# Patient Record
Sex: Male | Born: 1960
Health system: Southern US, Community
[De-identification: ages and names within clinical notes are randomized; demographics above are authoritative.]

## PROBLEM LIST (undated history)

## (undated) DIAGNOSIS — I4892 Unspecified atrial flutter: Secondary | ICD-10-CM

## (undated) DIAGNOSIS — I2510 Atherosclerotic heart disease of native coronary artery without angina pectoris: Secondary | ICD-10-CM

## (undated) DIAGNOSIS — I10 Essential (primary) hypertension: Secondary | ICD-10-CM

## (undated) DIAGNOSIS — I471 Supraventricular tachycardia, unspecified: Secondary | ICD-10-CM

## (undated) DIAGNOSIS — M549 Dorsalgia, unspecified: Secondary | ICD-10-CM

## (undated) DIAGNOSIS — K649 Unspecified hemorrhoids: Secondary | ICD-10-CM

## (undated) DIAGNOSIS — D369 Benign neoplasm, unspecified site: Secondary | ICD-10-CM

## (undated) DIAGNOSIS — M1990 Unspecified osteoarthritis, unspecified site: Secondary | ICD-10-CM

## (undated) DIAGNOSIS — K219 Gastro-esophageal reflux disease without esophagitis: Secondary | ICD-10-CM

## (undated) DIAGNOSIS — G8929 Other chronic pain: Secondary | ICD-10-CM

## (undated) DIAGNOSIS — I4891 Unspecified atrial fibrillation: Secondary | ICD-10-CM

## (undated) DIAGNOSIS — N183 Chronic kidney disease, stage 3 unspecified: Secondary | ICD-10-CM

## (undated) DIAGNOSIS — I425 Other restrictive cardiomyopathy: Secondary | ICD-10-CM

## (undated) DIAGNOSIS — A048 Other specified bacterial intestinal infections: Secondary | ICD-10-CM

## (undated) HISTORY — DX: Chronic kidney disease, stage 3 unspecified: N18.30

## (undated) HISTORY — DX: Supraventricular tachycardia: I47.1

## (undated) HISTORY — DX: Supraventricular tachycardia, unspecified: I47.10

## (undated) HISTORY — DX: Essential (primary) hypertension: I10

## (undated) HISTORY — PX: HERNIA REPAIR: SHX51

## (undated) HISTORY — DX: Atherosclerotic heart disease of native coronary artery without angina pectoris: I25.10

## (undated) HISTORY — DX: Other restrictive cardiomyopathy: I42.5

## (undated) HISTORY — DX: Unspecified atrial fibrillation: I48.91

## (undated) HISTORY — DX: Other specified bacterial intestinal infections: A04.8

## (undated) HISTORY — DX: Unspecified atrial flutter: I48.92

## (undated) HISTORY — DX: Benign neoplasm, unspecified site: D36.9

## (undated) HISTORY — PX: HEMORRHOID BANDING: SHX5850

## (undated) HISTORY — DX: Unspecified hemorrhoids: K64.9

---

## 2001-10-25 ENCOUNTER — Ambulatory Visit: Admission: RE | Admit: 2001-10-25 | Discharge: 2001-10-25 | Payer: Self-pay | Admitting: Internal Medicine

## 2007-10-25 ENCOUNTER — Ambulatory Visit: Payer: Self-pay | Admitting: Cardiology

## 2007-11-10 ENCOUNTER — Ambulatory Visit: Payer: Self-pay | Admitting: Cardiology

## 2008-01-23 ENCOUNTER — Inpatient Hospital Stay (HOSPITAL_COMMUNITY): Admission: AD | Admit: 2008-01-23 | Discharge: 2008-01-24 | Payer: Self-pay | Admitting: *Deleted

## 2008-01-23 ENCOUNTER — Ambulatory Visit: Payer: Self-pay | Admitting: *Deleted

## 2010-03-02 ENCOUNTER — Emergency Department (HOSPITAL_COMMUNITY): Admission: EM | Admit: 2010-03-02 | Discharge: 2010-03-02 | Payer: Self-pay | Admitting: Emergency Medicine

## 2010-06-09 ENCOUNTER — Encounter (HOSPITAL_COMMUNITY): Payer: Self-pay | Admitting: Radiology

## 2010-06-09 ENCOUNTER — Emergency Department (HOSPITAL_COMMUNITY): Payer: 59

## 2010-06-09 ENCOUNTER — Emergency Department (HOSPITAL_COMMUNITY)
Admission: EM | Admit: 2010-06-09 | Discharge: 2010-06-09 | Disposition: A | Discharge status: Home / Self Care | Payer: 59 | Attending: Emergency Medicine | Admitting: Emergency Medicine

## 2010-06-09 DIAGNOSIS — R0989 Other specified symptoms and signs involving the circulatory and respiratory systems: Secondary | ICD-10-CM | POA: Insufficient documentation

## 2010-06-09 DIAGNOSIS — R0789 Other chest pain: Secondary | ICD-10-CM | POA: Insufficient documentation

## 2010-06-09 DIAGNOSIS — R0609 Other forms of dyspnea: Secondary | ICD-10-CM | POA: Insufficient documentation

## 2010-06-09 DIAGNOSIS — J40 Bronchitis, not specified as acute or chronic: Secondary | ICD-10-CM | POA: Insufficient documentation

## 2010-06-09 DIAGNOSIS — R05 Cough: Secondary | ICD-10-CM | POA: Insufficient documentation

## 2010-06-09 DIAGNOSIS — R059 Cough, unspecified: Secondary | ICD-10-CM | POA: Insufficient documentation

## 2010-06-09 LAB — CK TOTAL AND CKMB (NOT AT ARMC)
CK, MB: 1 ng/mL (ref 0.3–4.0)
Relative Index: 0.5 (ref 0.0–2.5)
Total CK: 186 U/L (ref 7–232)

## 2010-06-09 LAB — BASIC METABOLIC PANEL
BUN: 16 mg/dL (ref 6–23)
CO2: 27 mEq/L (ref 19–32)
Calcium: 9.7 mg/dL (ref 8.4–10.5)
Chloride: 100 mEq/L (ref 96–112)
Creatinine, Ser: 1.29 mg/dL (ref 0.4–1.5)
GFR calc Af Amer: >60 mL/min (ref >60)
GFR calc non Af Amer: 59 mL/min — ABNORMAL LOW (ref >60)
Glucose, Bld: 98 mg/dL (ref 70–99)
Potassium: 4.2 mEq/L (ref 3.5–5.1)
Sodium: 137 mEq/L (ref 135–145)

## 2010-06-09 LAB — DIFFERENTIAL
Basophils Absolute: 0 10*3/uL (ref 0.0–0.1)
Basophils Relative: 0 % (ref 0–1)
Eosinophils Absolute: 0.3 10*3/uL (ref 0.0–0.7)
Eosinophils Relative: 4 % (ref 0–5)
Lymphocytes Relative: 31 % (ref 12–46)
Lymphs Abs: 1.9 10*3/uL (ref 0.7–4.0)
Monocytes Absolute: 0.5 10*3/uL (ref 0.1–1.0)
Monocytes Relative: 8 % (ref 3–12)
Neutro Abs: 3.5 10*3/uL (ref 1.7–7.7)
Neutrophils Relative %: 57 % (ref 43–77)

## 2010-06-09 LAB — CBC
HCT: 44.7 % (ref 39.0–52.0)
Hemoglobin: 15 g/dL (ref 13.0–17.0)
MCH: 29.9 pg (ref 26.0–34.0)
MCHC: 33.6 g/dL (ref 30.0–36.0)
MCV: 89.2 fL (ref 78.0–100.0)
Platelets: 298 10*3/uL (ref 150–400)
RBC: 5.01 MIL/uL (ref 4.22–5.81)
RDW: 15.3 % (ref 11.5–15.5)
WBC: 6.2 10*3/uL (ref 4.0–10.5)

## 2010-06-09 LAB — TROPONIN I: Troponin I: 0.01 ng/mL (ref 0.00–0.06)

## 2010-06-23 ENCOUNTER — Emergency Department (HOSPITAL_COMMUNITY)
Admission: EM | Admit: 2010-06-23 | Discharge: 2010-06-23 | Disposition: A | Discharge status: Home / Self Care | Payer: 59 | Attending: Emergency Medicine | Admitting: Emergency Medicine

## 2010-06-23 DIAGNOSIS — E785 Hyperlipidemia, unspecified: Secondary | ICD-10-CM | POA: Insufficient documentation

## 2010-06-23 DIAGNOSIS — L909 Atrophic disorder of skin, unspecified: Secondary | ICD-10-CM | POA: Insufficient documentation

## 2010-06-23 DIAGNOSIS — L919 Hypertrophic disorder of the skin, unspecified: Secondary | ICD-10-CM | POA: Insufficient documentation

## 2010-06-23 DIAGNOSIS — K625 Hemorrhage of anus and rectum: Secondary | ICD-10-CM | POA: Insufficient documentation

## 2010-06-23 DIAGNOSIS — M549 Dorsalgia, unspecified: Secondary | ICD-10-CM | POA: Insufficient documentation

## 2010-06-23 DIAGNOSIS — G8929 Other chronic pain: Secondary | ICD-10-CM | POA: Insufficient documentation

## 2010-06-23 LAB — POCT I-STAT, CHEM 8
BUN: 13 mg/dL (ref 6–23)
Calcium, Ion: 1.12 mmol/L (ref 1.12–1.32)
Chloride: 108 mEq/L (ref 96–112)
Creatinine, Ser: 1.5 mg/dL (ref 0.4–1.5)
Glucose, Bld: 82 mg/dL (ref 70–99)
HCT: 44 % (ref 39.0–52.0)
Hemoglobin: 15 g/dL (ref 13.0–17.0)
Potassium: 4.1 mEq/L (ref 3.5–5.1)
Sodium: 141 mEq/L (ref 135–145)
TCO2: 23 mmol/L (ref 0–100)

## 2010-06-24 LAB — COMPREHENSIVE METABOLIC PANEL
ALT: 20 U/L (ref 0–53)
AST: 32 U/L (ref 0–37)
Albumin: 3.7 g/dL (ref 3.5–5.2)
Alkaline Phosphatase: 50 U/L (ref 39–117)
BUN: 10 mg/dL (ref 6–23)
CO2: 25 mEq/L (ref 19–32)
Calcium: 9.1 mg/dL (ref 8.4–10.5)
Chloride: 105 mEq/L (ref 96–112)
Creatinine, Ser: 1.26 mg/dL (ref 0.4–1.5)
GFR calc Af Amer: >60 mL/min (ref >60)
GFR calc non Af Amer: >60 mL/min (ref >60)
Glucose, Bld: 111 mg/dL — ABNORMAL HIGH (ref 70–99)
Potassium: 3.7 mEq/L (ref 3.5–5.1)
Sodium: 139 mEq/L (ref 135–145)
Total Bilirubin: 0.5 mg/dL (ref 0.3–1.2)
Total Protein: 7.3 g/dL (ref 6.0–8.3)

## 2010-06-24 LAB — CBC
HCT: 41.5 % (ref 39.0–52.0)
Hemoglobin: 13.9 g/dL (ref 13.0–17.0)
MCH: 30.6 pg (ref 26.0–34.0)
MCHC: 33.5 g/dL (ref 30.0–36.0)
MCV: 91.2 fL (ref 78.0–100.0)
Platelets: 244 10*3/uL (ref 150–400)
RBC: 4.55 MIL/uL (ref 4.22–5.81)
RDW: 15 % (ref 11.5–15.5)
WBC: 6 10*3/uL (ref 4.0–10.5)

## 2010-06-24 LAB — DIFFERENTIAL
Basophils Absolute: 0.1 10*3/uL (ref 0.0–0.1)
Basophils Relative: 1 % (ref 0–1)
Eosinophils Absolute: 0.3 10*3/uL (ref 0.0–0.7)
Eosinophils Relative: 5 % (ref 0–5)
Lymphocytes Relative: 30 % (ref 12–46)
Lymphs Abs: 1.8 10*3/uL (ref 0.7–4.0)
Monocytes Absolute: 0.5 10*3/uL (ref 0.1–1.0)
Monocytes Relative: 8 % (ref 3–12)
Neutro Abs: 3.4 10*3/uL (ref 1.7–7.7)
Neutrophils Relative %: 56 % (ref 43–77)

## 2010-06-24 LAB — URINALYSIS, ROUTINE W REFLEX MICROSCOPIC
Bilirubin Urine: NEGATIVE
Glucose, UA: NEGATIVE mg/dL
Hgb urine dipstick: NEGATIVE
Ketones, ur: NEGATIVE mg/dL
Nitrite: NEGATIVE
Protein, ur: NEGATIVE mg/dL
Specific Gravity, Urine: 1.01 (ref 1.005–1.030)
Urobilinogen, UA: 0.2 mg/dL (ref 0.0–1.0)
pH: 6.5 (ref 5.0–8.0)

## 2010-06-27 LAB — POCT I-STAT, CHEM 8
BUN: 13 mg/dL (ref 6–23)
Calcium, Ion: 1.15 mmol/L (ref 1.12–1.32)
Chloride: 108 mEq/L (ref 96–112)
Creatinine, Ser: 1.5 mg/dL (ref 0.4–1.5)
Glucose, Bld: 89 mg/dL (ref 70–99)
HCT: 44 % (ref 39.0–52.0)
Hemoglobin: 15 g/dL (ref 13.0–17.0)
Potassium: 4.1 mEq/L (ref 3.5–5.1)
Sodium: 140 mEq/L (ref 135–145)
TCO2: 24 mmol/L (ref 0–100)

## 2010-06-30 ENCOUNTER — Ambulatory Visit (INDEPENDENT_AMBULATORY_CARE_PROVIDER_SITE_OTHER): Payer: Commercial Indemnity | Admitting: Urgent Care

## 2010-06-30 ENCOUNTER — Encounter: Payer: Self-pay | Admitting: Urgent Care

## 2010-06-30 DIAGNOSIS — K625 Hemorrhage of anus and rectum: Secondary | ICD-10-CM | POA: Insufficient documentation

## 2010-06-30 DIAGNOSIS — K219 Gastro-esophageal reflux disease without esophagitis: Secondary | ICD-10-CM

## 2010-06-30 DIAGNOSIS — R1319 Other dysphagia: Secondary | ICD-10-CM | POA: Insufficient documentation

## 2010-07-10 NOTE — Assessment & Plan Note (Signed)
Summary: rectal bleed,consult for tcs   Vital Signs:  Patient profile:   50 year old male Height:      66 inches Weight:      252.50 pounds BMI:     40.90 Temp:     98.6 degrees F oral Pulse rate:   84 / minute BP sitting:   112 / 58  (left arm)  Vitals Entered By: Carolan Clines LPN (June 30, 2010 11:17 AM)  Visit Type:  Initial Consult Referring Provider:  Tempie Donning, [facility] Primary Care Provider:  [place]  Chief Complaint:  rectal bleeding.  History of Present Illness: 50 y/o black male w/ light red blood in small amt of stool & on toilet paper x 2days last week.  Also had similar  episode 2 mo ago.  Occ incomplete evacuation.  Denies constipation or diarrhea.  Aleve once daily as needed.  Takes baby ASA daily.  c/o hiccoughs, heartburn, indigestion w/ nocturnal symptoms x 1 yr.  Started prilosec last week, some difference.  Denies nausea, vomiting.  Wt gain a few #'s.  c/o pill dysphagia x 3-4 mo.  Feels like stuck in upper esophagus.  Denies any problems w/ liqs.    Current Medications (verified): 1)  Aspir-Low 81 Mg Tbec (Aspirin) .... Take One Once Daily 2)  Exforge Hct 10-160-12.5 Mg Tabs (Amlodipine-Valsartan-Hctz) .... Take One Once Daily 3)  Tricor 48 Mg Tabs (Fenofibrate) .... Take One Once Daily 4)  Prilosec 20 Mg Cpdr (Omeprazole) .... Take One Once Daily  Allergies (verified): No Known Drug Allergies  Past History:  Past Medical History: htn  Past Surgical History: Unremarkable  Family History: No known family history of colorectal carcinoma, IBD, liver or chronic GI problems. Father: (deceased 19)  ALS Mother: (49's)?  Siblings: 7 -healthy  Social History: engaged to be married 07/2010 divorced, married x 1 no children [employer] Patient has never smoked.  Alcohol Use - yes, 1-2 beers/day, occ liquor 2x/week Illicit Drug Use - past marijuana, none in 1 mo Smoking Status:  never Drug Use:  yes  Review of Systems General:  Denies fever, chills,  sweats, anorexia, fatigue, weakness, malaise, weight loss, and sleep disorder. CV:  Denies chest pains, angina, palpitations, syncope, dyspnea on exertion, orthopnea, PND, peripheral edema, and claudication. Resp:  Complains of dyspnea with exercise; denies dyspnea at rest, cough, sputum, wheezing, coughing up blood, and pleurisy. GI:  Denies jaundice and fecal incontinence. GU:  Complains of urinary frequency; denies urinary burning, blood in urine, urinary hesitancy, nocturnal urination, and urinary incontinence. MS:  Complains of low back pain. Derm:  Denies rash, itching, dry skin, hives, moles, warts, and unhealing ulcers. Psych:  Denies depression, anxiety, memory loss, suicidal ideation, hallucinations, paranoia, phobia, and confusion. Heme:  Denies bruising and enlarged lymph nodes.  Physical Exam  General:  Obese, Well developed, no acute distress. Head:  Normocephalic and atraumatic. Eyes:  Sclera clear,  no icterus. Ears:  Normal auditory acuity. Nose:  No deformity, discharge,  or lesions. Mouth:  No deformity or lesions, dentition normal. Neck:  Supple; no masses or thyromegaly. Lungs:  Clear throughout to auscultation. Heart:  Regular rate and rhythm; no murmurs, rubs,  or bruits. Abdomen:  Soft, nontender and nondistended. No masses, hepatosplenomegaly or hernias noted. Normal bowel sounds. no rebound tenderness or guarding. Rectal:  deferred until time of colonoscopy.   Msk:  Symmetrical with no gross deformities. Normal posture. Pulses:  Normal pulses noted. Extremities:  No clubbing, cyanosis, edema or deformities noted.  Neurologic:  Alert and  oriented x4;  grossly normal neurologically. Skin:  Intact without significant lesions or rashes. Cervical Nodes:  No significant cervical adenopathy. Psych:  Alert and cooperative. Normal mood and affect.   Impression & Recommendations:  Problem # 1:  RECTAL BLEEDING (ICD-41.79)  50 year old black male with intermittent  small volume hematochezia.    He will need colonoscopy to further sort things out.  Differentials include colorectal carcinoma, diverticular bleeding, benign anorectal source, such as hemorrhoid or fissure.   He is going to be scheduled for colonoscopy and EGD with possible esophageal dilatation with Dr. Jena Gauss in the OR given his daily alcohol use.I have discussed this procedure including risk and benefits, which include but are not limited to, bleeding, infection, perforation, and drug reaction.   He agrees with this plan and consent will be obtained. Orders: Consultation Level IV (16109)  Problem # 2:  GERD (ICD-530.81) New onset acid reflux in a 50 year old black male.   This is accompanied by pill-dysphasia. He is going to need further evaluation with EGD to rule out esophageal web, ring, or stricture or complicated gastroesophageal reflux disease.  Problem # 3:  OTHER DYSPHAGIA (ICD-787.29) See #2  Patient Instructions: 1)   continue Prilosec 20 mg daily  2)   To ER if you have severe abdominal pain, bleeding, or any worsening  symptoms.   Orders Added: 1)  Consultation Level IV [60454]

## 2010-07-16 ENCOUNTER — Encounter (HOSPITAL_COMMUNITY): Payer: 59 | Attending: Internal Medicine

## 2010-07-23 ENCOUNTER — Encounter: Payer: 59 | Admitting: Internal Medicine

## 2010-07-29 ENCOUNTER — Emergency Department (HOSPITAL_COMMUNITY): Payer: 59

## 2010-07-29 ENCOUNTER — Emergency Department (HOSPITAL_COMMUNITY)
Admission: EM | Admit: 2010-07-29 | Discharge: 2010-07-29 | Disposition: A | Discharge status: Home / Self Care | Payer: 59 | Attending: Emergency Medicine | Admitting: Emergency Medicine

## 2010-07-29 DIAGNOSIS — R0989 Other specified symptoms and signs involving the circulatory and respiratory systems: Secondary | ICD-10-CM | POA: Insufficient documentation

## 2010-07-29 DIAGNOSIS — R059 Cough, unspecified: Secondary | ICD-10-CM | POA: Insufficient documentation

## 2010-07-29 DIAGNOSIS — R05 Cough: Secondary | ICD-10-CM | POA: Insufficient documentation

## 2010-07-29 DIAGNOSIS — H6500 Acute serous otitis media, unspecified ear: Secondary | ICD-10-CM | POA: Insufficient documentation

## 2010-07-29 DIAGNOSIS — H9209 Otalgia, unspecified ear: Secondary | ICD-10-CM | POA: Insufficient documentation

## 2010-08-06 ENCOUNTER — Telehealth: Payer: Self-pay | Admitting: Internal Medicine

## 2010-08-06 ENCOUNTER — Encounter (HOSPITAL_COMMUNITY): Payer: 59 | Attending: Internal Medicine

## 2010-08-06 NOTE — Telephone Encounter (Addendum)
Gastroenterology Pre-Procedure Form  Request Date: 08/06/2010,  Requesting Physician:      PATIENT INFORMATION:  Calvin Henry is a 50 y.o., male (DOB=1960/11/13).  PROCEDURE: Procedure(s) requested: colonoscopy,EGD Procedure Reason: rectal bleeding,DYSPHAGIA,GERD  PATIENT REVIEW QUESTIONS: The patient reports the following:   1. Diabetes Melitis: no 2. Joint replacements in the past 12 months: no 3. Major health problems in the past 3 months: no 4. Has an artificial valve or MVP:no 5. Has been advised in past to take antibiotics in advance of a procedure like teeth cleaning: no}    MEDICATIONS & ALLERGIES:    Patient reports the following regarding taking any blood thinners:   Plavix? no Aspirin?yes 81 MG Coumadin?  no  Patient confirms/reports the following medications:  Current Outpatient Prescriptions  Medication Sig Dispense Refill  . Amlodipine-Valsartan-HCTZ (EXFORGE HCT) 10-160-12.5 MG TABS Take by mouth daily.        Marland Kitchen aspirin 81 MG tablet Take 81 mg by mouth daily.        . fenofibrate (TRICOR) 48 MG tablet Take 48 mg by mouth daily.        Marland Kitchen omeprazole (PRILOSEC) 20 MG capsule Take 20 mg by mouth daily.          Patient confirms/reports the following allergies:  No Known Allergies  Patient is appropriate to schedule for requested procedure(s): yes     SCHEDULE INFORMATION: Procedure has been scheduled as follows:  Date: 08/07/2010  Location: [facility]  This Gastroenterology Pre-Precedure Form is being routed to the following provider(s) for review: Tana Coast, PA

## 2010-08-06 NOTE — Telephone Encounter (Signed)
Okay to proceed with EGD and colonoscopy. No change in medications.  Please note procedures are to be done with deep sedation in the OR due to history of daily alcohol abuse.

## 2010-08-07 ENCOUNTER — Other Ambulatory Visit: Payer: Self-pay | Admitting: Internal Medicine

## 2010-08-07 ENCOUNTER — Ambulatory Visit (HOSPITAL_COMMUNITY)
Admission: RE | Admit: 2010-08-07 | Discharge: 2010-08-07 | Disposition: A | Discharge status: Home / Self Care | Payer: 59 | Source: Ambulatory Visit | Attending: Internal Medicine | Admitting: Internal Medicine

## 2010-08-07 ENCOUNTER — Encounter: Payer: Self-pay | Admitting: Internal Medicine

## 2010-08-07 DIAGNOSIS — Z79899 Other long term (current) drug therapy: Secondary | ICD-10-CM | POA: Insufficient documentation

## 2010-08-07 DIAGNOSIS — K222 Esophageal obstruction: Secondary | ICD-10-CM

## 2010-08-07 DIAGNOSIS — K921 Melena: Secondary | ICD-10-CM | POA: Insufficient documentation

## 2010-08-07 DIAGNOSIS — I10 Essential (primary) hypertension: Secondary | ICD-10-CM | POA: Insufficient documentation

## 2010-08-07 DIAGNOSIS — K2940 Chronic atrophic gastritis without bleeding: Secondary | ICD-10-CM | POA: Insufficient documentation

## 2010-08-07 DIAGNOSIS — Z7982 Long term (current) use of aspirin: Secondary | ICD-10-CM | POA: Insufficient documentation

## 2010-08-07 DIAGNOSIS — R1310 Dysphagia, unspecified: Secondary | ICD-10-CM | POA: Insufficient documentation

## 2010-08-07 HISTORY — PX: ESOPHAGOGASTRODUODENOSCOPY: SHX1529

## 2010-08-07 HISTORY — PX: COLONOSCOPY: SHX174

## 2010-08-14 NOTE — Op Note (Signed)
Calvin Henry, FURCHES            ACCOUNT NO.:  [id number]  MEDICAL RECORD NO.:  [id number]           PATIENT TYPE:  O  LOCATION:  DAYP                          FACILITY:  [facility]  PHYSICIAN:  R. Roetta Sessions, M.D. DATE OF BIRTH:  1960-08-02  DATE OF PROCEDURE:  08/07/2010 DATE OF DISCHARGE:  08/07/2010                              OPERATIVE REPORT   INDICATIONS FOR PROCEDURE:  A 50 year old African American male with intermittent hematochezia, longstanding GERD, and intermittent esophageal dysphagia to solids.  EGD and colonoscopy now being done. Risks, benefits, limitations, alternatives, imponderables have been discussed, questions answered.  He will require deep sedation with propofol.  Please see his history and physical exam for more information.  PROCEDURE NOTE:  O2 saturation, blood pressure, pulse, respirations monitored throughout the entire procedure.  Deep sedation per Dr. Jayme Cloud and associates.  Cetacaine spray for topical pharyngeal anesthesia.  INSTRUMENT:  Pentax video chip system.  EGD FINDINGS:  Examination of the tubular esophagus revealed a Schatzki ring, otherwise esophageal mucosa appeared normal.  EG junction easily traversed.  Examination of the stomach:  Gas cavity was emptied, insufflated well with air.  Thorough examination of the gastric mucosa including retroflexion in the proximal stomach and esophagogastric junction demonstrated small hiatal hernia and submucosal petechiae. There was no ulcer infiltrating process or other abnormality.  Pylorus is patent, easily traversed.  Examination of the bulb and second portion revealed no abnormalities.  THERAPEUTIC/DIAGNOSTIC MANEUVERS PERFORMED:  Scope was withdrawn and a 56-French Maloney dilator was passed in full insertion with ease look back revealed no apparent complication with no passage of the dilator. Subsequent biopsies of the stomach were taken for histologic study to rule out H. Pylori.  The  patient tolerated the procedure and was prepared for colonoscopy.  Digital rectal exam revealed no abnormalities.  ENDOSCOPIC FINDINGS:  Prep was adequate.  Colon:  Colonic mucosa was surveyed from the rectosigmoid junction through the left transverse right colon to the appendiceal orifice, ileocecal valve/cecum.  These structures were well seen and photographed for the record.  From this level, scope was slowly and cautiously withdrawn.  All previously mentioned mucosal surfaces were again seen.  The colonic mucosa appeared normal.  Scope was pulled down to the rectum where a thorough examination of rectal mucosa including retroflexed view of the anal verge demonstrated no abnormalities.  An en face view of the anal canal did demonstrate friable anal canal only.  The patient tolerated this procedure well also with a cecal withdrawal time 13 minutes.  IMPRESSION: 1. EGD and Schatzki ring, otherwise normal esophagus status post     dilation as described above. 2. Small hiatal hernia. 3. Diffuse submucosal gastric petechiae of uncertain significance     status post biopsy, patent pylorus, normal D1-D2.  COLONOSCOPY FINDINGS:  Friable anal canal, otherwise normal rectum and colon.  RECOMMENDATIONS: 1. Acid suppression therapy. 2. Follow up on path. 3. Further recommendations to follow.     [name], M.D.     RMR/MEDQ  D:  08/13/2010  T:  08/13/2010  Job:  [id number]  Electronically Signed by Lorrin Goodell M.D. on 08/14/2010 03:48:34 PM

## 2010-08-18 ENCOUNTER — Emergency Department (HOSPITAL_COMMUNITY): Payer: 59

## 2010-08-18 ENCOUNTER — Emergency Department (HOSPITAL_COMMUNITY)
Admission: EM | Admit: 2010-08-18 | Discharge: 2010-08-18 | Disposition: A | Discharge status: Home / Self Care | Payer: 59 | Attending: Emergency Medicine | Admitting: Emergency Medicine

## 2010-08-18 DIAGNOSIS — R002 Palpitations: Secondary | ICD-10-CM | POA: Insufficient documentation

## 2010-08-18 DIAGNOSIS — R079 Chest pain, unspecified: Secondary | ICD-10-CM | POA: Insufficient documentation

## 2010-08-18 DIAGNOSIS — E785 Hyperlipidemia, unspecified: Secondary | ICD-10-CM | POA: Insufficient documentation

## 2010-08-18 DIAGNOSIS — K219 Gastro-esophageal reflux disease without esophagitis: Secondary | ICD-10-CM | POA: Insufficient documentation

## 2010-08-18 DIAGNOSIS — M549 Dorsalgia, unspecified: Secondary | ICD-10-CM | POA: Insufficient documentation

## 2010-08-18 LAB — POCT I-STAT, CHEM 8
BUN: 15 mg/dL (ref 6–23)
Calcium, Ion: 1 mmol/L — ABNORMAL LOW (ref 1.12–1.32)
Chloride: 109 mEq/L (ref 96–112)
Creatinine, Ser: 1.3 mg/dL (ref 0.4–1.5)
Glucose, Bld: 91 mg/dL (ref 70–99)
HCT: 44 % (ref 39.0–52.0)
Hemoglobin: 15 g/dL (ref 13.0–17.0)
Potassium: 3.9 mEq/L (ref 3.5–5.1)
Sodium: 139 mEq/L (ref 135–145)
TCO2: 25 mmol/L (ref 0–100)

## 2010-08-18 LAB — POCT CARDIAC MARKERS
CKMB, poc: <1 ng/mL — ABNORMAL LOW (ref 1.0–8.0)
Myoglobin, poc: 76.2 ng/mL (ref 12–200)
Troponin i, poc: <0.05 ng/mL (ref 0.00–0.09)

## 2010-08-26 NOTE — Assessment & Plan Note (Signed)
[facility]                          [facility] OFFICE NOTE   Calvin Henry, Calvin Henry                     MRN:          [id number]  DATE:11/10/2007                            DOB:          06/24/1960    PRIMARY CARE PHYSICIAN:  Dr. Lia Hopping.   REASON FOR VISIT:  Followup cardiac testing.   HISTORY OF PRESENT ILLNESS:  Calvin Henry is a 50 year old gentleman  with a history of hypertension who was admitted to [facility] back in mid July with chest discomfort.  He was ruled out from  myocardial infarction and we saw him in consultation with  recommendations for a followup exercise echocardiogram.  This procedure  was done on October 27, 2007 and read by Dr. Myrtis Ser revealing no  electrocardiographic changes to indicate ischemia and no inducible wall  motion abnormalities to indicate ischemia.  I have reviewed this with  him today.  He states that overall he has done fairly well.  His  electrocardiogram shows sinus rhythm with left ventricle hypertrophy and  decreased R-wave progression anteriorly.  His blood pressure looks very  good today.  I spoke with him about general risk factor modifications  and he will continue to follow up with Dr. Olena Leatherwood.   ALLERGIES:  No known drug allergies.   PRESENT MEDICATIONS:  1. Exforge 10/160 mg p.o. daily.  2. Aspirin 325 mg p.o. daily.   REVIEW OF SYSTEMS:  As described in the history of present illness,  otherwise negative.   PHYSICAL EXAMINATION:  VITAL SIGNS:  Blood pressure 120/74, heart rate  is 70, and weight is 242 pounds.  NECK:  Supple.  No elevated jugular venous pressure.  LUNGS:  Clear without labored breathing.  CARDIAC:  Regular rate and rhythm.  No loud murmur or gallop.   IMPRESSION AND RECOMMENDATIONS:  Normal exercise echocardiogram  suggesting very low likelihood of underlying obstructive cardiovascular  disease.  We would recommend general risk factor modification  including  control of blood pressure and follow up of lipid status.  He will  continue to follow with Dr. Olena Leatherwood and we can see him back as needed.     Jonelle Sidle, MD  Electronically Signed   SGM/MedQ  DD: 11/10/2007  DT: 11/11/2007  Job #: [id number]   cc:   Lia Hopping

## 2010-08-26 NOTE — Discharge Summary (Signed)
NAMEJENS, SIEMS NO.:  [id number]   MEDICAL RECORD NO.:  [id number]          PATIENT TYPE:  IPS   LOCATION:  0304                          FACILITY:  [facility]   PHYSICIAN:  Jasmine Pang, M.D. DATE OF BIRTH:  1960-11-12   DATE OF ADMISSION:  01/23/2008  DATE OF DISCHARGE:  01/24/2008                               DISCHARGE SUMMARY   IDENTIFYING INFORMATION:  This is a 50 year old single African American  male who was admitted on a voluntary basis on January 23, 2008.   HISTORY OF PRESENT ILLNESS:  The patient states he took an overdose of 4  to 5 pills of his MOBIC.  He then told the emergency department that he  had taken 15 to 20.  He stated this was real crazy.  He had a number  of stressors and had been trying to borrow money from his sister.  He  took the pills out of anger towards her when she was unable to lend him  some money.  Other stressors include losing a job though he just got a  job back, which does not pay as well.  He is having financial issues.  He states that he had one beer the day he took the pills, but no more.  Sleep is decreased.  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   PAST PSYCHIATRIC HISTORY:  This is the first [facility] inpatient admission for  the patient.  He has no current psychiatric problems.   ALCOHOL AND DRUG HISTORY:  Occasional beer.  He denies any other  substance abuse.   MEDICAL PROBLEMS:  Hypertension.   MEDICATIONS:  Exforge.     The patient's physical exam from [facility] was reviewed.  There were no acute physical or medical problems noted except for some  abdominal tenderness and distention.   ADMISSION LABORATORY:  CBC was within normal limits.  Urinalysis was  negative.  Alcohol level was 140.  Salicylate level less than 4.  Acetaminophen level less than 10.   HOSPITAL COURSE:  Upon admission, the patient was restarted on Exforge  10/320 mg 1 tablet daily.  He states he was very embarrassed about  having taken the overdose.  He denies he is suicidal and states he never  was.  He wanted to draw attention to the fact that he needed financial  help.  He took only 4 to 5 MOBIC pills, but told the emergency  department that he had taken 15 to 20.  He stated he was doing this to  get attention.  He discussed his multiple stressors including financial  problems.  He also recently gotten laid off, but was just called back to  work, which he feels better about.  He did not want to stay in the  hospital.  His sister was contacted and she did not feel he needed to be  in the hospital either.  She did not feel he was a danger to himself.  Mood was less depressed, less anxious.  Affect consistent with mood.  There was  no suicidal or homicidal ideation.  No thoughts of self-  injurious behavior.  No auditory or visual hallucinations.  No paranoia  or delusions.  Thoughts were logical and goal-directed, thought content.  No predominant theme.  Cognitive was grossly intact.  Insight good,  judgment good, impulse control good.  It was felt the patient was safe  for discharge today and his family agreed.   DISCHARGE DIAGNOSES:  Axis I:  Depressive disorder not otherwise  specified.  Axis II:  None.  Axis III:  Hypertension.  Axis IV:  Severe (problems related to social environment, occupational  problem, economic problem, and burden of psychiatric illness).  Axis V:  Global assessment of functioning was 50 upon discharge.  GAF  was 45 upon admission.  GAF highest past year was 70.   DISCHARGE PLAN:  There was no specific activity level or dietary  restrictions.   POSTHOSPITAL CARE PLANS:  The patient did not want any psychiatric  followup.   DISCHARGE MEDICATIONS:  Exforge 10/320 mg 1 tablets daily.  He is also  to follow up with his primary care physician, Dr. Bradly Bienenstock about resuming  his Ultram.      Jasmine Pang,  M.D.  Electronically Signed     BHS/MEDQ  D:  01/24/2008  T:  01/25/2008  Job:  [id number]

## 2010-09-11 ENCOUNTER — Other Ambulatory Visit (HOSPITAL_COMMUNITY): Payer: Self-pay | Admitting: Family Medicine

## 2010-09-11 DIAGNOSIS — M25569 Pain in unspecified knee: Secondary | ICD-10-CM

## 2010-09-15 ENCOUNTER — Ambulatory Visit (HOSPITAL_COMMUNITY)
Admission: RE | Admit: 2010-09-15 | Discharge: 2010-09-15 | Disposition: A | Discharge status: Home / Self Care | Payer: 59 | Source: Ambulatory Visit | Attending: Family Medicine | Admitting: Family Medicine

## 2010-09-15 DIAGNOSIS — M25569 Pain in unspecified knee: Secondary | ICD-10-CM

## 2010-09-15 DIAGNOSIS — M79609 Pain in unspecified limb: Secondary | ICD-10-CM | POA: Insufficient documentation

## 2011-04-21 ENCOUNTER — Emergency Department (HOSPITAL_COMMUNITY)
Admission: EM | Admit: 2011-04-21 | Discharge: 2011-04-21 | Disposition: A | Discharge status: Home / Self Care | Payer: 59

## 2011-04-21 ENCOUNTER — Encounter (HOSPITAL_COMMUNITY): Payer: Self-pay | Admitting: *Deleted

## 2011-04-21 NOTE — ED Notes (Signed)
Rt flank pain.  Seen by MD for same .

## 2011-04-21 NOTE — ED Notes (Signed)
Pt left w/o being seen

## 2011-04-22 ENCOUNTER — Encounter (HOSPITAL_COMMUNITY): Payer: Self-pay | Admitting: Emergency Medicine

## 2011-04-22 ENCOUNTER — Emergency Department (HOSPITAL_COMMUNITY): Payer: 59

## 2011-04-22 ENCOUNTER — Emergency Department (HOSPITAL_COMMUNITY)
Admission: EM | Admit: 2011-04-22 | Discharge: 2011-04-22 | Disposition: A | Discharge status: Home / Self Care | Payer: 59 | Attending: Emergency Medicine | Admitting: Emergency Medicine

## 2011-04-22 DIAGNOSIS — X58XXXA Exposure to other specified factors, initial encounter: Secondary | ICD-10-CM | POA: Insufficient documentation

## 2011-04-22 DIAGNOSIS — R319 Hematuria, unspecified: Secondary | ICD-10-CM | POA: Insufficient documentation

## 2011-04-22 DIAGNOSIS — I10 Essential (primary) hypertension: Secondary | ICD-10-CM | POA: Insufficient documentation

## 2011-04-22 DIAGNOSIS — S335XXA Sprain of ligaments of lumbar spine, initial encounter: Secondary | ICD-10-CM | POA: Insufficient documentation

## 2011-04-22 DIAGNOSIS — M549 Dorsalgia, unspecified: Secondary | ICD-10-CM | POA: Insufficient documentation

## 2011-04-22 DIAGNOSIS — R109 Unspecified abdominal pain: Secondary | ICD-10-CM | POA: Insufficient documentation

## 2011-04-22 DIAGNOSIS — Z87891 Personal history of nicotine dependence: Secondary | ICD-10-CM | POA: Insufficient documentation

## 2011-04-22 DIAGNOSIS — K219 Gastro-esophageal reflux disease without esophagitis: Secondary | ICD-10-CM | POA: Insufficient documentation

## 2011-04-22 DIAGNOSIS — S39012A Strain of muscle, fascia and tendon of lower back, initial encounter: Secondary | ICD-10-CM

## 2011-04-22 DIAGNOSIS — G8929 Other chronic pain: Secondary | ICD-10-CM | POA: Insufficient documentation

## 2011-04-22 HISTORY — DX: Dorsalgia, unspecified: M54.9

## 2011-04-22 HISTORY — DX: Gastro-esophageal reflux disease without esophagitis: K21.9

## 2011-04-22 HISTORY — DX: Other chronic pain: G89.29

## 2011-04-22 LAB — URINALYSIS, ROUTINE W REFLEX MICROSCOPIC
Bilirubin Urine: NEGATIVE
Glucose, UA: NEGATIVE mg/dL
Hgb urine dipstick: NEGATIVE
Ketones, ur: NEGATIVE mg/dL
Leukocytes, UA: NEGATIVE
Nitrite: NEGATIVE
Protein, ur: NEGATIVE mg/dL
Specific Gravity, Urine: 1.015 (ref 1.005–1.030)
Urobilinogen, UA: 0.2 mg/dL (ref 0.0–1.0)
pH: 6 (ref 5.0–8.0)

## 2011-04-22 LAB — BASIC METABOLIC PANEL
BUN: 17 mg/dL (ref 6–23)
CO2: 28 mEq/L (ref 19–32)
Calcium: 9.9 mg/dL (ref 8.4–10.5)
Chloride: 105 mEq/L (ref 96–112)
Creatinine, Ser: 1.17 mg/dL (ref 0.50–1.35)
GFR calc Af Amer: 82 mL/min — ABNORMAL LOW (ref >90)
GFR calc non Af Amer: 71 mL/min — ABNORMAL LOW (ref >90)
Glucose, Bld: 101 mg/dL — ABNORMAL HIGH (ref 70–99)
Potassium: 4.5 mEq/L (ref 3.5–5.1)
Sodium: 139 mEq/L (ref 135–145)

## 2011-04-22 MED ORDER — KETOROLAC TROMETHAMINE 30 MG/ML IJ SOLN
30.0000 mg | Freq: Once | INTRAMUSCULAR | Status: AC
  Administered 2011-04-22: 30 mg via INTRAVENOUS
  Filled 2011-04-22: qty 1

## 2011-04-22 MED ORDER — NAPROXEN 500 MG PO TABS: 500.0000 mg | ORAL_TABLET | Freq: Two times a day (BID) | ORAL | Status: DC

## 2011-04-22 MED ORDER — SODIUM CHLORIDE 0.9 % IV SOLN
INTRAVENOUS | Status: DC
  Administered 2011-04-22: 1000 mL via INTRAVENOUS

## 2011-04-22 MED ORDER — CYCLOBENZAPRINE HCL 10 MG PO TABS: 10.0000 mg | ORAL_TABLET | Freq: Two times a day (BID) | ORAL | Status: AC | PRN

## 2011-04-22 NOTE — ED Notes (Addendum)
Patient c/o right flank pain x1 month. Denies any nausea or vomiting. Per patient seen PCP and had urine tested that show small trace of blood. Patient states "He didn't do anything about it though." Patient denies any pain with urination. Patient reports taking medication for constipation with results but denies any relief from pain. Last normal BM this morning per patient.

## 2011-04-22 NOTE — Discharge Instructions (Signed)

## 2011-04-22 NOTE — ED Provider Notes (Signed)
History     CSN: [id number]  Arrival date & time 04/22/11  0844   First MD Initiated Contact with Patient 04/22/11 [phone]      Chief Complaint  Patient presents with  . Flank Pain    (Consider location/radiation/quality/duration/timing/severity/associated sxs/prior treatment) Patient is a 51 y.o. male presenting with flank pain. The history is provided by the patient and the spouse.  Flank Pain This is a recurrent problem. The current episode started 1 to 4 weeks ago. The problem occurs intermittently. The problem has been gradually worsening. Pertinent negatives include no abdominal pain, arthralgias, chest pain, chills, congestion, fever, headaches, joint swelling, nausea, neck pain, numbness, rash, sore throat, vomiting or weakness. Associated symptoms comments: He reports recent history of having blood in his urine when tested by his physician.  He denies dysuria.. The symptoms are aggravated by twisting and bending (worse with certain positions). He has tried nothing for the symptoms.    Past Medical History  Diagnosis Date  . Hypertension   . Acid reflux   . Chronic back pain     History reviewed. No pertinent past surgical history.  Family History  Problem Relation Age of Onset  . Diabetes Mother   . Hypertension Mother     History  Substance Use Topics  . Smoking status: Former Smoker -- 0.5 packs/day for 10 years    Types: Cigarettes    Quit date: 04/22/1991  . Smokeless tobacco: Never Used  . Alcohol Use: 7.2 oz/week    12 Cans of beer per week      Review of Systems  Constitutional: Negative for fever and chills.  HENT: Negative for congestion, sore throat and neck pain.   Eyes: Negative.   Respiratory: Negative for chest tightness and shortness of breath.   Cardiovascular: Negative for chest pain.  Gastrointestinal: Negative for nausea, vomiting and abdominal pain.  Genitourinary: Positive for flank pain. Negative for dysuria, urgency and penile pain.    Musculoskeletal: Negative for joint swelling and arthralgias.  Skin: Negative.  Negative for rash and wound.  Neurological: Negative for dizziness, weakness, light-headedness, numbness and headaches.  Hematological: Negative.   Psychiatric/Behavioral: Negative.     Allergies  Review of patient's allergies indicates no known allergies.  Home Medications   Current Outpatient Rx  Name Route Sig Dispense Refill  . ACETAMINOPHEN 500 MG PO TABS Oral Take 1,000 mg by mouth every 6 (six) hours as needed. Pain    . AMLODIPINE BESYLATE-VALSARTAN 10-160 MG PO TABS Oral Take 1 tablet by mouth daily.      . ASPIRIN EC 81 MG PO TBEC Oral Take 81 mg by mouth daily.      Marland Kitchen FISH OIL PO Oral Take 1 capsule by mouth daily.      . OXYCODONE-ACETAMINOPHEN 5-325 MG PO TABS Oral Take 1 tablet by mouth every 4 (four) hours as needed. For pain     . CYCLOBENZAPRINE HCL 10 MG PO TABS Oral Take 1 tablet (10 mg total) by mouth 2 (two) times daily as needed for muscle spasms. 20 tablet 0  . NAPROXEN 500 MG PO TABS Oral Take 1 tablet (500 mg total) by mouth 2 (two) times daily. 20 tablet 0    BP 126/76  Pulse 65  Temp(Src) 98.1 F (36.7 C) (Oral)  Resp 15  Ht 5\' 6"  (1.676 m)  Wt 262 lb (118.842 kg)  BMI 42.29 kg/m2  SpO2 100%  Physical Exam  Nursing note and vitals reviewed. Constitutional: He is oriented  to person, place, and time. He appears well-developed and well-nourished.  HENT:  Head: Normocephalic and atraumatic.  Eyes: Conjunctivae are normal.  Neck: Normal range of motion.  Cardiovascular: Normal rate, regular rhythm, normal heart sounds and intact distal pulses.   Pulmonary/Chest: Effort normal and breath sounds normal. He has no wheezes. He has no rales.  Abdominal: Soft. Bowel sounds are normal. He exhibits no distension. There is no tenderness. There is no rebound.  Musculoskeletal: Normal range of motion. He exhibits no edema and no tenderness.       Arms:      Site of pain not  worsened with palpation.  Neurological: He is alert and oriented to person, place, and time.  Skin: Skin is warm and dry.  Psychiatric: He has a normal mood and affect.    ED Course  Procedures (including critical care time)  Labs Reviewed  BASIC METABOLIC PANEL - Abnormal; Notable for the following:    Glucose, Bld 101 (*)    GFR calc non Af Amer 71 (*)    GFR calc Af Amer 82 (*)    All other components within normal limits  URINALYSIS, ROUTINE W REFLEX MICROSCOPIC   Ct Abdomen Pelvis Wo Contrast  04/22/2011  *RADIOLOGY REPORT*  Clinical Data: Left flank pain, hematuria  CT ABDOMEN AND PELVIS WITHOUT CONTRAST  Technique:  Multidetector CT imaging of the abdomen and pelvis was performed following the standard protocol without intravenous contrast. Sagittal and coronal MPR images reconstructed from axial data set.  Comparison: None  Findings: Minimal atelectasis right lung base. No hydronephrosis, ureteral dilatation, or urinary tract calcification. Within limits of a exam lacking IV contrast, no focal abnormalities of the liver, spleen, pancreas, kidneys, or adrenal glands identified. Tiny umbilical hernia containing fat. Stomach and bowel loops grossly unremarkable for technique. No definite mass, adenopathy, free fluid or inflammatory process. No acute osseous findings. Degenerative disc disease changes with endplate spur formation and vacuum phenomenon at L4-L5 and L5-S1.  IMPRESSION: No acute intra abdominal or intrapelvic abnormalities identified by noncontrast CT. Tiny umbilical hernia containing fat.  Original Report Authenticated By: Lollie Marrow, M.D.     1. Lumbar strain       MDM  Patient states was supposed to be referred to urologist for further eval of his hematuria, but has not heard from pcp.    Advised to call pcp to get this referral started.  Will tx back pain with flexeril and naprosyn   Patient also referred to Dr Romeo Apple for further evaluation of his back pain.   Given name and #.     Candis Musa, [place] 04/23/11 2219

## 2011-04-24 NOTE — ED Provider Notes (Signed)
Medical screening examination/treatment/procedure(s) were performed by non-physician practitioner and as supervising physician I was immediately available for consultation/collaboration.  Nicoletta Dress. Colon Branch, MD 04/24/11 [phone]

## 2011-05-16 ENCOUNTER — Encounter (HOSPITAL_COMMUNITY): Payer: Self-pay | Admitting: *Deleted

## 2011-05-16 ENCOUNTER — Emergency Department (HOSPITAL_COMMUNITY)
Admission: EM | Admit: 2011-05-16 | Discharge: 2011-05-16 | Disposition: A | Discharge status: Home / Self Care | Payer: 59 | Attending: Emergency Medicine | Admitting: Emergency Medicine

## 2011-05-16 DIAGNOSIS — R109 Unspecified abdominal pain: Secondary | ICD-10-CM

## 2011-05-16 LAB — URINALYSIS, ROUTINE W REFLEX MICROSCOPIC
Bilirubin Urine: NEGATIVE
Glucose, UA: NEGATIVE mg/dL
Hgb urine dipstick: NEGATIVE
Ketones, ur: NEGATIVE mg/dL
Leukocytes, UA: NEGATIVE
Nitrite: NEGATIVE
Protein, ur: NEGATIVE mg/dL
Specific Gravity, Urine: 1.015 (ref 1.005–1.030)
Urobilinogen, UA: 0.2 mg/dL (ref 0.0–1.0)
pH: 6 (ref 5.0–8.0)

## 2011-05-16 MED ORDER — CYCLOBENZAPRINE HCL 10 MG PO TABS: 10.0000 mg | ORAL_TABLET | Freq: Three times a day (TID) | ORAL | Status: AC | PRN

## 2011-05-16 MED ORDER — TRAMADOL HCL 50 MG PO TABS: ORAL_TABLET | ORAL | Status: DC

## 2011-05-16 NOTE — ED Provider Notes (Signed)
Patient relates for the past 2 weeks she's had left lateral abdominal discomfort that hurts when he moves. He denies any injury such as falling or change in activity that could have hurt himself. He denies any cough or fever. Patient was seen on January 19 with right-sided pain and had a CT of his abdomen and pelvis showing is no renal stones.  A she is tender between his left lateral rib cage in his superior iliac wing that is tender to palpation and hurts when he moves.  Impression muscular strain.  Medical screening examination/treatment/procedure(s) were conducted as a shared visit with non-physician practitioner(s) and myself.  I personally evaluated the patient during the encounter Calvin Albe, MD, Franz Dell, MD 05/16/11 1016

## 2011-05-16 NOTE — ED Notes (Signed)
States pain to left flank. Worse with movement. Denies any other symptoms at this time.

## 2011-05-16 NOTE — Discharge Instructions (Signed)
Flank Pain Flank pain is pain in your side.  HOME CARE  Home care and treatment will depend on the cause of your pain.   Some medicines can help relieve the pain. Take all medicine as told by your doctor.   Tell your doctor about any changes in your pain.   Follow up with your doctor.  GET HELP RIGHT AWAY IF:   Your pain does not get better with medicine.   The pain gets worse.   You have belly (abdominal) pain.   You are short of breath.   You always feel sick to your stomach (nauseous).   You keep throwing up (vomiting).   You have puffiness (swelling) in your belly.   You pass out (faint).   You have a temperature by mouth above 102 F (38.9 C), not controlled by medicine.  MAKE SURE YOU:   Understand these instructions.   Will watch your condition.   Will get help right away if you are not doing well or get worse.  Document Released: 01/07/2008 Document Revised: 12/10/2010 Document Reviewed: 09/14/2009 [employer] Patient Information 2012 [employer]. 

## 2011-06-03 NOTE — ED Provider Notes (Signed)
History     CSN: [id number]  Arrival date & time 05/16/11  9629   First MD Initiated Contact with Patient 05/16/11 [phone]      Chief Complaint  Patient presents with  . Flank Pain    (Consider location/radiation/quality/duration/timing/severity/associated sxs/prior treatment) HPI Comments: Patient c/o persistent pain to his left flank area for 2 weeks.  Pain is worse with movement and certain positions.  He denies hematuria, dysuria or abdominal pain or vomiting.  He also denies any known injury   Patient is a 51 y.o. male presenting with flank pain. The history is provided by the patient. No language interpreter was used.  Flank Pain This is a new problem. The current episode started 1 to 4 weeks ago. The problem occurs constantly. The problem has been unchanged. Associated symptoms include myalgias. Pertinent negatives include no abdominal pain, change in bowel habit, fever, headaches, nausea, neck pain, sore throat, swollen glands, urinary symptoms, vomiting or weakness. The symptoms are aggravated by twisting (movement). He has tried acetaminophen for the symptoms. The treatment provided no relief.    Past Medical History  Diagnosis Date  . Hypertension   . Acid reflux   . Chronic back pain     History reviewed. No pertinent past surgical history.  Family History  Problem Relation Age of Onset  . Diabetes Mother   . Hypertension Mother     History  Substance Use Topics  . Smoking status: Former Smoker -- 0.5 packs/day for 10 years    Types: Cigarettes    Quit date: 04/22/1991  . Smokeless tobacco: Never Used  . Alcohol Use: 0.0 oz/week     Occ      Review of Systems  Constitutional: Negative for fever and activity change.  HENT: Negative for sore throat and neck pain.   Gastrointestinal: Negative for nausea, vomiting, abdominal pain and change in bowel habit.  Genitourinary: Positive for flank pain. Negative for dysuria, hematuria, decreased urine volume,  difficulty urinating and testicular pain.  Musculoskeletal: Positive for myalgias and back pain.  Skin: Negative.   Neurological: Negative for weakness and headaches.    Allergies  Review of patient's allergies indicates no known allergies.  Home Medications   Current Outpatient Rx  Name Route Sig Dispense Refill  . ACETAMINOPHEN 500 MG PO TABS Oral Take 1,000 mg by mouth every 6 (six) hours as needed. Pain    . AMLODIPINE BESYLATE-VALSARTAN 10-160 MG PO TABS Oral Take 1 tablet by mouth daily.      . ASPIRIN EC 81 MG PO TBEC Oral Take 81 mg by mouth daily.     Marland Kitchen NAPROXEN 500 MG PO TABS Oral Take 1 tablet (500 mg total) by mouth 2 (two) times daily. 20 tablet 0  . FISH OIL PO Oral Take 1 capsule by mouth daily.      Marland Kitchen OMEPRAZOLE MAGNESIUM 20 MG PO TBEC Oral Take 20 mg by mouth daily.    . OXYCODONE-ACETAMINOPHEN 5-325 MG PO TABS Oral Take 1 tablet by mouth every 4 (four) hours as needed. For pain     . TRAMADOL HCL 50 MG PO TABS  Take one-two tabs po q 4-6 hrs prn pain 20 tablet 0    BP 138/93  Pulse 79  Temp 97.8 F (36.6 C)  Resp 16  Ht 5\' 6"  (1.676 m)  Wt 260 lb (117.935 kg)  BMI 41.97 kg/m2  SpO2 100%  Physical Exam  Nursing note and vitals reviewed. Constitutional: He is oriented to person,  place, and time. He appears well-developed and well-nourished. No distress.  HENT:  Head: Normocephalic and atraumatic.  Mouth/Throat: Oropharynx is clear and moist.  Neck: Normal range of motion. Neck supple.  Cardiovascular: Normal rate, regular rhythm and normal heart sounds.   Pulmonary/Chest: Effort normal and breath sounds normal. No respiratory distress. He exhibits no tenderness.  Abdominal: Soft. He exhibits no distension and no mass. There is no tenderness. There is no rebound and no guarding.  Musculoskeletal: Normal range of motion. He exhibits tenderness. He exhibits no edema.       Thoracic back: He exhibits tenderness. He exhibits normal range of motion, no bony  tenderness, no swelling, no edema and normal pulse.       Back:  Lymphadenopathy:    He has no cervical adenopathy.  Neurological: He is alert and oriented to person, place, and time. He has normal reflexes. He exhibits normal muscle tone. Coordination normal.  Skin: Skin is warm and dry.    ED Course  Procedures (including critical care time)  Results for orders placed during the hospital encounter of 05/16/11  URINALYSIS, ROUTINE W REFLEX MICROSCOPIC      Component Value Range   Color, Urine YELLOW  YELLOW    APPearance CLEAR  CLEAR    Specific Gravity, Urine 1.015  1.005 - 1.030    pH 6.0  5.0 - 8.0    Glucose, UA NEGATIVE  NEGATIVE (mg/dL)   Hgb urine dipstick NEGATIVE  NEGATIVE    Bilirubin Urine NEGATIVE  NEGATIVE    Ketones, ur NEGATIVE  NEGATIVE (mg/dL)   Protein, ur NEGATIVE  NEGATIVE (mg/dL)   Urobilinogen, UA 0.2  0.0 - 1.0 (mg/dL)   Nitrite NEGATIVE  NEGATIVE    Leukocytes, UA NEGATIVE  NEGATIVE       1. Left flank pain       MDM    Patient previous ED charts and imaging were reviewed.  Pt seen here in January for similar pain to the right flank.  No evidence of kidney stone.    Patient / Family / Caregiver understand and agree with initial ED impression and plan with expectations set for ED visit. Pt stable in ED with no significant deterioration in condition.     Lenox Ladouceur L. Giorgio Chabot, [place] 06/03/11 1454

## 2011-06-03 NOTE — ED Provider Notes (Signed)
Medical screening examination/treatment/procedure(s) were performed by non-physician practitioner and as supervising physician I was immediately available for consultation/collaboration. Devoria Albe, MD, Armando Gang   Ward Givens, MD 06/03/11 [phone]

## 2011-06-08 ENCOUNTER — Encounter: Payer: Self-pay | Admitting: Internal Medicine

## 2011-06-09 ENCOUNTER — Ambulatory Visit: Payer: 59 | Admitting: Gastroenterology

## 2011-06-16 ENCOUNTER — Encounter: Payer: Self-pay | Admitting: Gastroenterology

## 2011-06-16 ENCOUNTER — Ambulatory Visit (INDEPENDENT_AMBULATORY_CARE_PROVIDER_SITE_OTHER): Payer: 59 | Admitting: Gastroenterology

## 2011-06-16 VITALS — BP 128/77 | HR 85 | Temp 98.1°F | Ht 66.0 in | Wt 259.8 lb

## 2011-06-16 DIAGNOSIS — K625 Hemorrhage of anus and rectum: Secondary | ICD-10-CM

## 2011-06-16 DIAGNOSIS — R109 Unspecified abdominal pain: Secondary | ICD-10-CM

## 2011-06-16 DIAGNOSIS — K219 Gastro-esophageal reflux disease without esophagitis: Secondary | ICD-10-CM

## 2011-06-16 MED ORDER — HYDROCORTISONE ACETATE 25 MG RE SUPP: 25.0000 mg | Freq: Two times a day (BID) | RECTAL | Status: DC

## 2011-06-16 MED ORDER — OMEPRAZOLE 20 MG PO CPDR: 20.0000 mg | DELAYED_RELEASE_CAPSULE | Freq: Every day | ORAL | Status: DC

## 2011-06-16 NOTE — Progress Notes (Signed)
Referring Provider: Kirk Ruths, MD Primary Care Physician:  Kirk Ruths, MD, MD Primary Gastroenterologist: Dr. Jena Gauss   Chief Complaint  Patient presents with  . Rectal Bleeding    HPI:   Pt returns today at the request of Justin Mend, [place] with Dr. Regino Schultze due to intermittent rectal bleeding. TCS/EGD done April 2012, noted friable anal canal. Intermittent paper and stool hematochezia, usually once per month. Denies rectal pain/discomfort. No pruritis. Rare constipation. Taking Zantac once per day for about a week, states Prilosec expensive through insurance. Notes several month hx of left-sided rib pain, worse with movement, moving wrong way. When lays side, worsens. No N/V. Eating doesn't affect pain.   Has been seen at [facility]. Sees Urology for hematuria.  Job: works in Air traffic controller, Industrial/product designer.   Past Medical History  Diagnosis Date  . Hypertension   . Acid reflux   . Chronic back pain     Past Surgical History  Procedure Date  . Esophagogastroduodenoscopy 08/07/10    schatzkis ring otherwise normal, s/p 56-F dilation, small hiatal hernia  . Colonoscopy 08/07/10    friable anal canal otherwise normal    Current Outpatient Prescriptions  Medication Sig Dispense Refill  . amLODipine-valsartan (EXFORGE) 10-160 MG per tablet Take 1 tablet by mouth daily.        Marland Kitchen aspirin EC 81 MG tablet Take 81 mg by mouth daily.       . naproxen (NAPROSYN) 500 MG tablet Take 1 tablet (500 mg total) by mouth 2 (two) times daily.  20 tablet  0  . Omega-3 Fatty Acids (FISH OIL PO) Take 1 capsule by mouth daily.        Marland Kitchen oxyCODONE-acetaminophen (PERCOCET) 5-325 MG per tablet Take 1 tablet by mouth every 4 (four) hours as needed. For pain       . ranitidine (ZANTAC) 150 MG tablet Take 150 mg by mouth 2 (two) times daily.      . hydrocortisone (ANUSOL-HC) 25 MG suppository Place 1 suppository (25 mg total) rectally every 12 (twelve) hours. For 7 days  14 suppository   1  . omeprazole (PRILOSEC) 20 MG capsule Take 1 capsule (20 mg total) by mouth daily.  30 capsule  3    Allergies as of 06/16/2011  . (No Known Allergies)    Family History  Problem Relation Age of Onset  . Diabetes Mother   . Hypertension Mother     History   Social History  . Marital Status: Widowed    Spouse Name: [name]    Number of Children: [name]  . Years of Education: [name]   Social History Main Topics  . Smoking status: Former Smoker -- 0.5 packs/day for 10 years    Types: Cigarettes    Quit date: 04/22/1991  . Smokeless tobacco: Never Used  . Alcohol Use: 0.0 oz/week     Occ  . Drug Use: No  . Sexually Active: Yes   Other Topics Concern  . None   Social History Narrative  . None    Review of Systems: Gen: Denies fever, chills, anorexia. Denies fatigue, weakness, weight loss.  CV: Denies chest pain, palpitations, syncope, peripheral edema, and claudication. Resp: Denies dyspnea at rest, cough, wheezing, coughing up blood, and pleurisy. GI: Denies vomiting blood, jaundice, and fecal incontinence.   Denies dysphagia or odynophagia. Derm: Denies rash, itching, dry skin Psych: Denies depression, anxiety, memory loss, confusion. No homicidal or suicidal ideation.  Heme: Denies bruising, bleeding, and enlarged lymph nodes.  Physical Exam: BP 128/77  Pulse 85  Temp(Src) 98.1 F (36.7 C) (Temporal)  Ht 5\' 6"  (1.676 m)  Wt 259 lb 12.8 oz (117.845 kg)  BMI 41.93 kg/m2 General:   Alert and oriented. No distress noted. Pleasant and cooperative.  Head:  Normocephalic and atraumatic. Eyes:  Conjuctiva clear without scleral icterus. Mouth:  Oral mucosa pink and moist. Good dentition. No lesions. Neck:  Supple, without mass or thyromegaly. Heart:  S1, S2 present without murmurs, rubs, or gallops. Regular rate and rhythm. Abdomen:  +BS, soft, non-tender and non-distended. TTP left ribs, intracostal. No rebound or guarding. No HSM or masses noted. Msk:  Symmetrical  without gross deformities. Normal posture. Extremities:  Without edema. Neurologic:  Alert and  oriented x4;  grossly normal neurologically. Skin:  Intact without significant lesions or rashes. Psych:  Alert and cooperative. Normal mood and affect.

## 2011-06-16 NOTE — Patient Instructions (Signed)
Please have blood work completed. We will call you with the results.  It is best to continue on Prilosec daily, especially while you are taking an anti-inflammatory. This will help protect your stomach and also help with your history of reflux. Stop Zantac while you are taking Prilosec.   Please follow the high fiber diet provided. I have sent a prescription for suppositories to your pharmacy for 7 days total. This will help decrease any inflammation going on that could be causing the bleeding.     Rectal Bleeding Rectal bleeding is when blood passes out of the anus. It is usually a sign that something is wrong. It may not be serious, but it should always be evaluated. Rectal bleeding may present as bright red blood or extremely dark stools. The color may range from dark red or maroon to black (like tar). It is important that the cause of rectal bleeding be identified so treatment can be started and the problem corrected. CAUSES    Hemorrhoids. These are enlarged (dilated) blood vessels or veins in the anal or rectal area.   Fistulas. These are abnormal, burrowing channels that usually run from inside the rectum to the skin around the anus. They can bleed.   Anal fissures. This is a tear in the tissue of the anus. Bleeding occurs with bowel movements.   Diverticulosis. This is a condition in which pockets or sacs project from the bowel wall. Occasionally, the sacs can bleed.   Diverticulitis. This is an infection involving diverticulosis of the colon.   Proctitis and colitis. These are conditions in which the rectum, colon, or both, can become inflamed and pitted (ulcerated).   Polyps and cancer. Polyps are non-cancerous (benign) growths in the colon that may bleed. Certain types of polyps turn into cancer.   Protrusion of the rectum. Part of the rectum can project from the anus and bleed.   Certain medicines.   Intestinal infections.   Blood vessel abnormalities.  HOME CARE  INSTRUCTIONS  Eat a high-fiber diet to keep your stool soft.   Limit activity.   Drink enough fluids to keep your urine clear or pale yellow.   Warm baths may be useful to soothe rectal pain.   Follow up with your caregiver as directed.  SEEK IMMEDIATE MEDICAL CARE IF:  You develop increased bleeding.   You have black or dark red stools.   You vomit blood or material that looks like coffee grounds.   You have abdominal pain or tenderness.   You have a fever.   You feel weak, nauseous, or you faint.   You have severe rectal pain or you are unable to have a bowel movement.  MAKE SURE YOU:  Understand these instructions.   Will watch your condition.   Will get help right away if you are not doing well or get worse.  Document Released: 09/19/2001 Document Revised: 03/19/2011 Document Reviewed: 09/14/2010 [facility] Patient Information 2012 [place], [place].

## 2011-06-16 NOTE — Assessment & Plan Note (Signed)
EGD April 2012, see above. On Zantac currently with rare breakthrough symptoms. As he is taking Naprosyn currently, would like him to continue Prilosec for now. Samples provided, stop Zantac. Continue Prilosec. Discussed rationale with pt.

## 2011-06-16 NOTE — Assessment & Plan Note (Signed)
Actually located left-sided ribs, seeing PCP and orthopedics. Doubt GI etiology at this time. Obtain CBC and HFP for our files. Return in 2 mos.

## 2011-06-16 NOTE — Assessment & Plan Note (Signed)
51 year old male with intermittent low-volume hematochezia, usually once per month. No rectal discomfort. Refused rectal exam today. Known friable anal canal with last TCS less than 1 year ago. Likely this is cause of low-volume hematochezia. Needs high fiber diet, avoidance of constipation, and anusol suppositories X 7 days. Rx sent to pharmacy. Pt to return in 2 mos for evaluation. Informed to call office if any worsening of symptoms.   Obtain CBC Return in 2 mos Anusol suppositories High fiber diet

## 2011-06-17 NOTE — Progress Notes (Signed)
Faxed to PCP

## 2011-06-19 LAB — CBC WITH DIFFERENTIAL/PLATELET
Basophils Absolute: 0 10*3/uL (ref 0.0–0.1)
Basophils Relative: 0 % (ref 0–1)
Eosinophils Absolute: 0.3 10*3/uL (ref 0.0–0.7)
Eosinophils Relative: 5 % (ref 0–5)
HCT: 45.6 % (ref 39.0–52.0)
Hemoglobin: 14.6 g/dL (ref 13.0–17.0)
Lymphocytes Relative: 35 % (ref 12–46)
Lymphs Abs: 2.5 10*3/uL (ref 0.7–4.0)
MCH: 30 pg (ref 26.0–34.0)
MCHC: 32 g/dL (ref 30.0–36.0)
MCV: 93.6 fL (ref 78.0–100.0)
Monocytes Absolute: 0.6 10*3/uL (ref 0.1–1.0)
Monocytes Relative: 9 % (ref 3–12)
Neutro Abs: 3.6 10*3/uL (ref 1.7–7.7)
Neutrophils Relative %: 51 % (ref 43–77)
Platelets: 244 10*3/uL (ref 150–400)
RBC: 4.87 MIL/uL (ref 4.22–5.81)
RDW: 17.5 % — ABNORMAL HIGH (ref 11.5–15.5)
WBC: 7 10*3/uL (ref 4.0–10.5)

## 2011-06-19 LAB — HEPATIC FUNCTION PANEL
ALT: 23 U/L (ref 0–53)
AST: 26 U/L (ref 0–37)
Albumin: 4.3 g/dL (ref 3.5–5.2)
Alkaline Phosphatase: 56 U/L (ref 39–117)
Bilirubin, Direct: 0.1 mg/dL (ref 0.0–0.3)
Indirect Bilirubin: 0.6 mg/dL (ref 0.0–0.9)
Total Bilirubin: 0.7 mg/dL (ref 0.3–1.2)
Total Protein: 7 g/dL (ref 6.0–8.3)

## 2011-06-22 NOTE — Progress Notes (Signed)
Quick Note:  No anemia, LFTs look great. Noted left-sided rib pain at visit. No relation to eating/drinking. Worse with movement. Doubt GI etiology. Please make sure pt follows up with PCP regarding rib pain. Will have f/u with me in May, looks like already scheduled. ______

## 2011-06-23 NOTE — Progress Notes (Signed)
Quick Note:  Pt aware ______ 

## 2011-07-17 ENCOUNTER — Other Ambulatory Visit (HOSPITAL_COMMUNITY): Payer: Self-pay | Admitting: Physician Assistant

## 2011-07-17 DIAGNOSIS — K429 Umbilical hernia without obstruction or gangrene: Secondary | ICD-10-CM

## 2011-07-21 IMAGING — US US EXTREM LOW VENOUS*R*
1 series · 14 of 24 positions shown · non-contrast
Comparison: None

CLINICAL DATA: Right lower extremity pain

RIGHT LOWER EXTREMITY VENOUS DUPLEX ULTRASOUND
TECHNIQUE: Gray-scale sonography with graded compression, as well
as color Doppler and duplex ultrasound, were performed to evaluate
the deep venous system of the lower extremity from the level of the
common femoral vein through the popliteal and proximal calf veins.
Spectral Doppler was utilized to evaluate flow at rest and with
distal augmentation maneuvers.

[Series 1: us extrem low venous*right* · 14 of 58 slices shown]
[im 1/58]
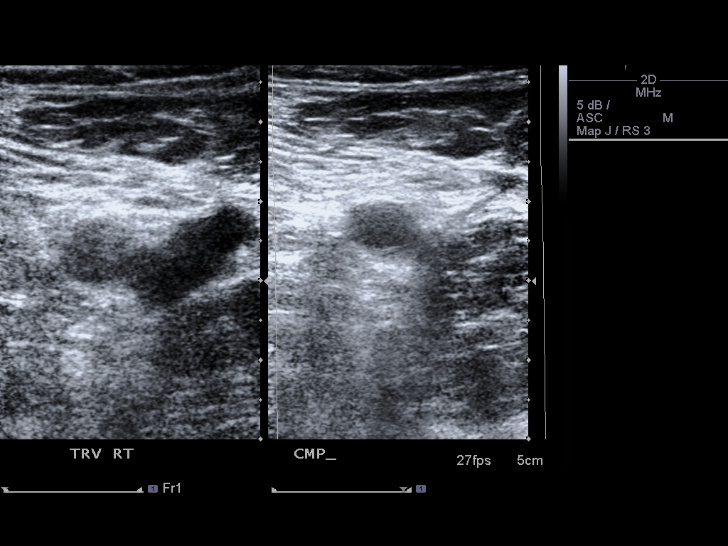
[im 5/58]
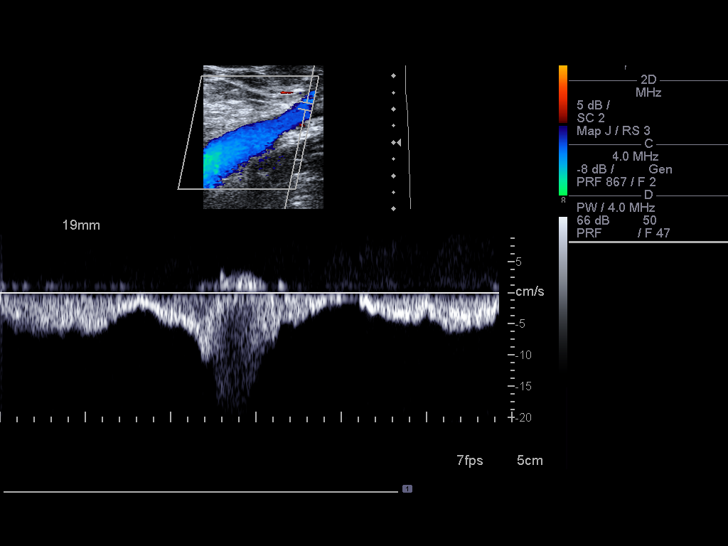
[im 10/58]
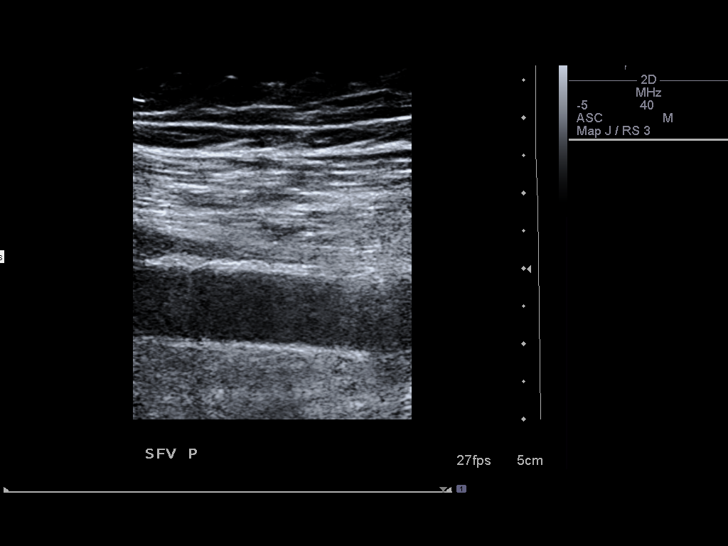
[im 15/58]
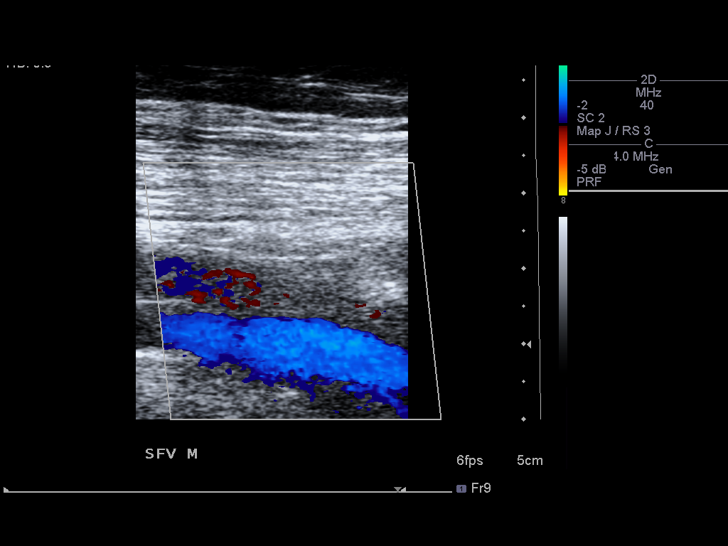
[im 18/58]
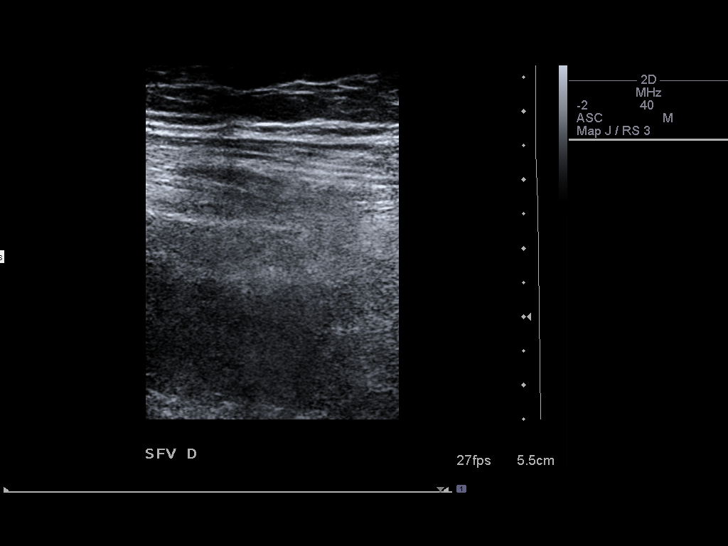
[im 23/58]
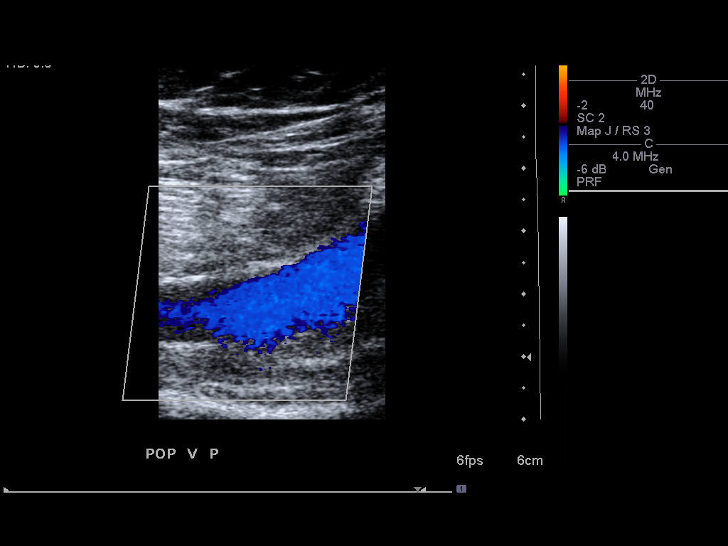
[im 28/58]
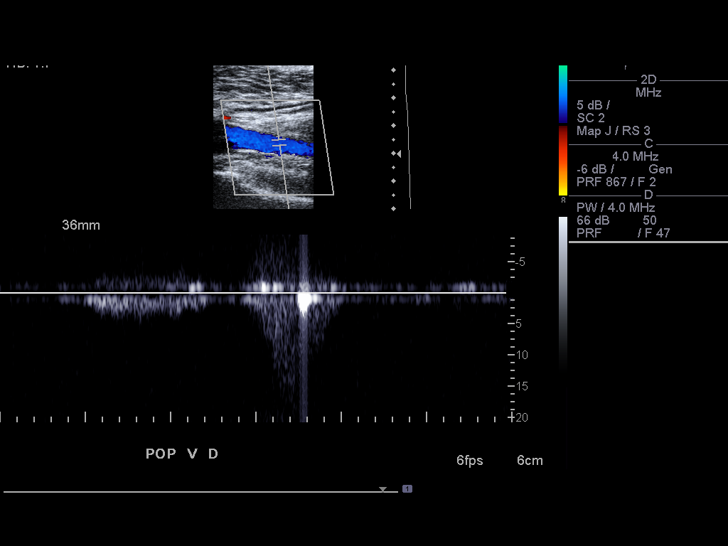
[im 30/58]
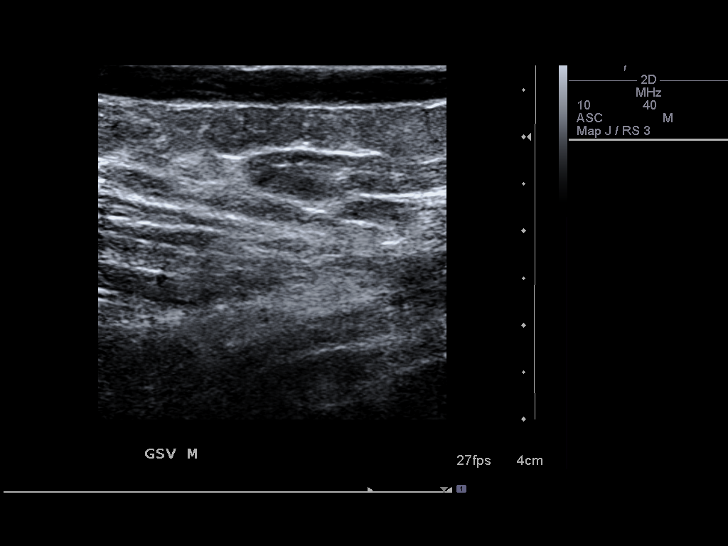
[im 35/58]
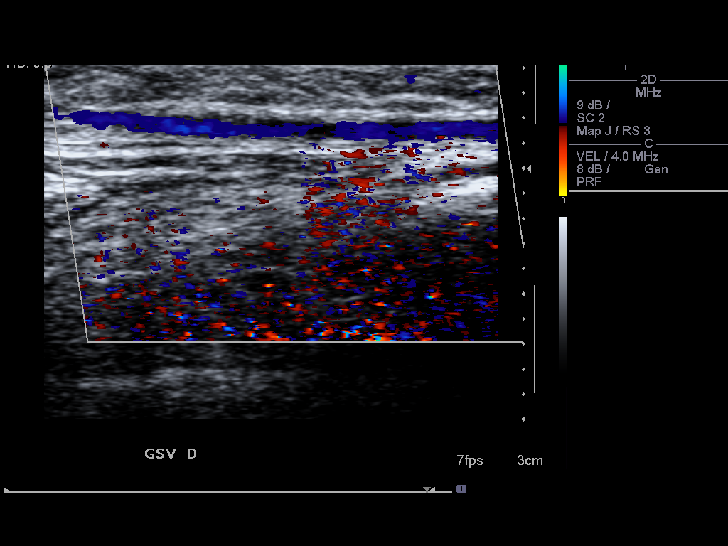
[im 40/58]
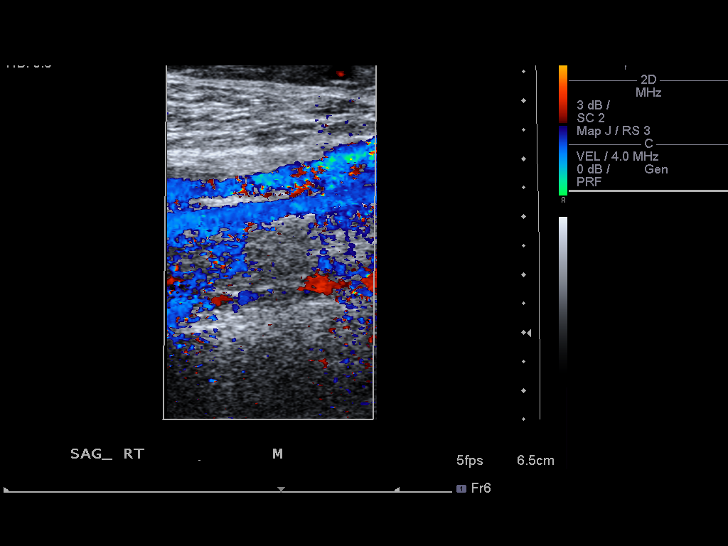
[im 45/58]
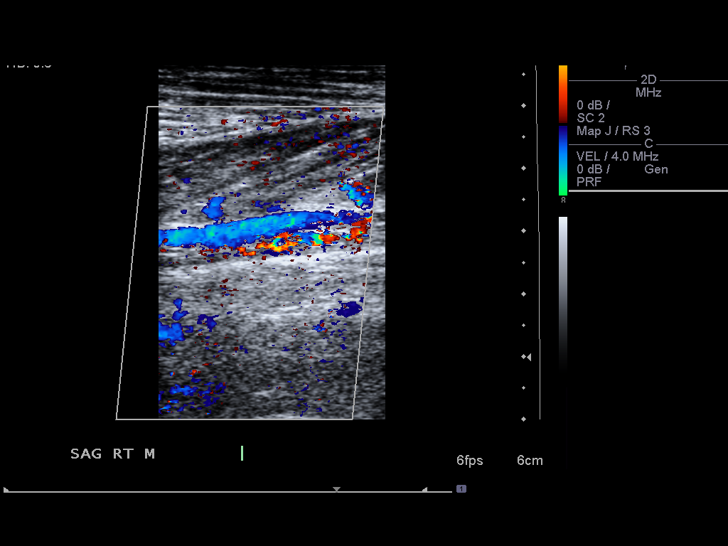
[im 48/58]
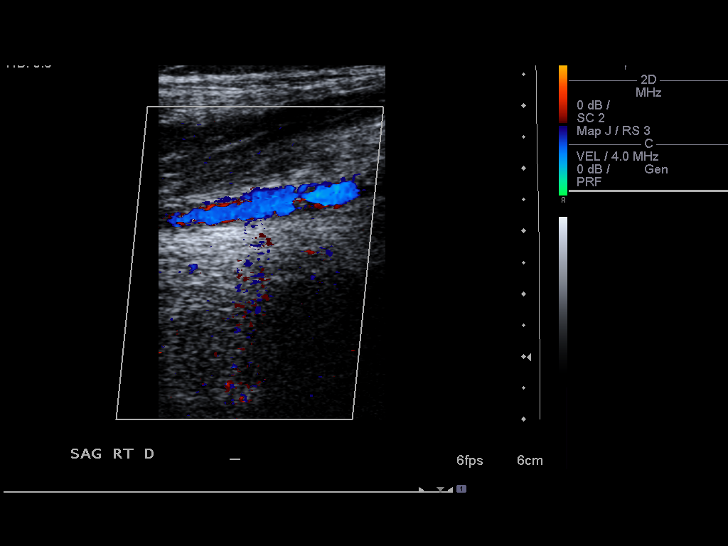
[im 53/58]
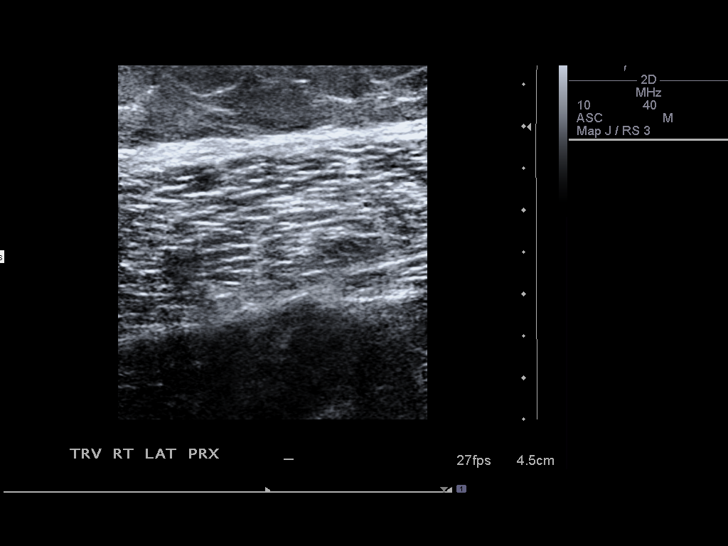
[im 58/58]
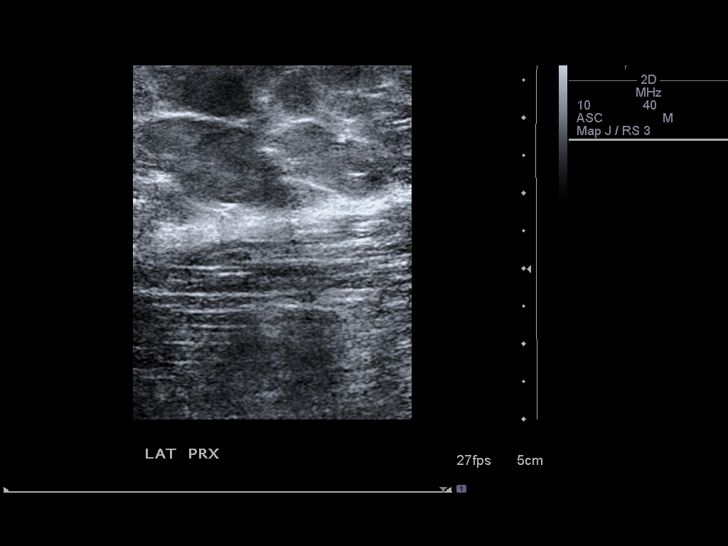

[14 of 24 positions shown; findings below may reference images not displayed]

FINDINGS: Deep venous system patent and compressible from right
groin through popliteal fossa.
Spontaneous venous flow present with intact augmentation and
evidence of respiratory phasicity.
No intraluminal thrombus identified.
Visualized portion of the greater saphenous system unremarkable.
IMPRESSION: No evidence of deep venous thrombosis in the right lower extremity.

## 2011-07-23 ENCOUNTER — Emergency Department (HOSPITAL_COMMUNITY): Payer: 59

## 2011-07-23 ENCOUNTER — Encounter (HOSPITAL_COMMUNITY): Payer: Self-pay | Admitting: Emergency Medicine

## 2011-07-23 ENCOUNTER — Emergency Department (HOSPITAL_COMMUNITY)
Admission: EM | Admit: 2011-07-23 | Discharge: 2011-07-23 | Disposition: A | Discharge status: Home / Self Care | Payer: 59 | Attending: Emergency Medicine | Admitting: Emergency Medicine

## 2011-07-23 DIAGNOSIS — I10 Essential (primary) hypertension: Secondary | ICD-10-CM | POA: Insufficient documentation

## 2011-07-23 DIAGNOSIS — S29011A Strain of muscle and tendon of front wall of thorax, initial encounter: Secondary | ICD-10-CM

## 2011-07-23 DIAGNOSIS — K219 Gastro-esophageal reflux disease without esophagitis: Secondary | ICD-10-CM | POA: Insufficient documentation

## 2011-07-23 DIAGNOSIS — R109 Unspecified abdominal pain: Secondary | ICD-10-CM | POA: Insufficient documentation

## 2011-07-23 DIAGNOSIS — IMO0002 Reserved for concepts with insufficient information to code with codable children: Secondary | ICD-10-CM

## 2011-07-23 DIAGNOSIS — X58XXXA Exposure to other specified factors, initial encounter: Secondary | ICD-10-CM | POA: Insufficient documentation

## 2011-07-23 MED ORDER — METHOCARBAMOL 500 MG PO TABS: ORAL_TABLET | ORAL | Status: DC

## 2011-07-23 MED ORDER — HYDROCODONE-ACETAMINOPHEN 7.5-325 MG PO TABS: 1.0000 | ORAL_TABLET | ORAL | Status: DC | PRN

## 2011-07-23 MED ORDER — DEXAMETHASONE 4 MG PO TABS: 4.0000 mg | ORAL_TABLET | Freq: Two times a day (BID) | ORAL | Status: DC

## 2011-07-23 NOTE — ED Provider Notes (Signed)
History     CSN: [id number]  Arrival date & time 07/23/11  4098   None     Chief Complaint  Patient presents with  . Flank Pain    side pain    (Consider location/radiation/quality/duration/timing/severity/associated sxs/prior treatment) Patient is a 51 y.o. male presenting with flank pain. The history is provided by the patient.  Flank Pain This is a recurrent problem. The current episode started in the past 7 days. The problem occurs daily. The problem has been gradually worsening. Pertinent negatives include no abdominal pain, anorexia, arthralgias, change in bowel habit, chest pain, chills, coughing, fever, myalgias, nausea, neck pain or urinary symptoms. The symptoms are aggravated by twisting and standing. He has tried NSAIDs for the symptoms. The treatment provided mild relief.    Past Medical History  Diagnosis Date  . Hypertension   . Acid reflux   . Chronic back pain     Past Surgical History  Procedure Date  . Esophagogastroduodenoscopy 08/07/10    schatzkis ring otherwise normal, s/p 56-F dilation, small hiatal hernia  . Colonoscopy 08/07/10    friable anal canal otherwise normal    Family History  Problem Relation Age of Onset  . Diabetes Mother   . Hypertension Mother     History  Substance Use Topics  . Smoking status: Former Smoker -- 0.5 packs/day for 10 years    Types: Cigarettes    Quit date: 04/22/1991  . Smokeless tobacco: Never Used  . Alcohol Use: 0.0 oz/week     Occ      Review of Systems  Constitutional: Negative for fever, chills and activity change.       All ROS Neg except as noted in HPI  HENT: Negative for nosebleeds and neck pain.   Eyes: Negative for photophobia and discharge.  Respiratory: Negative for cough, shortness of breath and wheezing.   Cardiovascular: Negative for chest pain and palpitations.  Gastrointestinal: Negative for nausea, abdominal pain, blood in stool, anorexia and change in bowel habit.  Genitourinary:  Positive for flank pain. Negative for dysuria, frequency and hematuria.  Musculoskeletal: Positive for back pain. Negative for myalgias and arthralgias.  Skin: Negative.   Neurological: Negative for dizziness, seizures and speech difficulty.  Psychiatric/Behavioral: Negative for hallucinations and confusion.    Allergies  Review of patient's allergies indicates no known allergies.  Home Medications   Current Outpatient Rx  Name Route Sig Dispense Refill  . AMLODIPINE BESYLATE-VALSARTAN 10-160 MG PO TABS Oral Take 1 tablet by mouth daily.      . ASPIRIN EC 81 MG PO TBEC Oral Take 81 mg by mouth daily.     Marland Kitchen HYDROCORTISONE ACETATE 25 MG RE SUPP Rectal Place 1 suppository (25 mg total) rectally every 12 (twelve) hours. For 7 days 14 suppository 1  . NAPROXEN 500 MG PO TABS Oral Take 1 tablet (500 mg total) by mouth 2 (two) times daily. 20 tablet 0  . FISH OIL PO Oral Take 1 capsule by mouth daily.      Marland Kitchen OMEPRAZOLE 20 MG PO CPDR Oral Take 1 capsule (20 mg total) by mouth daily. 30 capsule 3  . OXYCODONE-ACETAMINOPHEN 5-325 MG PO TABS Oral Take 1 tablet by mouth every 4 (four) hours as needed. For pain     . RANITIDINE HCL 150 MG PO TABS Oral Take 150 mg by mouth 2 (two) times daily.      BP 149/91  Pulse 94  Temp(Src) 98.7 F (37.1 C) (Oral)  Resp 18  Ht 5\' 6"  (1.676 m)  Wt 256 lb (116.121 kg)  BMI 41.32 kg/m2  SpO2 96%  Physical Exam  Nursing note and vitals reviewed. Constitutional: He is oriented to person, place, and time. He appears well-developed and well-nourished.  Non-toxic appearance.  HENT:  Head: Normocephalic.  Right Ear: Tympanic membrane and external ear normal.  Left Ear: Tympanic membrane and external ear normal.  Eyes: EOM and lids are normal. Pupils are equal, round, and reactive to light.  Neck: Normal range of motion. Neck supple. Carotid bruit is not present.  Cardiovascular: Normal rate, regular rhythm, normal heart sounds, intact distal pulses and  normal pulses.   Pulmonary/Chest: Breath sounds normal. No respiratory distress.  Abdominal: Soft. Bowel sounds are normal. There is no tenderness. There is no guarding.       There is mild to moderate soreness in the right flank area to palpation. Also with attempted range of motion of the same area. There is no palpable deformity.  Musculoskeletal: Normal range of motion.       Mild to moderate tenderness to palpation of the lumbar spine. There is no palpable step-off. Pain with attempted range of motion of the lumbar spine area.  Lymphadenopathy:       Head (right side): No submandibular adenopathy present.       Head (left side): No submandibular adenopathy present.    He has no cervical adenopathy.  Neurological: He is alert and oriented to person, place, and time. He has normal strength. No cranial nerve deficit or sensory deficit. He exhibits normal muscle tone. Coordination normal.  Skin: Skin is warm and dry.  Psychiatric: He has a normal mood and affect. His speech is normal.    ED Course  Procedures (including critical care time)  Labs Reviewed - No data to display No results found.   No diagnosis found.    MDM  I have reviewed nursing notes, vital signs, and all appropriate lab and imaging results for this patient. I have reviewed the CT scan from January of this year. Review of the patient's history reveals that he has a deformity in which his left leg is slightly shorter than the right. He has been told that he has some degenerative disc disease of the lower back in the past.       Kathie Dike, [place] 08/01/11 1645

## 2011-07-23 NOTE — Discharge Instructions (Signed)
Your x-rays revealed degenerative disc changes of the lumbar spine there are no other deformities appreciated at this time. Your examination is consistent with muscle strain in the flank area. Please rest your back and flank area as much as possible. Please use Decadron 2 times daily with a meal until all taken. Please use Robaxin at bedtime for spasm. Use Tylenol or ibuprofen for mild pain, use Norco every 4 hours as needed for more severe pain. Please see the orthopedist of your choice if not improving.

## 2011-07-23 NOTE — ED Notes (Signed)
Pt states he does a lot of twisting and moving for work. Pt states saw PCP for same a few weeks ago. Was given pain medication. Got better and now pain has returned. Was told it was a strained muscle.

## 2011-07-30 ENCOUNTER — Encounter (HOSPITAL_COMMUNITY): Payer: Self-pay | Admitting: Pharmacy Technician

## 2011-07-30 NOTE — H&P (Signed)
  NTS SOAP Note  Vital Signs:  Vitals as of: 07/30/2011: Systolic 160: Diastolic 95: Heart Rate 77: Temp 96.49F: Height 62ft 6in: Weight 259Lbs 0 Ounces: OFC 0in: Respiratory Rate 0: O2 Saturation 0: Pain Level 0: BMI 42  BMI : 41.8 kg/m2  Subjective: This 51 Years 36 Months old Male presents for of  HERNIA: ,Has had an umbilical hernia which has been present for some, but is increasingly causing him discomfort.  Made worse with straining.  Review of Symptoms:  Constitutional:fatigue, tremors Head:unremarkable Eyes:unremarkable Nose/Mouth/Throat:unremarkable Cardiovascular:unremarkable Respiratory:dyspnea Gastrointestinabdominal pain,nausea,vomiting,heartburn Genitourinary:frequency joint and back pain Skin:unremarkable Hematolgic/Lymphatic:unremarkable Allergic/Immunologic:unremarkable   Past Medical History:Reviewed   Past Medical History  Surgical History: none Medical Problems: unremarkable Allergies: nkda Medications: exforge, asa, fish oil, oxycodone, prilosec   Social History:Reviewed  Social History  Preferred Language: English ([place]) Race:  Black or African American Ethnicity: Not Hispanic / Latino Age: 51 Years 7 Months Marital Status:  M Alcohol: 2 daily Recreational drug(s):  No   Smoking Status: Never smoker reviewed on 07/30/2011  Family History:Reviewed   Family History  Is there a family history [id number]    Objective Information: General:Well appearing, well nourished in no distress. Neck:Supple without lymphadenopathy.  Heart:RRR, no murmur or gallop.  Normal S1, S2.  No S3, S4.  Lungs:CTA bilaterally, no wheezes, rhonchi, rales.  Breathing unlabored. Abdomen:Soft, NT/ND, normal bowel sounds, no HSM, no masses.  No peritoneal signs.Reducible umbilical hernia present.  Assessment:Umbilical hernia  Diagnosis &amp; Procedure: DiagnosisCode: 553.1,  ProcedureCode: 84132,    Plan:Scheduled for umbilical herniorrhaphy with mesh on 08/05/11.   Patient Education:Alternative treatments to surgery were discussed with patient (and family).Risks and benefits  of procedure were fully explained to the patient (and family) who gave informed consent. Patient/family questions were addressed.  Follow-up:Pending Surgery

## 2011-07-30 NOTE — Patient Instructions (Addendum)
Your procedure is scheduled on:  08-05-2011  Report to [facility] at  9:15    AM.  Call this number if you have problems the morning of surgery: [phone]   Remember:   Do not drink or eat food:After Midnight.    Clear liquids include soda, tea, black coffee, apple or grape juice, broth.  Take these medicines the morning of surgery with A SIP OF WATER: amlodipine, prilosec   Do not wear jewelry, make-up or nail polish.  Do not wear lotions, powders, or perfumes. You may wear deodorant.  Do not shave 48 hours prior to surgery.  Do not bring valuables to the hospital.  Contacts, dentures or bridgework may not be worn into surgery.  Leave suitcase in the car. After surgery it may be brought to your room.  For patients admitted to the hospital, checkout time is 11:00 AM the day of discharge.   Patients discharged the day of surgery will not be allowed to drive home.  Name and phone number of your driver:   Special Instructions: CHG Shower Shower 2 days before surgery and 1 day before surgery with Hibiclens.   Please read over the following fact sheets that you were given: Pain Booklet, Surgical Site Infection Prevention, Anesthesia Post-op Instructions and Care and Recovery After Surgery

## 2011-07-31 ENCOUNTER — Encounter (HOSPITAL_COMMUNITY)
Admission: RE | Admit: 2011-07-31 | Discharge: 2011-07-31 | Disposition: A | Discharge status: Home / Self Care | Payer: 59 | Source: Ambulatory Visit | Attending: General Surgery | Admitting: General Surgery

## 2011-07-31 ENCOUNTER — Encounter (HOSPITAL_COMMUNITY): Payer: Self-pay

## 2011-07-31 LAB — CBC
HCT: 40.7 % (ref 39.0–52.0)
Hemoglobin: 13.5 g/dL (ref 13.0–17.0)
MCH: 31 pg (ref 26.0–34.0)
MCHC: 33.2 g/dL (ref 30.0–36.0)
MCV: 93.6 fL (ref 78.0–100.0)
Platelets: 265 10*3/uL (ref 150–400)
RBC: 4.35 MIL/uL (ref 4.22–5.81)
RDW: 15.2 % (ref 11.5–15.5)
WBC: 7.5 10*3/uL (ref 4.0–10.5)

## 2011-07-31 LAB — BASIC METABOLIC PANEL
BUN: 15 mg/dL (ref 6–23)
CO2: 27 mEq/L (ref 19–32)
Calcium: 10 mg/dL (ref 8.4–10.5)
Chloride: 103 mEq/L (ref 96–112)
Creatinine, Ser: 1.23 mg/dL (ref 0.50–1.35)
GFR calc Af Amer: 78 mL/min — ABNORMAL LOW (ref >90)
GFR calc non Af Amer: 67 mL/min — ABNORMAL LOW (ref >90)
Glucose, Bld: 103 mg/dL — ABNORMAL HIGH (ref 70–99)
Potassium: 4.3 mEq/L (ref 3.5–5.1)
Sodium: 140 mEq/L (ref 135–145)

## 2011-07-31 LAB — DIFFERENTIAL
Basophils Absolute: 0 10*3/uL (ref 0.0–0.1)
Basophils Relative: 0 % (ref 0–1)
Eosinophils Absolute: 0.2 10*3/uL (ref 0.0–0.7)
Eosinophils Relative: 3 % (ref 0–5)
Lymphocytes Relative: 32 % (ref 12–46)
Lymphs Abs: 2.4 10*3/uL (ref 0.7–4.0)
Monocytes Absolute: 0.7 10*3/uL (ref 0.1–1.0)
Monocytes Relative: 9 % (ref 3–12)
Neutro Abs: 4.1 10*3/uL (ref 1.7–7.7)
Neutrophils Relative %: 55 % (ref 43–77)

## 2011-07-31 LAB — SURGICAL PCR SCREEN
MRSA, PCR: NEGATIVE
Staphylococcus aureus: NEGATIVE

## 2011-07-31 NOTE — Progress Notes (Signed)
07/31/11 1429  OBSTRUCTIVE SLEEP APNEA  Score 4 or greater  Updated health history

## 2011-08-05 ENCOUNTER — Encounter (HOSPITAL_COMMUNITY): Payer: Self-pay | Admitting: Anesthesiology

## 2011-08-05 ENCOUNTER — Encounter (HOSPITAL_COMMUNITY): Payer: Self-pay | Admitting: *Deleted

## 2011-08-05 ENCOUNTER — Ambulatory Visit (HOSPITAL_COMMUNITY)
Admission: RE | Admit: 2011-08-05 | Discharge: 2011-08-05 | Disposition: A | Discharge status: Home / Self Care | Payer: 59 | Source: Ambulatory Visit | Attending: General Surgery | Admitting: General Surgery

## 2011-08-05 ENCOUNTER — Encounter (HOSPITAL_COMMUNITY)
Admission: RE | Disposition: A | Discharge status: Home / Self Care | Payer: Self-pay | Source: Ambulatory Visit | Attending: General Surgery

## 2011-08-05 ENCOUNTER — Ambulatory Visit (HOSPITAL_COMMUNITY): Payer: 59 | Admitting: Anesthesiology

## 2011-08-05 DIAGNOSIS — I10 Essential (primary) hypertension: Secondary | ICD-10-CM | POA: Insufficient documentation

## 2011-08-05 DIAGNOSIS — K429 Umbilical hernia without obstruction or gangrene: Secondary | ICD-10-CM | POA: Insufficient documentation

## 2011-08-05 DIAGNOSIS — Z01812 Encounter for preprocedural laboratory examination: Secondary | ICD-10-CM | POA: Insufficient documentation

## 2011-08-05 DIAGNOSIS — Z79899 Other long term (current) drug therapy: Secondary | ICD-10-CM | POA: Insufficient documentation

## 2011-08-05 DIAGNOSIS — G4733 Obstructive sleep apnea (adult) (pediatric): Secondary | ICD-10-CM | POA: Insufficient documentation

## 2011-08-05 HISTORY — PX: UMBILICAL HERNIA REPAIR: SHX196

## 2011-08-05 SURGERY — REPAIR, HERNIA, UMBILICAL, ADULT
Anesthesia: General | Site: Abdomen | Wound class: Clean

## 2011-08-05 MED ORDER — LACTATED RINGERS IV SOLN
INTRAVENOUS | Status: DC
  Administered 2011-08-05: 11:00:00 via INTRAVENOUS
  Administered 2011-08-05: 1000 mL via INTRAVENOUS

## 2011-08-05 MED ORDER — ONDANSETRON HCL 4 MG/2ML IJ SOLN
INTRAMUSCULAR | Status: AC
  Administered 2011-08-05: 4 mg via INTRAVENOUS
  Filled 2011-08-05: qty 2

## 2011-08-05 MED ORDER — BUPIVACAINE HCL (PF) 0.5 % IJ SOLN
INTRAMUSCULAR | Status: DC | PRN
  Administered 2011-08-05: 6 mL

## 2011-08-05 MED ORDER — MIDAZOLAM HCL 2 MG/2ML IJ SOLN
1.0000 mg | INTRAMUSCULAR | Status: DC | PRN
  Administered 2011-08-05: 2 mg via INTRAVENOUS

## 2011-08-05 MED ORDER — FENTANYL CITRATE 0.05 MG/ML IJ SOLN
INTRAMUSCULAR | Status: DC | PRN
  Administered 2011-08-05 (×6): 50 ug via INTRAVENOUS

## 2011-08-05 MED ORDER — KETOROLAC TROMETHAMINE 30 MG/ML IJ SOLN
30.0000 mg | Freq: Once | INTRAMUSCULAR | Status: AC
  Administered 2011-08-05: 30 mg via INTRAVENOUS

## 2011-08-05 MED ORDER — FENTANYL CITRATE 0.05 MG/ML IJ SOLN
INTRAMUSCULAR | Status: AC
  Administered 2011-08-05: 50 ug via INTRAVENOUS
  Filled 2011-08-05: qty 2

## 2011-08-05 MED ORDER — GLYCOPYRROLATE 0.2 MG/ML IJ SOLN
INTRAMUSCULAR | Status: DC | PRN
  Administered 2011-08-05: .3 mg via INTRAVENOUS
  Administered 2011-08-05: 0.2 mg via INTRAVENOUS

## 2011-08-05 MED ORDER — FENTANYL CITRATE 0.05 MG/ML IJ SOLN
INTRAMUSCULAR | Status: AC
  Administered 2011-08-05: 50 ug via INTRAVENOUS
  Filled 2011-08-05: qty 5

## 2011-08-05 MED ORDER — BUPIVACAINE HCL (PF) 0.5 % IJ SOLN
INTRAMUSCULAR | Status: AC
  Filled 2011-08-05: qty 30

## 2011-08-05 MED ORDER — LIDOCAINE HCL (PF) 1 % IJ SOLN
INTRAMUSCULAR | Status: AC
  Filled 2011-08-05: qty 5

## 2011-08-05 MED ORDER — PROPOFOL 10 MG/ML IV EMUL
INTRAVENOUS | Status: AC
  Filled 2011-08-05: qty 20

## 2011-08-05 MED ORDER — PROPOFOL 10 MG/ML IV BOLUS
INTRAVENOUS | Status: DC | PRN
  Administered 2011-08-05: 100 mg via INTRAVENOUS
  Administered 2011-08-05: 200 mg via INTRAVENOUS

## 2011-08-05 MED ORDER — POVIDONE-IODINE 10 % EX OINT
TOPICAL_OINTMENT | CUTANEOUS | Status: DC | PRN
  Administered 2011-08-05: 1 via TOPICAL

## 2011-08-05 MED ORDER — CEFAZOLIN SODIUM-DEXTROSE 2-3 GM-% IV SOLR: 2.0000 g | INTRAVENOUS | Status: DC

## 2011-08-05 MED ORDER — ENOXAPARIN SODIUM 40 MG/0.4ML ~~LOC~~ SOLN
40.0000 mg | Freq: Once | SUBCUTANEOUS | Status: AC
  Administered 2011-08-05: 40 mg via SUBCUTANEOUS

## 2011-08-05 MED ORDER — ONDANSETRON HCL 4 MG/2ML IJ SOLN: 4.0000 mg | Freq: Once | INTRAMUSCULAR | Status: DC | PRN

## 2011-08-05 MED ORDER — NEOSTIGMINE METHYLSULFATE 1 MG/ML IJ SOLN
INTRAMUSCULAR | Status: DC | PRN
  Administered 2011-08-05: 2 mg via INTRAVENOUS

## 2011-08-05 MED ORDER — 0.9 % SODIUM CHLORIDE (POUR BTL) OPTIME
TOPICAL | Status: DC | PRN
  Administered 2011-08-05: 1000 mL

## 2011-08-05 MED ORDER — OXYCODONE-ACETAMINOPHEN 7.5-325 MG PO TABS: 1.0000 | ORAL_TABLET | ORAL | Status: DC | PRN

## 2011-08-05 MED ORDER — DEXAMETHASONE SODIUM PHOSPHATE 4 MG/ML IJ SOLN
INTRAMUSCULAR | Status: AC
  Filled 2011-08-05: qty 1

## 2011-08-05 MED ORDER — ROCURONIUM BROMIDE 50 MG/5ML IV SOLN
INTRAVENOUS | Status: DC
Start: 2011-08-05 — End: 2011-08-05
  Filled 2011-08-05: qty 1

## 2011-08-05 MED ORDER — MIDAZOLAM HCL 2 MG/2ML IJ SOLN
INTRAMUSCULAR | Status: AC
  Administered 2011-08-05: 2 mg via INTRAVENOUS
  Filled 2011-08-05: qty 2

## 2011-08-05 MED ORDER — DEXAMETHASONE SODIUM PHOSPHATE 4 MG/ML IJ SOLN
4.0000 mg | Freq: Once | INTRAMUSCULAR | Status: AC
  Administered 2011-08-05: 4 mg via INTRAVENOUS

## 2011-08-05 MED ORDER — POVIDONE-IODINE 10 % EX OINT
TOPICAL_OINTMENT | CUTANEOUS | Status: AC
  Filled 2011-08-05: qty 2

## 2011-08-05 MED ORDER — KETOROLAC TROMETHAMINE 30 MG/ML IJ SOLN
INTRAMUSCULAR | Status: AC
  Filled 2011-08-05: qty 1

## 2011-08-05 MED ORDER — ONDANSETRON HCL 4 MG/2ML IJ SOLN
INTRAMUSCULAR | Status: AC
  Filled 2011-08-05: qty 2

## 2011-08-05 MED ORDER — ONDANSETRON HCL 4 MG/2ML IJ SOLN
4.0000 mg | Freq: Once | INTRAMUSCULAR | Status: AC
  Administered 2011-08-05: 4 mg via INTRAVENOUS

## 2011-08-05 MED ORDER — LIDOCAINE HCL 1 % IJ SOLN
INTRAMUSCULAR | Status: DC | PRN
  Administered 2011-08-05: 50 mg via INTRADERMAL

## 2011-08-05 MED ORDER — ROCURONIUM BROMIDE 100 MG/10ML IV SOLN
INTRAVENOUS | Status: DC | PRN
  Administered 2011-08-05: 35 mg via INTRAVENOUS

## 2011-08-05 MED ORDER — FENTANYL CITRATE 0.05 MG/ML IJ SOLN
25.0000 ug | INTRAMUSCULAR | Status: DC | PRN
  Administered 2011-08-05 (×4): 50 ug via INTRAVENOUS

## 2011-08-05 MED ORDER — CEFAZOLIN SODIUM 1-5 GM-% IV SOLN
INTRAVENOUS | Status: DC | PRN
  Administered 2011-08-05: 2 g via INTRAVENOUS

## 2011-08-05 MED ORDER — CEFAZOLIN SODIUM-DEXTROSE 2-3 GM-% IV SOLR
INTRAVENOUS | Status: AC
  Filled 2011-08-05: qty 50

## 2011-08-05 MED ORDER — ENOXAPARIN SODIUM 40 MG/0.4ML ~~LOC~~ SOLN
SUBCUTANEOUS | Status: AC
  Administered 2011-08-05: 40 mg via SUBCUTANEOUS
  Filled 2011-08-05: qty 0.4

## 2011-08-05 SURGICAL SUPPLY — 39 items
ADH SKN CLS APL DERMABOND .7 (GAUZE/BANDAGES/DRESSINGS)
BAG HAMPER (MISCELLANEOUS) ×2 IMPLANT
BLADE SURG SZ11 CARB STEEL (BLADE) ×2 IMPLANT
CLOTH BEACON ORANGE TIMEOUT ST (SAFETY) ×2 IMPLANT
COVER SURGICAL LIGHT HANDLE (MISCELLANEOUS) ×4 IMPLANT
DECANTER SPIKE VIAL GLASS SM (MISCELLANEOUS) ×2 IMPLANT
DERMABOND ADVANCED (GAUZE/BANDAGES/DRESSINGS)
DERMABOND ADVANCED .7 DNX12 (GAUZE/BANDAGES/DRESSINGS) IMPLANT
DURAPREP 26ML APPLICATOR (WOUND CARE) ×2 IMPLANT
ELECT REM PT RETURN 9FT ADLT (ELECTROSURGICAL) ×2
ELECTRODE REM PT RTRN 9FT ADLT (ELECTROSURGICAL) ×1 IMPLANT
FORMALIN 10 PREFIL 120ML (MISCELLANEOUS) ×2 IMPLANT
GLOVE BIO SURGEON STRL SZ7.5 (GLOVE) ×2 IMPLANT
GLOVE ECLIPSE 6.5 STRL STRAW (GLOVE) ×1 IMPLANT
GLOVE ECLIPSE 7.0 STRL STRAW (GLOVE) ×1 IMPLANT
GLOVE INDICATOR 7.0 STRL GRN (GLOVE) ×2 IMPLANT
GOWN STRL REIN XL XLG (GOWN DISPOSABLE) ×6 IMPLANT
INST SET MINOR GENERAL (KITS) ×2 IMPLANT
KIT ROOM TURNOVER APOR (KITS) ×2 IMPLANT
MANIFOLD NEPTUNE II (INSTRUMENTS) ×2 IMPLANT
NDL HYPO 25X1 1.5 SAFETY (NEEDLE) ×1 IMPLANT
NEEDLE HYPO 25X1 1.5 SAFETY (NEEDLE) ×2 IMPLANT
NS IRRIG 1000ML POUR BTL (IV SOLUTION) ×2 IMPLANT
PACK MINOR (CUSTOM PROCEDURE TRAY) ×2 IMPLANT
PAD ARMBOARD 7.5X6 YLW CONV (MISCELLANEOUS) ×2 IMPLANT
PATCH VENTRAL SMALL 4.3 (Mesh Specialty) ×2 IMPLANT
SET BASIN LINEN APH (SET/KITS/TRAYS/PACK) ×2 IMPLANT
SPONGE GAUZE 2X2 8PLY STRL LF (GAUZE/BANDAGES/DRESSINGS) ×5 IMPLANT
STAPLER VISISTAT (STAPLE) ×1 IMPLANT
SUT ETHIBOND NAB MO 7 #0 18IN (SUTURE) ×2 IMPLANT
SUT VIC AB 2-0 CT2 27 (SUTURE) ×2 IMPLANT
SUT VIC AB 3-0 SH 27 (SUTURE) ×2
SUT VIC AB 3-0 SH 27X BRD (SUTURE) ×1 IMPLANT
SUT VIC AB 4-0 PS2 27 (SUTURE) IMPLANT
SUT VIC AB 5-0 P-3 18X BRD (SUTURE) IMPLANT
SUT VIC AB 5-0 P3 18 (SUTURE)
SUT VICRYL AB 3 0 TIES (SUTURE) IMPLANT
SYR CONTROL 10ML LL (SYRINGE) ×2 IMPLANT
TAPE CLOTH SURG 4X10 WHT LF (GAUZE/BANDAGES/DRESSINGS) ×1 IMPLANT

## 2011-08-05 NOTE — Anesthesia Preprocedure Evaluation (Signed)
Anesthesia Evaluation  Patient identified by MRN, date of birth, ID band Patient awake    Reviewed: Allergy & Precautions, H&P , NPO status , Patient's Chart, lab work & pertinent test results  History of Anesthesia Complications Negative for: history of anesthetic complications  Airway Mallampati: I      Dental  (+) Teeth Intact and Partial Upper   Pulmonary sleep apnea , former smoker breath sounds clear to auscultation        Cardiovascular hypertension, Pt. on medications Rhythm:Regular Rate:Normal     Neuro/Psych    GI/Hepatic GERD-  Medicated and Controlled,  Endo/Other    Renal/GU      Musculoskeletal   Abdominal   Peds  Hematology   Anesthesia Other Findings   Reproductive/Obstetrics                           Anesthesia Physical Anesthesia Plan  ASA: II  Anesthesia Plan: General   Post-op Pain Management:    Induction: Intravenous, Rapid sequence and Cricoid pressure planned  Airway Management Planned: Oral ETT  Additional Equipment:   Intra-op Plan:   Post-operative Plan: Extubation in OR  Informed Consent: I have reviewed the patients History and Physical, chart, labs and discussed the procedure including the risks, benefits and alternatives for the proposed anesthesia with the patient or authorized representative who has indicated his/her understanding and acceptance.     Plan Discussed with:   Anesthesia Plan Comments:         Anesthesia Quick Evaluation

## 2011-08-05 NOTE — Transfer of Care (Signed)
Immediate Anesthesia Transfer of Care Note  Patient: Calvin Henry  Procedure(s) Performed: Procedure(s) (LRB): HERNIA REPAIR UMBILICAL ADULT ([name])  Patient Location: PACU  Anesthesia Type: General  Level of Consciousness: sedated  Airway & Oxygen Therapy: Patient Spontanous Breathing and Patient connected to face mask oxygen  Post-op Assessment: Report given to PACU RN  Post vital signs: Reviewed and stable  Complications: No apparent anesthesia complications

## 2011-08-05 NOTE — Anesthesia Postprocedure Evaluation (Signed)
  Anesthesia Post-op Note  Patient: Calvin Henry  Procedure(s) Performed: Procedure(s) (LRB): HERNIA REPAIR UMBILICAL ADULT ([name])  Patient Location: PACU  Anesthesia Type: General  Level of Consciousness: awake  Airway and Oxygen Therapy: Patient Spontanous Breathing and Patient connected to face mask oxygen  Post-op Pain: none  Post-op Assessment: Post-op Vital signs reviewed, Patient's Cardiovascular Status Stable, Respiratory Function Stable, Patent Airway and No signs of Nausea or vomiting  Post-op Vital Signs: Reviewed and stable  Complications: No apparent anesthesia complications

## 2011-08-05 NOTE — Anesthesia Procedure Notes (Signed)
Procedure Name: Intubation Date/Time: 08/05/2011 10:36 AM Performed by: Glynn Octave E Pre-anesthesia Checklist: Patient identified, Patient being monitored, Timeout performed, Emergency Drugs available and Suction available Patient Re-evaluated:Patient Re-evaluated prior to inductionOxygen Delivery Method: Circle System Utilized Preoxygenation: Pre-oxygenation with 100% oxygen Intubation Type: IV induction, Rapid sequence and Cricoid Pressure applied Ventilation: Mask ventilation without difficulty Laryngoscope Size: Mac and 3 Grade View: Grade II Tube type: Oral Tube size: 7.0 mm Number of attempts: 1 Airway Equipment and Method: stylet Placement Confirmation: ETT inserted through vocal cords under direct vision,  positive ETCO2 and breath sounds checked- equal and bilateral Secured at: 21 cm Tube secured with: Tape Dental Injury: Teeth and Oropharynx as per pre-operative assessment

## 2011-08-05 NOTE — Op Note (Signed)
Patient:  Calvin Henry  DOB:  1961/03/28  MRN:  [id number]   Preop Diagnosis:  Umbilical hernia  Postop Diagnosis:  Same  Procedure:  Umbilical herniorrhaphy with mesh  Surgeon:  Franky Macho, M.D.  Anes:  General endotracheal  Indications:  Patient is a 51 year old black male presents with an umbilical hernia. The risks and benefits of the procedure including bleeding, infection, and recurrence the hernia were fully explained to the patient, gave informed consent.  Procedure note:  Patient is placed the supine position. After induction of general endotracheal anesthesia, the abdomen was prepped and draped using usual sterile technique with DuraPrep. Surgical site confirmation was performed.  An infraumbilical incision was made down to the fascia. The umbilicus was freed away from the underlying fascia. The hernia sac was then excised. Some properitoneal fat was in the hernia sac and this was reduced without difficulty. A medium-sized proceed ventral patch was then inserted and secured to the fascia using 0 Ethibond interrupted sutures. The fascia was then closed over this repair using 0 Ethibond interrupted sutures. The umbilicus was secured back to the fascia using 2-0 Vicryl suture. The subcutaneous layer was reapproximated using 3-0 Vicryl interrupted sutures. The skin was closed using staples. 0.5% Sensorcaine was instilled the surrounding wound. Betadine ointment dorsal dressing were applied.  All tape and needle counts were correct at the end of the procedure. The patient was extubated in the operating room and went back to recovery room awake in stable condition.  Complications:  None  EBL:  Minimal  Specimen:  None

## 2011-08-05 NOTE — Discharge Instructions (Signed)
Hernia Repair Care After These instructions give you information on caring for yourself after your procedure. Your doctor may also give you more specific instructions. Call your doctor if you have any problems or questions after your procedure. HOME CARE   You may have changes in your poops (bowel movements).   You may have loose or watery poop (diarrhea).   You may be not able to poop.   Your bowels will slowly get back to normal.   Do not eat any food that makes you sick to your stomach (nauseous). Eat small meals 4 to 6 times a day instead of 3 large ones.   Do not drink pop. It will give you gas.   Do not drink alcohol.   Do not lift anything heavier than 10 pounds. This is about the weight of a gallon of milk.   Do not do anything that makes you very tired for at least 6 weeks.   Do not get your wound wet for 2 days.   You may take a sponge bath during this time.   After 2 days you may take a shower. Gently pat your surgical cut (incision) dry with a towel. Do not rub it.   For men: You may have been given an athletic supporter (scrotal support) before you left the hospital. It holds your scrotum and testicles closer to your body so there is no strain on your wound. Wear the supporter until your doctor tells you that you do not need it anymore.  GET HELP RIGHT AWAY IF:  You have watery poop, or cannot poop for more than 3 days.   You feel sick to your stomach or throw up (vomit) more than 2 or 3 times.   You have temperature by mouth above 102 F (38.9 C).   You see redness or puffiness (swelling) around your wound.   You see yellowish white fluid (pus) coming from your wound.   You see a bulge or bump in your lower belly (abdomen) or near your groin.   You develop a rash, trouble breathing, or any other symptoms from medicines taken.  MAKE SURE YOU:  Understand these instructions.   Will watch your condition.   Will get help right away if your are not doing  well or get worse.  Document Released: 03/12/2008 Document Revised: 03/19/2011 Document Reviewed: 03/12/2008 [employer] Patient Information 2012 [employer]. 

## 2011-08-05 NOTE — Interval H&P Note (Signed)
History and Physical Interval Note:  08/05/2011 10:04 AM  Calvin Henry  has presented today for surgery, with the diagnosis of Umbilical hernia   The various methods of treatment have been discussed with the patient and family. After consideration of risks, benefits and other options for treatment, the patient has consented to  Procedure(s) (LRB): HERNIA REPAIR UMBILICAL ADULT ([name]) as a surgical intervention .  The patients' history has been reviewed, patient examined, no change in status, stable for surgery.  I have reviewed the patients' chart and labs.  Questions were answered to the patient's satisfaction.     Franky Macho A

## 2011-08-07 ENCOUNTER — Encounter (HOSPITAL_COMMUNITY): Payer: Self-pay | Admitting: General Surgery

## 2011-08-08 ENCOUNTER — Encounter (HOSPITAL_COMMUNITY): Payer: Self-pay | Admitting: *Deleted

## 2011-08-08 ENCOUNTER — Emergency Department (HOSPITAL_COMMUNITY)
Admission: EM | Admit: 2011-08-08 | Discharge: 2011-08-08 | Disposition: A | Discharge status: Home / Self Care | Payer: 59 | Attending: Emergency Medicine | Admitting: Emergency Medicine

## 2011-08-08 DIAGNOSIS — K5900 Constipation, unspecified: Secondary | ICD-10-CM | POA: Insufficient documentation

## 2011-08-08 DIAGNOSIS — Z8249 Family history of ischemic heart disease and other diseases of the circulatory system: Secondary | ICD-10-CM | POA: Insufficient documentation

## 2011-08-08 DIAGNOSIS — G8929 Other chronic pain: Secondary | ICD-10-CM | POA: Insufficient documentation

## 2011-08-08 DIAGNOSIS — Z833 Family history of diabetes mellitus: Secondary | ICD-10-CM | POA: Insufficient documentation

## 2011-08-08 DIAGNOSIS — M549 Dorsalgia, unspecified: Secondary | ICD-10-CM | POA: Insufficient documentation

## 2011-08-08 DIAGNOSIS — I10 Essential (primary) hypertension: Secondary | ICD-10-CM | POA: Insufficient documentation

## 2011-08-08 DIAGNOSIS — Z9889 Other specified postprocedural states: Secondary | ICD-10-CM | POA: Insufficient documentation

## 2011-08-08 DIAGNOSIS — R1013 Epigastric pain: Secondary | ICD-10-CM | POA: Insufficient documentation

## 2011-08-08 DIAGNOSIS — G4730 Sleep apnea, unspecified: Secondary | ICD-10-CM | POA: Insufficient documentation

## 2011-08-08 DIAGNOSIS — K3189 Other diseases of stomach and duodenum: Secondary | ICD-10-CM | POA: Insufficient documentation

## 2011-08-08 DIAGNOSIS — Z87891 Personal history of nicotine dependence: Secondary | ICD-10-CM | POA: Insufficient documentation

## 2011-08-08 NOTE — Discharge Instructions (Signed)
Constipation in Adults Constipation is having fewer than 2 bowel movements per week. Usually, the stools are hard. As we grow older, constipation is more common. If you try to fix constipation with laxatives, the problem may get worse. This is because laxatives taken over a long period of time make the colon muscles weaker. A low-fiber diet, not taking in enough fluids, and taking some medicines may make these problems worse. MEDICATIONS THAT MAY CAUSE CONSTIPATION  Water pills (diuretics).   Calcium channel blockers (used to control blood pressure and for the heart).   Certain pain medicines (narcotics).   Anticholinergics.   Anti-inflammatory agents.   Antacids that contain aluminum.  DISEASES THAT CONTRIBUTE TO CONSTIPATION  Diabetes.   Parkinson's disease.   Dementia.   Stroke.   Depression.   Illnesses that cause problems with salt and water metabolism.  HOME CARE INSTRUCTIONS   Constipation is usually best cared for without medicines. Increasing dietary fiber and eating more fruits and vegetables is the best way to manage constipation.   Slowly increase fiber intake to 25 to 38 grams per day. Whole grains, fruits, vegetables, and legumes are good sources of fiber. A dietitian can further help you incorporate high-fiber foods into your diet.   Drink enough water and fluids to keep your urine clear or pale yellow.   A fiber supplement may be added to your diet if you cannot get enough fiber from foods.   Increasing your activities also helps improve regularity.   Suppositories, as suggested by your caregiver, will also help. If you are using antacids, such as aluminum or calcium containing products, it will be helpful to switch to products containing magnesium if your caregiver says it is okay.   If you have been given a liquid injection (enema) today, this is only a temporary measure. It should not be relied on for treatment of longstanding (chronic) constipation.    Stronger measures, such as magnesium sulfate, should be avoided if possible. This may cause uncontrollable diarrhea. Using magnesium sulfate may not allow you time to make it to the bathroom.  SEEK IMMEDIATE MEDICAL CARE IF:   There is bright red blood in the stool.   The constipation stays for more than 4 days.   There is belly (abdominal) or rectal pain.   You do not seem to be getting better.   You have any questions or concerns.  MAKE SURE YOU:   Understand these instructions.   Will watch your condition.   Will get help right away if you are not doing well or get worse.  Document Released: 12/27/2003 Document Revised: 03/19/2011 Document Reviewed: 03/03/2011 [facility] Patient Information 2012 [place], [place].  Recommend of trial of 2 fleets enemas at home today. Also continue the Dulcolax on a regular basis. Increase fluids. Return here or followup with your surgeon for any nausea or vomiting.

## 2011-08-08 NOTE — ED Provider Notes (Signed)
Evaluation and management procedures were performed by the PA/NP/resident physician under my supervision/collaboration.   Aslin Farinas D Devonta Blanford, MD 08/08/11 2049 

## 2011-08-08 NOTE — ED Notes (Signed)
Hernia repair x 3 days ago - unable to have BM x 4 days.  Last normal BM x 4 days ago.

## 2011-08-08 NOTE — ED Provider Notes (Signed)
History     CSN: [id number]  Arrival date & time 08/08/11  0825   First MD Initiated Contact with Patient 08/08/11 0827      Chief Complaint  Patient presents with  . Constipation    (Consider location/radiation/quality/duration/timing/severity/associated sxs/prior treatment) Patient is a 51 y.o. male presenting with constipation. The history is provided by the patient and a relative.  Constipation  The current episode started 3 to 5 days ago (No bowel movement since surgery 3 days ago.). The problem occurs continuously. The problem has been unchanged. The pain is mild. There was no prior successful therapy. Associated symptoms include abdominal pain. Pertinent negatives include no fever, no diarrhea, no hematemesis, no nausea, no rectal pain, no vomiting, no chest pain, no headaches, no difficulty breathing and no rash.   Status post umbilical hernia repair by Dr. Lovell Sheehan 3 days ago. Patient has not had a bowel movement since then. No nausea no vomiting. Patient was not put on a stool softener regimen post surgery but did try one Dulcolax pill just last evening.  Past Medical History  Diagnosis Date  . Hypertension   . Acid reflux   . Chronic back pain   . Sleep apnea     STOP BANG score: 6    Past Surgical History  Procedure Date  . Esophagogastroduodenoscopy 08/07/10    schatzkis ring otherwise normal, s/p 56-F dilation, small hiatal hernia  . Colonoscopy 08/07/10    friable anal canal otherwise normal  . No past surgeries   . Umbilical hernia repair 08/05/2011    Procedure: HERNIA REPAIR UMBILICAL ADULT;  Surgeon: Dalia Heading, MD;  Location: [facility];  Service: General;  Laterality: [name];  . Hernia repair     Family History  Problem Relation Age of Onset  . Diabetes Mother   . Hypertension Mother     History  Substance Use Topics  . Smoking status: Former Smoker -- 0.5 packs/day for 10 years    Types: Cigarettes    Quit date: 12/01/1986  . Smokeless tobacco:  Never Used  . Alcohol Use: 0.0 oz/week     Occ      Review of Systems  Constitutional: Negative for fever.  HENT: Negative for congestion and neck pain.   Eyes: Negative for redness.  Respiratory: Negative for shortness of breath.   Cardiovascular: Negative for chest pain.  Gastrointestinal: Positive for abdominal pain and constipation. Negative for nausea, vomiting, diarrhea, rectal pain and hematemesis.  Genitourinary: Negative for dysuria.  Skin: Negative for rash.  Neurological: Negative for headaches.  Hematological: Does not bruise/bleed easily.    Allergies  Review of patient's allergies indicates no known allergies.  Home Medications   Current Outpatient Rx  Name Route Sig Dispense Refill  . AMLODIPINE BESYLATE-VALSARTAN 10-160 MG PO TABS Oral Take 1 tablet by mouth daily.      . ASPIRIN EC 81 MG PO TBEC Oral Take 81 mg by mouth daily.     Marland Kitchen FISH OIL PO Oral Take 1 capsule by mouth daily.     Marland Kitchen OMEPRAZOLE 20 MG PO CPDR Oral Take 1 capsule (20 mg total) by mouth daily. 30 capsule 3  . OXYCODONE-ACETAMINOPHEN 7.5-325 MG PO TABS Oral Take 1-2 tablets by mouth every 4 (four) hours as needed for pain. Pain 40 tablet 0    BP 144/91  Pulse 96  Temp(Src) 98.2 F (36.8 C) (Oral)  Resp 18  Ht 5\' 6"  (1.676 m)  Wt 257 lb (116.574 kg)  BMI  41.48 kg/m2  SpO2 96%  Physical Exam  Nursing note and vitals reviewed. Constitutional: He is oriented to person, place, and time. He appears well-developed and well-nourished. No distress.  HENT:  Head: Normocephalic and atraumatic.  Eyes: Conjunctivae and EOM are normal. Pupils are equal, round, and reactive to light.  Neck: Normal range of motion. Neck supple.  Cardiovascular: Normal rate, regular rhythm and normal heart sounds.   No murmur heard. Pulmonary/Chest: Effort normal and breath sounds normal. He has no wheezes.  Abdominal: Soft. Bowel sounds are normal. There is no tenderness.       Normal bowel sounds. Surgical  incision just below the umbilicus is healing well without evidence of infection surgical staples in place.  Musculoskeletal: Normal range of motion.  Neurological: He is alert and oriented to person, place, and time. No cranial nerve deficit. He exhibits normal muscle tone. Coordination normal.  Skin: Skin is warm. No rash noted.    ED Course  Procedures (including critical care time)  Labs Reviewed - No data to display No results found.   1. Constipation       MDM  Patient status post umbilical hernia repair on Wednesday which would be 3 days ago. Patient without a bowel movement since then but not having to strain hard to have one. To try one Dulcolax at home was not put on a stool softener regimen post surgery. Has not tried any other medications to help with the constipation. Patient is on Percocet this could be contributing to the constipation. Patient's umbilical hernia repair wound is healing nicely without signs of infection surgical staples are still in place. Patient is passing gas has strong bowel sounds no nausea or vomiting. Will have patient try 2 fleets enemas at home and continue the Dulcolax. Also recommend patient increase fluids.        Shelda Jakes, MD 08/08/11 [phone]

## 2011-08-17 ENCOUNTER — Encounter: Payer: Self-pay | Admitting: Gastroenterology

## 2011-08-17 ENCOUNTER — Ambulatory Visit (INDEPENDENT_AMBULATORY_CARE_PROVIDER_SITE_OTHER): Payer: 59 | Admitting: Gastroenterology

## 2011-08-17 VITALS — BP 126/77 | HR 96 | Temp 98.3°F | Ht 66.0 in | Wt 259.2 lb

## 2011-08-17 DIAGNOSIS — K219 Gastro-esophageal reflux disease without esophagitis: Secondary | ICD-10-CM

## 2011-08-17 DIAGNOSIS — K625 Hemorrhage of anus and rectum: Secondary | ICD-10-CM

## 2011-08-17 NOTE — Progress Notes (Signed)
Faxed to PCP

## 2011-08-17 NOTE — Assessment & Plan Note (Signed)
Controlled with Prilosec daily. No dysphagia. Continue PPI.  1 year f/u with Dr. Jena Gauss

## 2011-08-17 NOTE — Patient Instructions (Signed)
Please call us if you have any further rectal bleeding.  Great job on sticking with the high fiber diet! If you need to take something for constipation, you may use Miralax on any given day that you need help with a bowel movement. It appears you are doing great with the diet alone, which is wonderful!  Continue taking Prilosec. Your pharmacy will send [name] notification if you need further refills.   We will see you back in a year or sooner if needed!

## 2011-08-17 NOTE — Progress Notes (Signed)
Referring Provider: Kirk Ruths, MD Primary Care Physician:  Kirk Ruths, MD, MD\ Primary Gastroenterologist: Dr. Jena Gauss   Chief Complaint  Patient presents with  . Follow-up    HPI:   [name] returns today in follow-up for intermittent low-volume hematochezia, previously refused rectal exam. Known friable anal canal on TCS April 2012. Recently underwent umbilical hernia repair by Dr. Lovell Sheehan. Most recent CBC normal.  Constipation improved, was seen in ED shortly after umbilical hernia repair, took Dulcolax and fleet enema. No further rectal bleeding. No rectal discomfort. GERD controlled. Prilosec daily. No dysphagia. Left-sided rib discomfort improved.    Past Medical History  Diagnosis Date  . Hypertension   . Acid reflux   . Chronic back pain   . Sleep apnea     STOP BANG score: 6    Past Surgical History  Procedure Date  . Esophagogastroduodenoscopy 08/07/10    schatzkis ring otherwise normal, s/p 56-F dilation, small hiatal hernia  . Colonoscopy 08/07/10    friable anal canal otherwise normal  . No past surgeries   . Umbilical hernia repair 08/05/2011    Procedure: HERNIA REPAIR UMBILICAL ADULT;  Surgeon: Dalia Heading, MD;  Location: [facility];  Service: General;  Laterality: [name];  . Hernia repair     Current Outpatient Prescriptions  Medication Sig Dispense Refill  . amLODipine-valsartan (EXFORGE) 10-160 MG per tablet Take 1 tablet by mouth daily.        Marland Kitchen aspirin EC 81 MG tablet Take 81 mg by mouth daily.       . Omega-3 Fatty Acids (FISH OIL PO) Take 1 capsule by mouth daily.       Marland Kitchen omeprazole (PRILOSEC) 20 MG capsule Take 1 capsule (20 mg total) by mouth daily.  30 capsule  3  . oxyCODONE-acetaminophen (PERCOCET) 7.5-325 MG per tablet Take 1-2 tablets by mouth every 4 (four) hours as needed for pain. Pain  40 tablet  0    Allergies as of 08/17/2011  . (No Known Allergies)    Family History  Problem Relation Age of Onset  . Diabetes Mother   .  Hypertension Mother     History   Social History  . Marital Status: Married    Spouse Name: [name]    Number of Children: [name]  . Years of Education: [name]   Social History Main Topics  . Smoking status: Former Smoker -- 0.5 packs/day for 10 years    Types: Cigarettes    Quit date: 12/01/1986  . Smokeless tobacco: Never Used  . Alcohol Use: 0.0 oz/week     Occ  . Drug Use: No     remote marijuana use   . Sexually Active: Yes   Other Topics Concern  . None   Social History Narrative  . None    Review of Systems: Gen: Denies fever, chills, anorexia. Denies fatigue, weakness, weight loss.  CV: Denies chest pain, palpitations, syncope, peripheral edema, and claudication. Resp: Denies dyspnea at rest, cough, wheezing, coughing up blood, and pleurisy. GI: Denies vomiting blood, jaundice, and fecal incontinence.   Denies dysphagia or odynophagia. Derm: Denies rash, itching, dry skin Psych: Denies depression, anxiety, memory loss, confusion. No homicidal or suicidal ideation.  Heme: Denies bruising, bleeding, and enlarged lymph nodes.  Physical Exam: BP 126/77  Pulse 96  Temp(Src) 98.3 F (36.8 C) (Temporal)  Ht 5\' 6"  (1.676 m)  Wt 259 lb 3.2 oz (117.572 kg)  BMI 41.84 kg/m2 General:   Alert and oriented. No  distress noted. Pleasant and cooperative.  Head:  Normocephalic and atraumatic. Eyes:  Conjuctiva clear without scleral icterus. Neck:  Supple, without mass or thyromegaly. Heart:  S1, S2 present without murmurs, rubs, or gallops. Regular rate and rhythm. Abdomen:  Limited exam, pt sitting up on exam table, s/p umb hernia repair recently, difficult to lay down/sit up with ease. Incision site C/D/I, no erythema, edema, exudate. +BS, soft, TTP. Non-distended.  Msk:  Symmetrical without gross deformities. Normal posture. Extremities:  Without edema. Neurologic:  Alert and  oriented x4;  grossly normal neurologically. Skin:  Intact without significant lesions or  rashes. Cervical Nodes:  No significant cervical adenopathy. Psych:  Alert and cooperative. Normal mood and affect.

## 2011-08-17 NOTE — Assessment & Plan Note (Signed)
Hx of friable anal canal on TCS one year ago, no further rectal bleeding since Anusol suppositories provided in March. Consuming high fiber diet, no difficulty with constipation other than shortly post-op from hernia repair, likely secondary to narcotics.   Return in 1 year HF diet Contact us if any further rectal bleeding Miralax prn constipation. Wife present at time of visit.

## 2011-08-17 NOTE — Assessment & Plan Note (Signed)
Resolved

## 2011-11-23 ENCOUNTER — Other Ambulatory Visit: Payer: Self-pay | Admitting: Gastroenterology

## 2011-12-08 ENCOUNTER — Emergency Department (HOSPITAL_COMMUNITY)
Admission: EM | Admit: 2011-12-08 | Discharge: 2011-12-08 | Disposition: A | Discharge status: Home / Self Care | Payer: 59 | Attending: Emergency Medicine | Admitting: Emergency Medicine

## 2011-12-08 ENCOUNTER — Encounter (HOSPITAL_COMMUNITY): Payer: Self-pay | Admitting: Emergency Medicine

## 2011-12-08 DIAGNOSIS — I10 Essential (primary) hypertension: Secondary | ICD-10-CM | POA: Insufficient documentation

## 2011-12-08 DIAGNOSIS — B354 Tinea corporis: Secondary | ICD-10-CM | POA: Insufficient documentation

## 2011-12-08 DIAGNOSIS — Z8249 Family history of ischemic heart disease and other diseases of the circulatory system: Secondary | ICD-10-CM | POA: Insufficient documentation

## 2011-12-08 DIAGNOSIS — K219 Gastro-esophageal reflux disease without esophagitis: Secondary | ICD-10-CM | POA: Insufficient documentation

## 2011-12-08 DIAGNOSIS — Z87891 Personal history of nicotine dependence: Secondary | ICD-10-CM | POA: Insufficient documentation

## 2011-12-08 DIAGNOSIS — Z833 Family history of diabetes mellitus: Secondary | ICD-10-CM | POA: Insufficient documentation

## 2011-12-08 MED ORDER — CLOTRIMAZOLE-BETAMETHASONE 1-0.05 % EX CREA: TOPICAL_CREAM | CUTANEOUS | Status: AC

## 2011-12-08 NOTE — Discharge Instructions (Signed)
Fungus Infection of the Skin An infection of your skin caused by a fungus is a very common problem. Treatment depends on which part of the body is affected. Types of fungal skin infection include:  Athlete's Foot(Tinea pedis). This infection starts between the toes and may involve the entire sole and sides of foot. It is the most common fungal disease. It is made worse by heat, moisture, and friction. To treat, wash your feet 2 to 3 times daily. Dry thoroughly between the toes. Use medicated foot powder or cream as directed on the package. Plain talc, cornstarch, or rice powder may be dusted into socks and shoes to keep the feet dry. Wearing footwear that allows ventilation is also helpful.   Ringworm (Tinea corporis and tinea capitis). This infection causes scaly red rings to form on the skin or scalp. For skin sores, apply medicated lotion or cream as directed on the package. For the scalp, medicated shampoo may be used with with other therapies. Ringworm of the scalp or fingernails usually requires using oral medicine for 2 to 4 months.   Tinea versicolor. This infection appears as painless, scaly, patchy areas of discolored skin (whitish to light brown). It is more common in the summer and favors oily areas of the skin such as those found at the chest, abdomen, back, pubis, neck, and body folds. It can be treated with medicated shampoo or with medicated topical cream. Oral antifungals may be needed for more active infections. The light and/or dark spots may take time to get better and is not a sign of treatment failure.  Fungal infections may need to be treated for several weeks to be cured. It is important not to treat fungal infections with steroids or combination medicine that contains an antifungal and steroid as these will make the fungal infection worse. SEEK MEDICAL CARE IF:   You have persistent itching or rawness.   You have an oral temperature above 102 F (38.9 C).  Document Released:  05/07/2004 Document Revised: 03/19/2011 Document Reviewed: 07/23/2009 [facility] Patient Information 2012 [place], [place].   You may also use benadryl if needed for itching,  Although the prescription should also help with itching.

## 2011-12-08 NOTE — ED Notes (Signed)
Pt presents with several lesions on left upper leg. Pt states they have been present for 2 months and "keep spreading". Pt states the areas are itchy and tender to touch.

## 2011-12-08 NOTE — ED Notes (Signed)
Patient with several areas on legs, appear to be lesions for several months. C/o itching to are.

## 2011-12-09 NOTE — ED Provider Notes (Signed)
History     CSN: [id number]  Arrival date & time 12/08/11  1653   First MD Initiated Contact with Patient 12/08/11 1733      Chief Complaint  Patient presents with  . Pruritis    (Consider location/radiation/quality/duration/timing/severity/associated sxs/prior treatment) HPI Comments: Calvin Henry presents with an itchy rash which has been present on his right upper anterior thigh and slowly spreading for the past several months.  He has tried a prescription cream given by his pcp which has not helped (and he does not know the name or type of medication this was).  He denies fevers,  Chills,  Nausea, vomiting.  No other family members have similar rash and he does not recognize any new chemical or environmental exposures. Itching is nearly constant, he denies pain.  The history is provided by the patient.    Past Medical History  Diagnosis Date  . Hypertension   . Acid reflux   . Chronic back pain   . Sleep apnea     STOP BANG score: 6    Past Surgical History  Procedure Date  . Esophagogastroduodenoscopy 08/07/10    schatzkis ring otherwise normal, s/p 56-F dilation, small hiatal hernia  . Colonoscopy 08/07/10    friable anal canal otherwise normal  . No past surgeries   . Umbilical hernia repair 08/05/2011    Procedure: HERNIA REPAIR UMBILICAL ADULT;  Surgeon: Dalia Heading, MD;  Location: [facility];  Service: General;  Laterality: [name];  . Hernia repair     Family History  Problem Relation Age of Onset  . Diabetes Mother   . Hypertension Mother     History  Substance Use Topics  . Smoking status: Former Smoker -- 0.5 packs/day for 10 years    Types: Cigarettes    Quit date: 12/01/1986  . Smokeless tobacco: Never Used  . Alcohol Use: 0.0 oz/week     Occ      Review of Systems  Constitutional: Negative for fever and chills.  HENT: Negative for facial swelling.   Respiratory: Negative for shortness of breath and wheezing.   Skin: Positive for rash.    Neurological: Negative for numbness.    Allergies  Review of patient's allergies indicates no known allergies.  Home Medications   Current Outpatient Rx  Name Route Sig Dispense Refill  . AMLODIPINE BESYLATE-VALSARTAN 10-160 MG PO TABS Oral Take 1 tablet by mouth daily.      . ASPIRIN EC 81 MG PO TBEC Oral Take 81 mg by mouth daily.     Marland Kitchen CLOTRIMAZOLE-BETAMETHASONE 1-0.05 % EX CREA  Apply to affected area 2 times daily for 2 weeks 30 g 0  . FISH OIL PO Oral Take 1 capsule by mouth daily.     Marland Kitchen OMEPRAZOLE 20 MG PO CPDR  TAKE 1 CAPSULE BY MOUTH DAILY. 30 capsule 5    Generic [id number]:[id number]    20MG   . OXYCODONE-ACETAMINOPHEN 7.5-325 MG PO TABS Oral Take 1-2 tablets by mouth every 4 (four) hours as needed for pain. Pain 40 tablet 0    BP 124/82  Pulse 85  Temp 98.3 F (36.8 C) (Oral)  Resp 16  Ht 5\' 6"  (1.676 m)  Wt 256 lb (116.121 kg)  BMI 41.32 kg/m2  SpO2 97%  Physical Exam  Constitutional: He appears well-developed and well-nourished. No distress.  HENT:  Head: Normocephalic.  Neck: Neck supple.  Cardiovascular: Normal rate.   Pulmonary/Chest: Effort normal. He has no wheezes.  Musculoskeletal: Normal range of  motion. He exhibits no edema.  Skin: Rash noted. Rash is macular.       5 coin shaped slightly scaly raised lesions right upper anterior thigh. Largest is 3 cm diameter, the satellite lesions are about 1 cm average.  There is no drainage, erythema , fluctuance or induration,  No excoriations.  There is no central clearing.    ED Course  Procedures (including critical care time)  Labs Reviewed - No data to display No results found.   1. Tinea corporis       MDM  Suspect tinea.  Lotrisone prescribed.  Recheck by pcp if not improving over 2 weeks.  Also given dermatology referral prn.        Burgess Amor, [place] 12/09/11 1411

## 2011-12-11 NOTE — ED Provider Notes (Signed)
Medical screening examination/treatment/procedure(s) were performed by non-physician practitioner and as supervising physician I was immediately available for consultation/collaboration.   Kuulei Kleier L Ancelmo Hunt, MD 12/11/11 1814 

## 2012-02-25 IMAGING — CT CT ABD-PELV W/O CM
2 of 4 series · 17 of 46 positions shown, 19 images · non-contrast
Comparison: None

CLINICAL DATA: Left flank pain, hematuria

CT ABDOMEN AND PELVIS WITHOUT CONTRAST
TECHNIQUE: Multidetector CT imaging of the abdomen and pelvis was
performed following the standard protocol without intravenous
contrast. Sagittal and coronal MPR images reconstructed from axial
data set.

[Series 2: standard/full over (age)lbs 5.0 · axial · 0.93mm/px · z∈[-402,+8]mm · 14 of 90 slices shown, 16 images]
[im 4/90  soft-tissue]
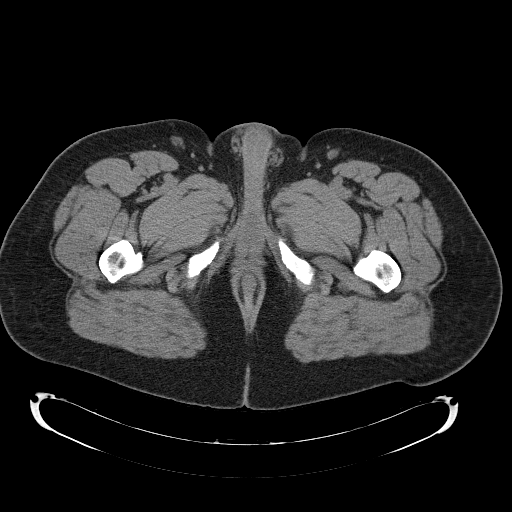
[im 4/90  bone]
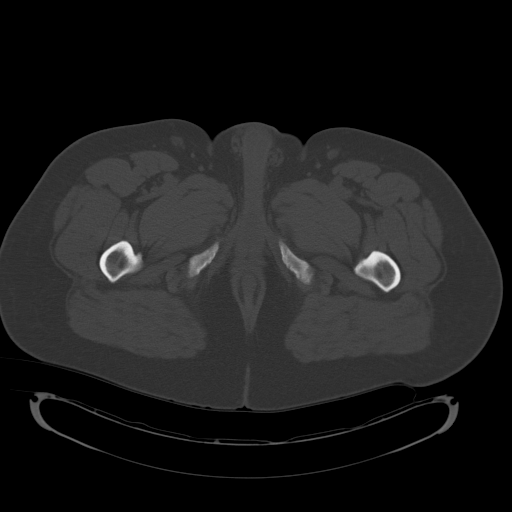
[im 12/90  soft-tissue]
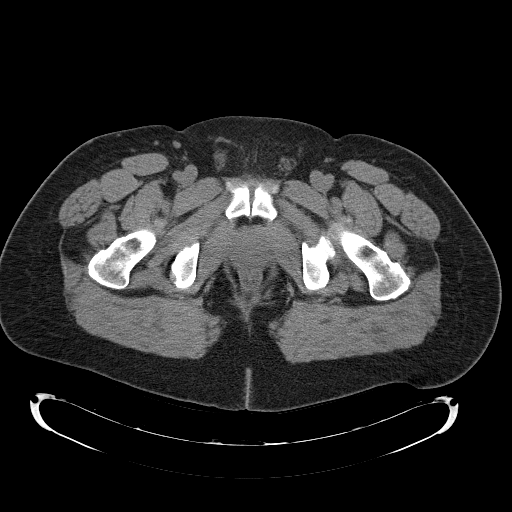
[im 16/90  soft-tissue]
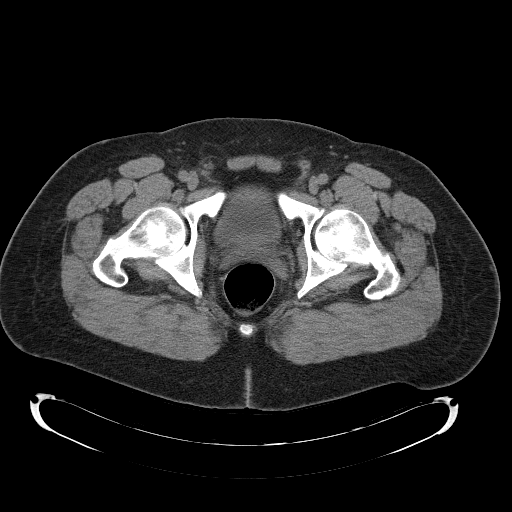
[im 24/90  soft-tissue]
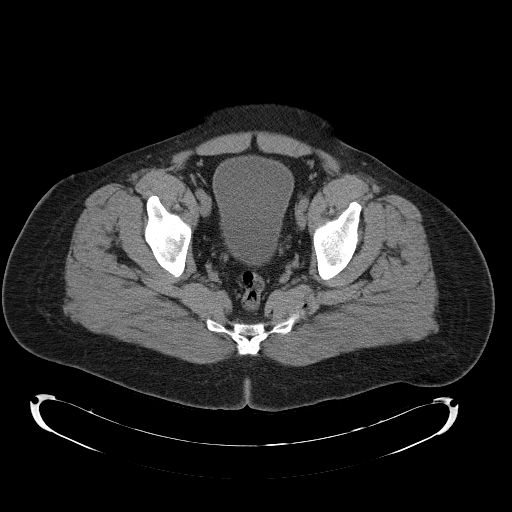
[im 31/90  soft-tissue]
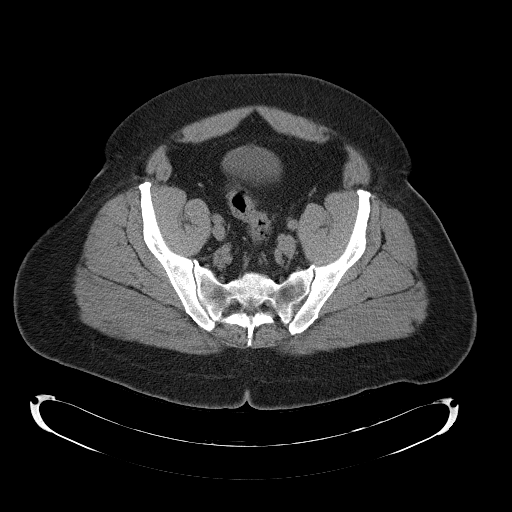
[im 35/90  soft-tissue]
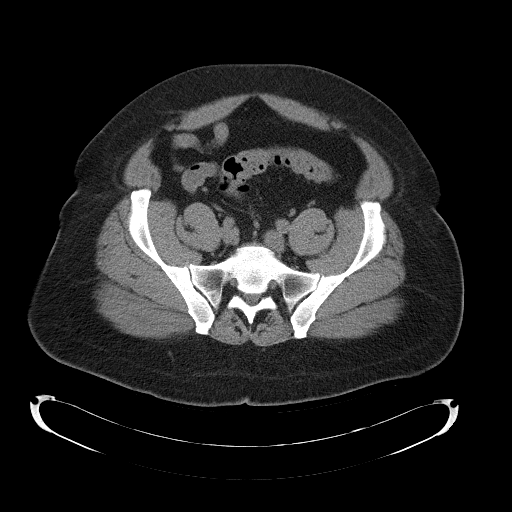
[im 43/90  soft-tissue]
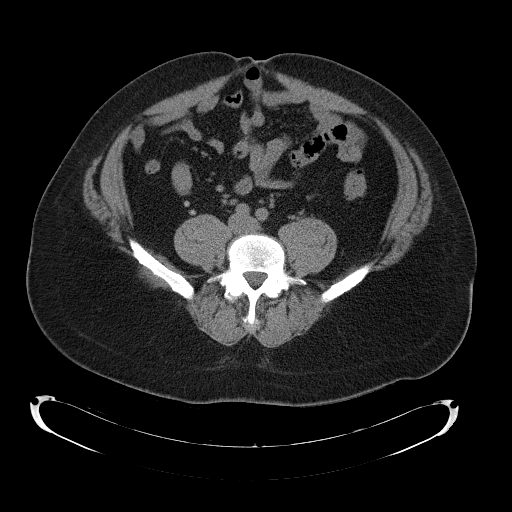
[im 47/90  soft-tissue]
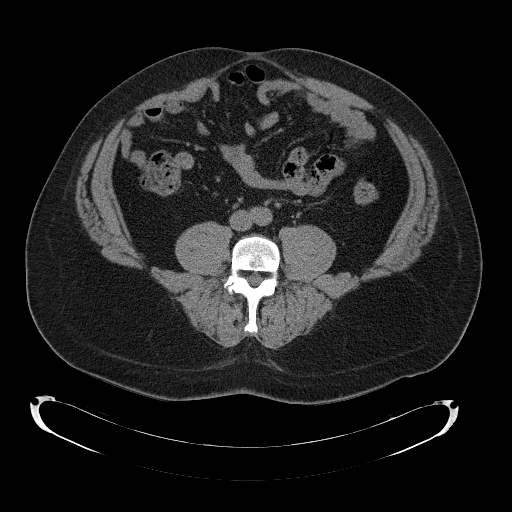
[im 55/90  soft-tissue]
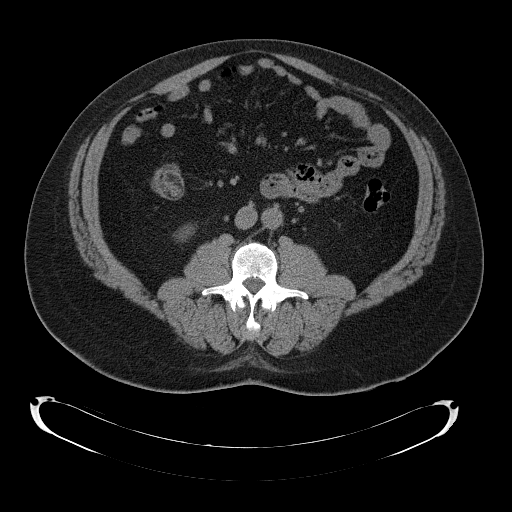
[im 55/90  bone]
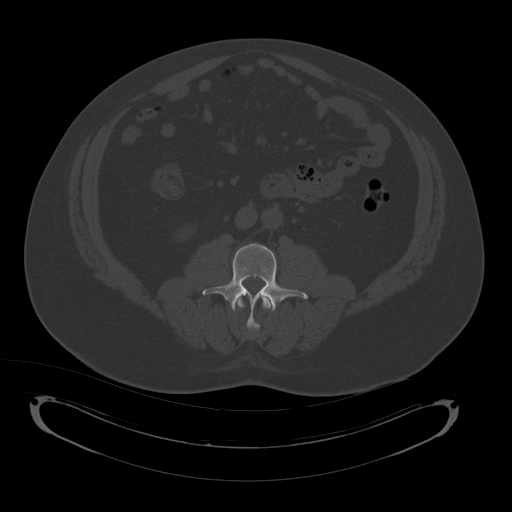
[im 59/90  soft-tissue]
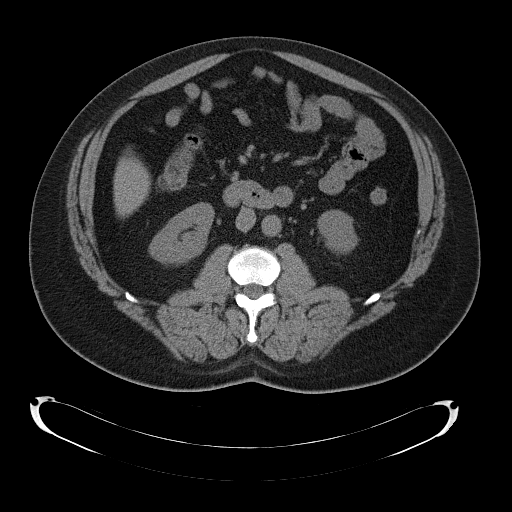
[im 66/90  soft-tissue]
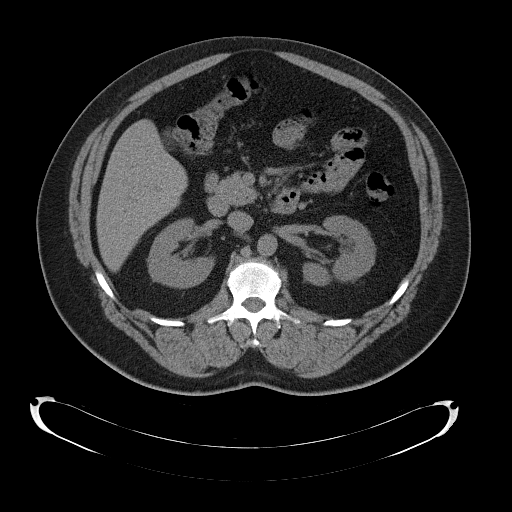
[im 74/90  soft-tissue]
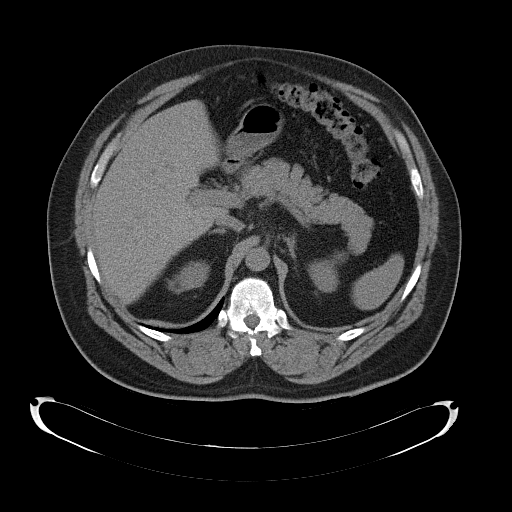
[im 78/90  soft-tissue]
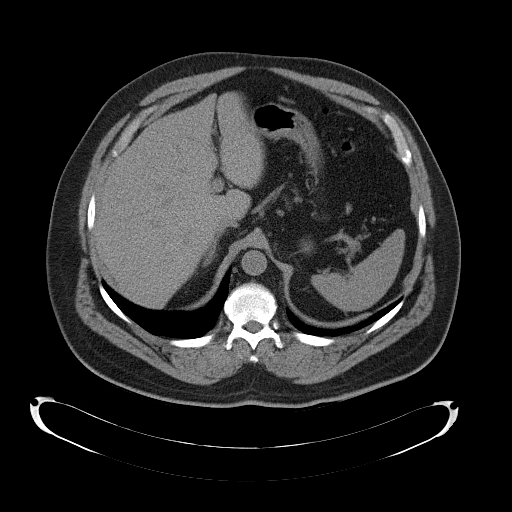
[im 86/90  soft-tissue]
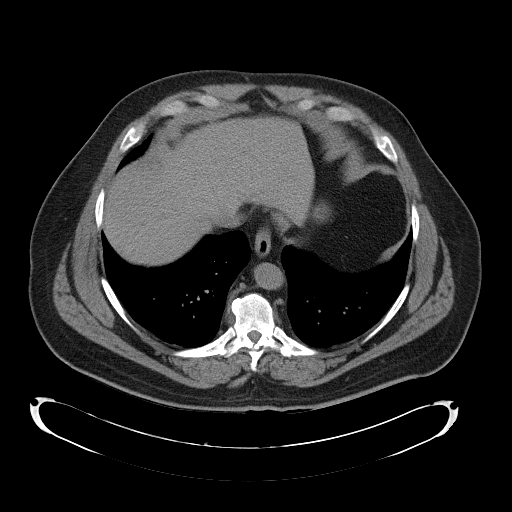

[Series 4: mpr coronal · coronal · 0.88mm/px · 3 of 98 slices shown]
[im 33/98  soft-tissue]
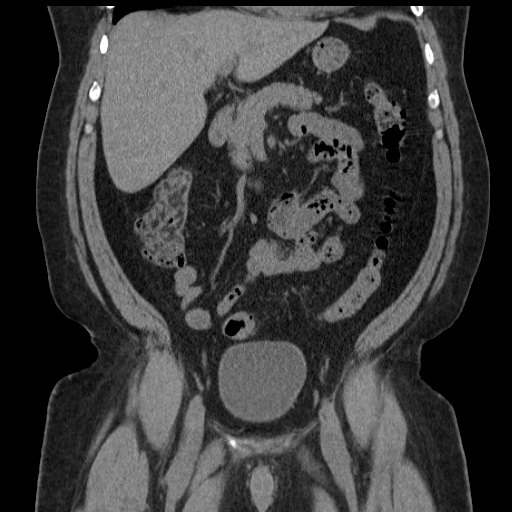
[im 44/98  soft-tissue]
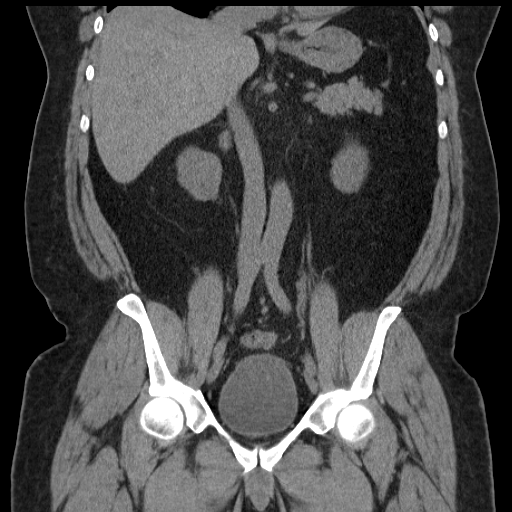
[im 54/98  soft-tissue]
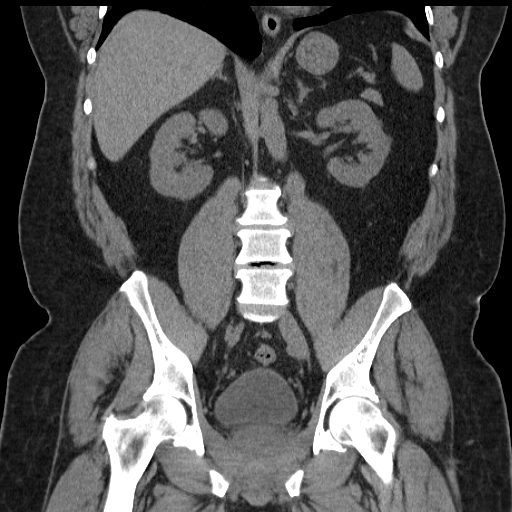

[17 of 46 positions shown; findings below may reference images not displayed]

FINDINGS: Minimal atelectasis right lung base.
No hydronephrosis, ureteral dilatation, or urinary tract
calcification.
Within limits of a exam lacking IV contrast, no focal abnormalities
of the liver, spleen, pancreas, kidneys, or adrenal glands
identified.
Tiny umbilical hernia containing fat.
Stomach and bowel loops grossly unremarkable for technique.
No definite mass, adenopathy, free fluid or inflammatory process.
No acute osseous findings.
Degenerative disc disease changes with endplate spur formation and
vacuum phenomenon at L4-L5 and L5-S1.
IMPRESSION: No acute intra abdominal or intrapelvic abnormalities identified by
noncontrast CT.
Tiny umbilical hernia containing fat.

## 2012-04-01 ENCOUNTER — Other Ambulatory Visit: Payer: Self-pay | Admitting: Urology

## 2012-04-01 ENCOUNTER — Ambulatory Visit (INDEPENDENT_AMBULATORY_CARE_PROVIDER_SITE_OTHER): Payer: 59 | Admitting: Urology

## 2012-04-01 DIAGNOSIS — Z3141 Encounter for fertility testing: Secondary | ICD-10-CM

## 2012-04-01 DIAGNOSIS — N4340 Spermatocele of epididymis, unspecified: Secondary | ICD-10-CM

## 2012-04-01 DIAGNOSIS — E291 Testicular hypofunction: Secondary | ICD-10-CM

## 2012-04-04 ENCOUNTER — Other Ambulatory Visit (HOSPITAL_COMMUNITY): Payer: 59

## 2012-04-04 ENCOUNTER — Ambulatory Visit (HOSPITAL_COMMUNITY)
Admission: RE | Admit: 2012-04-04 | Discharge: 2012-04-04 | Disposition: A | Discharge status: Home / Self Care | Payer: 59 | Source: Ambulatory Visit | Attending: Urology | Admitting: Urology

## 2012-04-04 ENCOUNTER — Other Ambulatory Visit (HOSPITAL_COMMUNITY): Payer: Self-pay | Admitting: Urology

## 2012-04-04 DIAGNOSIS — N4340 Spermatocele of epididymis, unspecified: Secondary | ICD-10-CM

## 2012-05-30 ENCOUNTER — Other Ambulatory Visit: Payer: Self-pay | Admitting: Gastroenterology

## 2012-06-27 ENCOUNTER — Other Ambulatory Visit: Payer: Self-pay | Admitting: Family Medicine

## 2012-06-27 DIAGNOSIS — M792 Neuralgia and neuritis, unspecified: Secondary | ICD-10-CM

## 2012-07-01 ENCOUNTER — Ambulatory Visit: Payer: 59 | Admitting: Urology

## 2012-07-01 ENCOUNTER — Ambulatory Visit
Admission: RE | Admit: 2012-07-01 | Discharge: 2012-07-01 | Disposition: A | Discharge status: Home / Self Care | Payer: 59 | Source: Ambulatory Visit | Attending: Family Medicine | Admitting: Family Medicine

## 2012-07-01 DIAGNOSIS — M792 Neuralgia and neuritis, unspecified: Secondary | ICD-10-CM

## 2012-09-06 ENCOUNTER — Encounter: Payer: Self-pay | Admitting: General Practice

## 2013-03-27 ENCOUNTER — Encounter (HOSPITAL_COMMUNITY): Payer: Self-pay | Admitting: Emergency Medicine

## 2013-03-27 ENCOUNTER — Emergency Department (HOSPITAL_COMMUNITY)
Admission: EM | Admit: 2013-03-27 | Discharge: 2013-03-27 | Disposition: A | Discharge status: Home / Self Care | Payer: 59 | Attending: Emergency Medicine | Admitting: Emergency Medicine

## 2013-03-27 DIAGNOSIS — Z79899 Other long term (current) drug therapy: Secondary | ICD-10-CM | POA: Insufficient documentation

## 2013-03-27 DIAGNOSIS — R52 Pain, unspecified: Secondary | ICD-10-CM | POA: Insufficient documentation

## 2013-03-27 DIAGNOSIS — Z7982 Long term (current) use of aspirin: Secondary | ICD-10-CM | POA: Insufficient documentation

## 2013-03-27 DIAGNOSIS — M5431 Sciatica, right side: Secondary | ICD-10-CM

## 2013-03-27 DIAGNOSIS — I10 Essential (primary) hypertension: Secondary | ICD-10-CM | POA: Insufficient documentation

## 2013-03-27 DIAGNOSIS — Z87891 Personal history of nicotine dependence: Secondary | ICD-10-CM | POA: Insufficient documentation

## 2013-03-27 DIAGNOSIS — G8929 Other chronic pain: Secondary | ICD-10-CM | POA: Insufficient documentation

## 2013-03-27 DIAGNOSIS — M5430 Sciatica, unspecified side: Secondary | ICD-10-CM | POA: Insufficient documentation

## 2013-03-27 MED ORDER — CYCLOBENZAPRINE HCL 5 MG PO TABS: 5.0000 mg | ORAL_TABLET | Freq: Three times a day (TID) | ORAL | Status: DC | PRN

## 2013-03-27 MED ORDER — PREDNISONE 10 MG PO TABS: ORAL_TABLET | ORAL | Status: DC

## 2013-03-27 MED ORDER — OXYCODONE-ACETAMINOPHEN 5-325 MG PO TABS: 1.0000 | ORAL_TABLET | ORAL | Status: DC | PRN

## 2013-03-27 NOTE — ED Notes (Signed)
Pt seen and evaluated by EDPa for initial assessment. 

## 2013-03-27 NOTE — ED Notes (Signed)
Pt with chronic lower back pain, states hurts off and on and that he has bulging discs, denies any new injury

## 2013-03-27 NOTE — Discharge Instructions (Signed)
Sciatica Sciatica is pain, weakness, numbness, or tingling along the path of the sciatic nerve. The nerve starts in the lower back and runs down the back of each leg. The nerve controls the muscles in the lower leg and in the back of the knee, while also providing sensation to the back of the thigh, lower leg, and the sole of your foot. Sciatica is a symptom of another medical condition. For instance, nerve damage or certain conditions, such as a herniated disk or bone spur on the spine, pinch or put pressure on the sciatic nerve. This causes the pain, weakness, or other sensations normally associated with sciatica. Generally, sciatica only affects one side of the body. CAUSES   Herniated or slipped disc.  Degenerative disk disease.  A pain disorder involving the narrow muscle in the buttocks (piriformis syndrome).  Pelvic injury or fracture.  Pregnancy.  Tumor (rare). SYMPTOMS  Symptoms can vary from mild to very severe. The symptoms usually travel from the low back to the buttocks and down the back of the leg. Symptoms can include:  Mild tingling or dull aches in the lower back, leg, or hip.  Numbness in the back of the calf or sole of the foot.  Burning sensations in the lower back, leg, or hip.  Sharp pains in the lower back, leg, or hip.  Leg weakness.  Severe back pain inhibiting movement. These symptoms may get worse with coughing, sneezing, laughing, or prolonged sitting or standing. Also, being overweight may worsen symptoms. DIAGNOSIS  Your caregiver will perform a physical exam to look for common symptoms of sciatica. He or she may ask you to do certain movements or activities that would trigger sciatic nerve pain. Other tests may be performed to find the cause of the sciatica. These may include:  Blood tests.  X-rays.  Imaging tests, such as an MRI or CT scan. TREATMENT  Treatment is directed at the cause of the sciatic pain. Sometimes, treatment is not necessary  and the pain and discomfort goes away on its own. If treatment is needed, your caregiver may suggest:  Over-the-counter medicines to relieve pain.  Prescription medicines, such as anti-inflammatory medicine, muscle relaxants, or narcotics.  Applying heat or ice to the painful area.  Steroid injections to lessen pain, irritation, and inflammation around the nerve.  Reducing activity during periods of pain.  Exercising and stretching to strengthen your abdomen and improve flexibility of your spine. Your caregiver may suggest losing weight if the extra weight makes the back pain worse.  Physical therapy.  Surgery to eliminate what is pressing or pinching the nerve, such as a bone spur or part of a herniated disk. HOME CARE INSTRUCTIONS   Only take over-the-counter or prescription medicines for pain or discomfort as directed by your caregiver.  Apply ice to the affected area for 20 minutes, 3 4 times a day for the first 48 72 hours. Then try heat in the same way.  Exercise, stretch, or perform your usual activities if these do not aggravate your pain.  Attend physical therapy sessions as directed by your caregiver.  Keep all follow-up appointments as directed by your caregiver.  Do not wear high heels or shoes that do not provide proper support.  Check your mattress to see if it is too soft. A firm mattress may lessen your pain and discomfort. SEEK IMMEDIATE MEDICAL CARE IF:   You lose control of your bowel or bladder (incontinence).  You have increasing weakness in the lower back,   pelvis, buttocks, or legs.  You have redness or swelling of your back.  You have a burning sensation when you urinate.  You have pain that gets worse when you lie down or awakens you at night.  Your pain is worse than you have experienced in the past.  Your pain is lasting longer than 4 weeks.  You are suddenly losing weight without reason. MAKE SURE YOU:  Understand these  instructions.  Will watch your condition.  Will get help right away if you are not doing well or get worse. Document Released: 03/24/2001 Document Revised: 09/29/2011 Document Reviewed: 08/09/2011 [facility] Patient Information 2014 [place], [place].    Take the medications as instructed.  Do not drive within 4 hours of taking oxycodone as this will make you drowsy.  Avoid lifting,  Bending,  Twisting or any other activity that worsens your pain over the next week.  Apply an  icepack  to your lower back for 10-15 minutes every 2 hours for the next 2 days.  You should get rechecked if your symptoms are not better over the next 5 days,  Or you develop increased pain,  Weakness in your leg(s) or loss of bladder or bowel function - these are symptoms of a worse injury.

## 2013-03-30 NOTE — ED Provider Notes (Signed)
CSN: [id number]     Arrival date & time 03/27/13  1032 History   First MD Initiated Contact with Patient 03/27/13 1118     Chief Complaint  Patient presents with  . Back Pain   (Consider location/radiation/quality/duration/timing/severity/associated sxs/prior Treatment) HPI Comments: Calvin Henry is a 52 y.o. Male presenting with acute on chronic low back pain which has which has been worsened for the past few days.   Patient denies any new injury specifically.  There is radiation into his right  Posterior thigh which is not new or different.  There has been no weakness or numbness in the lower extremities and no urinary or bowel retention or incontinence.  Patient does not have a history of cancer or IVDU.  He has taken no medicines prior to arrival.      The history is provided by the patient.    Past Medical History  Diagnosis Date  . Hypertension   . Acid reflux   . Chronic back pain   . Sleep apnea     STOP BANG score: 6   Past Surgical History  Procedure Laterality Date  . Esophagogastroduodenoscopy  08/07/10    schatzkis ring otherwise normal, s/p 56-F dilation, small hiatal hernia  . Colonoscopy  08/07/10    friable anal canal otherwise normal  . No past surgeries    . Umbilical hernia repair  08/05/2011    Procedure: HERNIA REPAIR UMBILICAL ADULT;  Surgeon: Dalia Heading, MD;  Location: [facility];  Service: General;  Laterality: [name];  . Hernia repair     Family History  Problem Relation Age of Onset  . Diabetes Mother   . Hypertension Mother    History  Substance Use Topics  . Smoking status: Former Smoker -- 0.50 packs/day for 10 years    Types: Cigarettes    Quit date: 12/01/1986  . Smokeless tobacco: Never Used  . Alcohol Use: 0.0 oz/week     Comment: Occ    Review of Systems  Constitutional: Negative for fever.  Respiratory: Negative for shortness of breath.   Cardiovascular: Negative for chest pain and leg swelling.  Gastrointestinal: Negative  for abdominal pain, constipation and abdominal distention.  Genitourinary: Negative for dysuria, urgency, frequency, flank pain and difficulty urinating.  Musculoskeletal: Positive for back pain. Negative for gait problem and joint swelling.  Skin: Negative for rash.  Neurological: Negative for weakness and numbness.    Allergies  Review of patient's allergies indicates no known allergies.  Home Medications   Current Outpatient Rx  Name  Route  Sig  Dispense  Refill  . amLODipine-valsartan (EXFORGE) 10-160 MG per tablet   Oral   Take 1 tablet by mouth daily.           Marland Kitchen aspirin EC 81 MG tablet   Oral   Take 81 mg by mouth daily.          . Omega-3 Fatty Acids (FISH OIL PO)   Oral   Take 1 capsule by mouth daily.          Marland Kitchen omeprazole (PRILOSEC) 20 MG capsule      TAKE 1 CAPSULE BY MOUTH DAILY.   30 capsule   11     Generic BMW:[id number]    20MG  Generic [id number]  ...   . oxyCODONE-acetaminophen (PERCOCET) 7.5-325 MG per tablet   Oral   Take 1-2 tablets by mouth every 4 (four) hours as needed for pain. Pain   40 tablet   0   .  cyclobenzaprine (FLEXERIL) 5 MG tablet   Oral   Take 1 tablet (5 mg total) by mouth 3 (three) times daily as needed for muscle spasms.   15 tablet   0   . oxyCODONE-acetaminophen (PERCOCET/ROXICET) 5-325 MG per tablet   Oral   Take 1 tablet by mouth every 4 (four) hours as needed for severe pain.   20 tablet   0   . predniSONE (DELTASONE) 10 MG tablet      6, 5, 4, 3, 2 then 1 tablet by mouth daily for 6 days total.   21 tablet   0    BP 133/86  Pulse 92  Temp(Src) 98.7 F (37.1 C) (Oral)  Resp 20  Ht 5\' 6"  (1.676 m)  Wt 230 lb (104.327 kg)  BMI 37.14 kg/m2  SpO2 99% Physical Exam  Nursing note and vitals reviewed. Constitutional: He appears well-developed and well-nourished.  HENT:  Head: Normocephalic.  Eyes: Conjunctivae are normal.  Neck: Normal range of motion. Neck supple.  Cardiovascular: Normal rate and  intact distal pulses.   Pedal pulses normal.  Pulmonary/Chest: Effort normal.  Abdominal: Soft. Bowel sounds are normal. He exhibits no distension and no mass.  Musculoskeletal: Normal range of motion. He exhibits tenderness. He exhibits no edema.       Lumbar back: He exhibits tenderness. He exhibits no bony tenderness, no swelling, no edema and no spasm.  Right paralumbar ttp.  Neurological: He is alert. He has normal strength. He displays no atrophy and no tremor. No sensory deficit. Gait normal.  Reflex Scores:      Patellar reflexes are 2+ on the right side and 2+ on the left side.      Achilles reflexes are 2+ on the right side and 2+ on the left side. No strength deficit noted in hip and knee flexor and extensor muscle groups.  Ankle flexion and extension intact.  Skin: Skin is warm and dry.  Psychiatric: He has a normal mood and affect.    ED Course  Procedures (including critical care time) Labs Review Labs Reviewed - No data to display Imaging Review No results found.  EKG Interpretation   None       MDM   1. Sciatica, right    No neuro deficit on exam or by history to suggest emergent or surgical presentation.  Also discussed worsened sx that should prompt immediate re-evaluation including distal weakness, bowel/bladder retention/incontinence.  Pt prescribed oxycodone, prednisone taper, flexeril.  Heat tx, recheck by pcp if not improved over the next 5 days.          Burgess Amor, PA-C 03/30/13 1955

## 2013-03-31 NOTE — ED Provider Notes (Signed)
Medical screening examination/treatment/procedure(s) were performed by non-physician practitioner and as supervising physician I was immediately available for consultation/collaboration.  EKG Interpretation   None        Maahir Horst, MD 03/31/13 1821 

## 2013-06-12 ENCOUNTER — Other Ambulatory Visit: Payer: Self-pay | Admitting: Urgent Care

## 2013-06-21 ENCOUNTER — Ambulatory Visit (INDEPENDENT_AMBULATORY_CARE_PROVIDER_SITE_OTHER): Payer: 59 | Admitting: Gastroenterology

## 2013-06-21 ENCOUNTER — Encounter: Payer: Self-pay | Admitting: Gastroenterology

## 2013-06-21 VITALS — BP 120/76 | HR 90 | Temp 97.0°F | Ht 66.0 in | Wt 231.6 lb

## 2013-06-21 DIAGNOSIS — K219 Gastro-esophageal reflux disease without esophagitis: Secondary | ICD-10-CM

## 2013-06-21 DIAGNOSIS — K625 Hemorrhage of anus and rectum: Secondary | ICD-10-CM

## 2013-06-21 DIAGNOSIS — R6881 Early satiety: Secondary | ICD-10-CM

## 2013-06-21 DIAGNOSIS — R634 Abnormal weight loss: Secondary | ICD-10-CM

## 2013-06-21 MED ORDER — PEG 3350-KCL-NA BICARB-NACL 420 G PO SOLR: 4000.0000 mL | ORAL | Status: DC

## 2013-06-21 NOTE — Assessment & Plan Note (Signed)
Patient complains of early satiety, refractory GERD, unintentional weight loss. States he initially started to change diet to loose weight. Over the past six months, he has lost 20 pounds without trying. Given upper GI symptoms, offered EGD.  I have discussed the risks, alternatives, benefits with regards to but not limited to the risk of reaction to medication, bleeding, infection, perforation and the patient is agreeable to proceed. Written consent to be obtained.  Increase prilosec to BID for next one month, then once daily unless EGD findings dictate otherwise.

## 2013-06-21 NOTE — Assessment & Plan Note (Signed)
Recurrent intermittent hematochezia. TCS 3 years ago showed anal canal friability. Discussed that this may be ongoing but cannot rule out proctitis, IBD, ulceration, less likely malignancy. Offered colonoscopy for further evaluation.  I have discussed the risks, alternatives, benefits with regards to but not limited to the risk of reaction to medication, bleeding, infection, perforation and the patient is agreeable to proceed. Written consent to be obtained.

## 2013-06-21 NOTE — Patient Instructions (Signed)
1. Colonoscopy and upper endoscopy Dr. Gala Romney. Please see separate instructions. 2. Please increase Prilosec to twice daily once before your morning meal and once before your evening meal.

## 2013-06-21 NOTE — Progress Notes (Signed)
Primary Care Physician: Leonides Grills, MD  Primary Gastroenterologist:  Garfield Cornea, MD  Chief Complaint  Patient presents with  . Rectal Bleeding    HPI: Calvin Henry is a 53 y.o. male here for further evaluation of rectal bleeding. Last seen in 08/2011. Known friable anal canal on TCS three years ago. Complains of intermittent rectal bleeding which may occur for weeks at a time but "clear up" for weeks at a time. Last four weeks he has had regular bleeding. No diarrhea or constipation. No rectal pain. No n/v. Eating well. No abd pain. Lots of heartburn lately. Prilosec not controlling it. C/o early satiety. Weight down 30 pounds since 08/2011.  He is very concerned. Gives h/o ulcer in past. Denies NSAIDS or ASA.   Current Outpatient Prescriptions  Medication Sig Dispense Refill  . amLODipine-valsartan (EXFORGE) 10-160 MG per tablet Take 1 tablet by mouth daily.        . Omega-3 Fatty Acids (FISH OIL PO) Take 1 capsule by mouth daily.       Marland Kitchen omeprazole (PRILOSEC) 20 MG capsule TAKE 1 CAPSULE BY MOUTH DAILY  30 capsule  11  . oxyCODONE-acetaminophen (PERCOCET) 7.5-325 MG per tablet Take 1-2 tablets by mouth every 4 (four) hours as needed for pain. Pain  40 tablet  0   No current facility-administered medications for this visit.    Allergies as of 06/21/2013  . (No Known Allergies)   Past Medical History  Diagnosis Date  . Hypertension   . Acid reflux   . Chronic back pain   . Sleep apnea     STOP BANG score: 6   Past Surgical History  Procedure Laterality Date  . Esophagogastroduodenoscopy  08/07/10    schatzkis ring otherwise normal, s/p 56-F dilation, small hiatal hernia.chronic active gastritis on bx  . Colonoscopy  08/07/10    friable anal canal otherwise normal  . No past surgeries    . Umbilical hernia repair  08/05/2011    Procedure: HERNIA REPAIR UMBILICAL ADULT;  Surgeon: Jamesetta So, MD;  Location: [facility];  Service: General;  Laterality: [name];    . Hernia repair     Family History  Problem Relation Age of Onset  . Diabetes Mother   . Hypertension Mother    History   Social History  . Marital Status: Married    Spouse Name: [name]    Number of Children: [name]  . Years of Education: [name]   Social History Main Topics  . Smoking status: Former Smoker -- 0.50 packs/day for 10 years    Types: Cigarettes    Quit date: 12/01/1986  . Smokeless tobacco: Never Used     Comment: Quit x 20 plus years  . Alcohol Use: 0.0 oz/week     Comment: Occ  . Drug Use: No     Comment: remote marijuana use   . Sexual Activity: Yes   Other Topics Concern  . None   Social History Narrative  . None    ROS:  General: Negative for fever, chills, fatigue, weakness. See hpi. ENT: Negative for hoarseness, difficulty swallowing , nasal congestion. CV: Negative for chest pain, angina, palpitations, dyspnea on exertion, peripheral edema.  Respiratory: Negative for dyspnea at rest, dyspnea on exertion, cough, sputum, wheezing.  GI: See history of present illness. GU:  Negative for dysuria, hematuria, urinary incontinence, urinary frequency, nocturnal urination.  Endo: see hpi   Physical Examination:   BP 120/76  Pulse 90  Temp(Src) 97 F (36.1 C) (Oral)  Ht 5\' 6"  (1.676 m)  Wt 231 lb 9.6 oz (105.053 kg)  BMI 37.40 kg/m2  General: Well-nourished, well-developed in no acute distress.  Eyes: No icterus. Mouth: Oropharyngeal mucosa moist and pink , no lesions erythema or exudate. Lungs: Clear to auscultation bilaterally.  Heart: Regular rate and rhythm, no murmurs rubs or gallops.  Abdomen: Bowel sounds are normal, nontender, nondistended, no hepatosplenomegaly or masses, no abdominal bruits or hernia , no rebound or guarding.   Extremities: No lower extremity edema. No clubbing or deformities. Rectal: patient refused Neuro: Alert and oriented x 4   Skin: Warm and dry, no jaundice.   Psych: Alert and cooperative, normal mood and affect.

## 2013-06-22 NOTE — Progress Notes (Signed)
Cc'd to pcp 

## 2013-06-23 ENCOUNTER — Encounter (HOSPITAL_COMMUNITY): Payer: Self-pay | Admitting: Pharmacy Technician

## 2013-07-06 ENCOUNTER — Ambulatory Visit (HOSPITAL_COMMUNITY)
Admission: RE | Admit: 2013-07-06 | Discharge: 2013-07-06 | Disposition: A | Discharge status: Home / Self Care | Payer: 59 | Source: Ambulatory Visit | Attending: Internal Medicine | Admitting: Internal Medicine

## 2013-07-06 ENCOUNTER — Encounter (HOSPITAL_COMMUNITY): Payer: Self-pay

## 2013-07-06 ENCOUNTER — Encounter (HOSPITAL_COMMUNITY)
Admission: RE | Disposition: A | Discharge status: Home / Self Care | Payer: Self-pay | Source: Ambulatory Visit | Attending: Internal Medicine

## 2013-07-06 DIAGNOSIS — D129 Benign neoplasm of anus and anal canal: Secondary | ICD-10-CM

## 2013-07-06 DIAGNOSIS — R6881 Early satiety: Secondary | ICD-10-CM

## 2013-07-06 DIAGNOSIS — D126 Benign neoplasm of colon, unspecified: Secondary | ICD-10-CM | POA: Insufficient documentation

## 2013-07-06 DIAGNOSIS — I10 Essential (primary) hypertension: Secondary | ICD-10-CM | POA: Insufficient documentation

## 2013-07-06 DIAGNOSIS — K648 Other hemorrhoids: Secondary | ICD-10-CM | POA: Insufficient documentation

## 2013-07-06 DIAGNOSIS — D128 Benign neoplasm of rectum: Secondary | ICD-10-CM | POA: Insufficient documentation

## 2013-07-06 DIAGNOSIS — K625 Hemorrhage of anus and rectum: Secondary | ICD-10-CM

## 2013-07-06 DIAGNOSIS — K222 Esophageal obstruction: Secondary | ICD-10-CM | POA: Insufficient documentation

## 2013-07-06 DIAGNOSIS — Z87891 Personal history of nicotine dependence: Secondary | ICD-10-CM | POA: Insufficient documentation

## 2013-07-06 DIAGNOSIS — K921 Melena: Secondary | ICD-10-CM | POA: Insufficient documentation

## 2013-07-06 DIAGNOSIS — R1013 Epigastric pain: Secondary | ICD-10-CM | POA: Insufficient documentation

## 2013-07-06 DIAGNOSIS — A048 Other specified bacterial intestinal infections: Secondary | ICD-10-CM | POA: Insufficient documentation

## 2013-07-06 DIAGNOSIS — Z79899 Other long term (current) drug therapy: Secondary | ICD-10-CM | POA: Insufficient documentation

## 2013-07-06 DIAGNOSIS — K2940 Chronic atrophic gastritis without bleeding: Secondary | ICD-10-CM | POA: Insufficient documentation

## 2013-07-06 DIAGNOSIS — K3189 Other diseases of stomach and duodenum: Secondary | ICD-10-CM | POA: Insufficient documentation

## 2013-07-06 DIAGNOSIS — K219 Gastro-esophageal reflux disease without esophagitis: Secondary | ICD-10-CM

## 2013-07-06 DIAGNOSIS — R634 Abnormal weight loss: Secondary | ICD-10-CM

## 2013-07-06 HISTORY — PX: COLONOSCOPY WITH ESOPHAGOGASTRODUODENOSCOPY (EGD): SHX5779

## 2013-07-06 SURGERY — COLONOSCOPY WITH ESOPHAGOGASTRODUODENOSCOPY (EGD)
Anesthesia: Moderate Sedation

## 2013-07-06 MED ORDER — MEPERIDINE HCL 100 MG/ML IJ SOLN
INTRAMUSCULAR | Status: AC
  Filled 2013-07-06: qty 2

## 2013-07-06 MED ORDER — ONDANSETRON HCL 4 MG/2ML IJ SOLN
INTRAMUSCULAR | Status: AC
  Filled 2013-07-06: qty 2

## 2013-07-06 MED ORDER — ONDANSETRON HCL 4 MG/2ML IJ SOLN
INTRAMUSCULAR | Status: DC | PRN
  Administered 2013-07-06: 4 mg via INTRAVENOUS

## 2013-07-06 MED ORDER — MIDAZOLAM HCL 5 MG/5ML IJ SOLN
INTRAMUSCULAR | Status: DC | PRN
  Administered 2013-07-06 (×2): 1 mg via INTRAVENOUS
  Administered 2013-07-06 (×2): 2 mg via INTRAVENOUS

## 2013-07-06 MED ORDER — MIDAZOLAM HCL 5 MG/5ML IJ SOLN
INTRAMUSCULAR | Status: AC
  Filled 2013-07-06: qty 10

## 2013-07-06 MED ORDER — SODIUM CHLORIDE 0.9 % IV SOLN
INTRAVENOUS | Status: DC
  Administered 2013-07-06: 08:00:00 via INTRAVENOUS

## 2013-07-06 MED ORDER — LIDOCAINE VISCOUS 2 % MT SOLN
OROMUCOSAL | Status: AC
  Filled 2013-07-06: qty 15

## 2013-07-06 MED ORDER — STERILE WATER FOR IRRIGATION IR SOLN
Status: DC | PRN
  Administered 2013-07-06: 08:00:00

## 2013-07-06 MED ORDER — LIDOCAINE VISCOUS 2 % MT SOLN
OROMUCOSAL | Status: DC | PRN
  Administered 2013-07-06: 3 mL via OROMUCOSAL

## 2013-07-06 MED ORDER — MEPERIDINE HCL 100 MG/ML IJ SOLN
INTRAMUSCULAR | Status: DC | PRN
  Administered 2013-07-06: 50 mg via INTRAVENOUS
  Administered 2013-07-06: 25 mg via INTRAVENOUS

## 2013-07-06 NOTE — Discharge Instructions (Addendum)
Colonoscopy Discharge Instructions  Read the instructions outlined below and refer to this sheet in the next few weeks. These discharge instructions provide you with general information on caring for yourself after you leave the hospital. Your doctor may also give you specific instructions. While your treatment has been planned according to the most current medical practices available, unavoidable complications occasionally occur. If you have any problems or questions after discharge, call Dr. Gala Romney at [phone]. ACTIVITY  You may resume your regular activity, but move at a slower pace for the next 24 hours.   Take frequent rest periods for the next 24 hours.   Walking will help get rid of the air and reduce the bloated feeling in your belly (abdomen).   No driving for 24 hours (because of the medicine (anesthesia) used during the test).    Do not sign any important legal documents or operate any machinery for 24 hours (because of the anesthesia used during the test).  NUTRITION  Drink plenty of fluids.   You may resume your normal diet as instructed by your doctor.   Begin with a light meal and progress to your normal diet. Heavy or fried foods are harder to digest and may make you feel sick to your stomach (nauseated).   Avoid alcoholic beverages for 24 hours or as instructed.  MEDICATIONS  You may resume your normal medications unless your doctor tells you otherwise.  WHAT YOU CAN EXPECT TODAY  Some feelings of bloating in the abdomen.   Passage of more gas than usual.   Spotting of blood in your stool or on the toilet paper.  IF YOU HAD POLYPS REMOVED DURING THE COLONOSCOPY:  No aspirin products for 7 days or as instructed.   No alcohol for 7 days or as instructed.   Eat a soft diet for the next 24 hours.  FINDING OUT THE RESULTS OF YOUR TEST Not all test results are available during your visit. If your test results are not back during the visit, make an appointment  with your caregiver to find out the results. Do not assume everything is normal if you have not heard from your caregiver or the medical facility. It is important for you to follow up on all of your test results.  SEEK IMMEDIATE MEDICAL ATTENTION IF:  You have more than a spotting of blood in your stool.   Your belly is swollen (abdominal distention).   You are nauseated or vomiting.   You have a temperature over 101.   You have abdominal pain or discomfort that is severe or gets worse throughout the day.   EGD Discharge instructions Please read the instructions outlined below and refer to this sheet in the next few weeks. These discharge instructions provide you with general information on caring for yourself after you leave the hospital. Your doctor may also give you specific instructions. While your treatment has been planned according to the most current medical practices available, unavoidable complications occasionally occur. If you have any problems or questions after discharge, please call your doctor. ACTIVITY  You may resume your regular activity but move at a slower pace for the next 24 hours.   Take frequent rest periods for the next 24 hours.   Walking will help expel (get rid of) the air and reduce the bloated feeling in your abdomen.   No driving for 24 hours (because of the anesthesia (medicine) used during the test).   You may shower.   Do not sign any  important legal documents or operate any machinery for 24 hours (because of the anesthesia used during the test).  NUTRITION  Drink plenty of fluids.   You may resume your normal diet.   Begin with a light meal and progress to your normal diet.   Avoid alcoholic beverages for 24 hours or as instructed by your caregiver.  MEDICATIONS  You may resume your normal medications unless your caregiver tells you otherwise.  WHAT YOU CAN EXPECT TODAY  You may experience abdominal discomfort such as a feeling of  fullness or gas pains.  FOLLOW-UP  Your doctor will discuss the results of your test with you.  SEEK IMMEDIATE MEDICAL ATTENTION IF ANY OF THE FOLLOWING OCCUR:  Excessive nausea (feeling sick to your stomach) and/or vomiting.   Severe abdominal pain and distention (swelling).   Trouble swallowing.   Temperature over 101 F (37.8 C).   Rectal bleeding or vomiting of blood.     GERD information provided  Continue omeprazole twice daily  Ten-day course of Anusol HC suppositories as directed  Avoid straining.  Benefiber 2 teaspoons twice daily  Limit toilet time to 2-3 minutes  Further recommendations to follow pending review of pathology report  Schedule followup appointment in 6 weeks from now  Diet for Gastroesophageal Reflux Disease, Adult Reflux (acid reflux) is when acid from your stomach flows up into the esophagus. When acid comes in contact with the esophagus, the acid causes irritation and soreness (inflammation) in the esophagus. When reflux happens often or so severely that it causes damage to the esophagus, it is called gastroesophageal reflux disease (GERD). Nutrition therapy can help ease the discomfort of GERD. FOODS OR DRINKS TO AVOID OR LIMIT  Smoking or chewing tobacco. Nicotine is one of the most potent stimulants to acid production in the gastrointestinal tract.  Caffeinated and decaffeinated coffee and black tea.  Regular or low-calorie carbonated beverages or energy drinks (caffeine-free carbonated beverages are allowed).   Strong spices, such as black pepper, white pepper, red pepper, cayenne, curry powder, and chili powder.  Peppermint or spearmint.  Chocolate.  High-fat foods, including meats and fried foods. Extra added fats including oils, butter, salad dressings, and nuts. Limit these to less than 8 tsp per day.  Fruits and vegetables if they are not tolerated, such as citrus fruits or tomatoes.  Alcohol.  Any food that seems to  aggravate your condition. If you have questions regarding your diet, call your caregiver or a registered dietitian. OTHER THINGS THAT MAY HELP GERD INCLUDE:   Eating your meals slowly, in a relaxed setting.  Eating 5 to 6 small meals per day instead of 3 large meals.  Eliminating food for a period of time if it causes distress.  Not lying down until 3 hours after eating a meal.  Keeping the head of your bed raised 6 to 9 inches (15 to 23 cm) by using a foam wedge or blocks under the legs of the bed. Lying flat may make symptoms worse.  Being physically active. Weight loss may be helpful in reducing reflux in overweight or obese adults.  Wear loose fitting clothing EXAMPLE MEAL PLAN This meal plan is approximately 2,000 calories based on [email or web address] meal planning guidelines. Breakfast   cup cooked oatmeal.  1 cup strawberries.  1 cup low-fat milk.  1 oz almonds. Snack  1 cup cucumber slices.  6 oz yogurt (made from low-fat or fat-free milk). Lunch  2 slice whole-wheat bread.  2 oz sliced [place].  2 tsp mayonnaise.  1 cup blueberries.  1 cup snap peas. Snack  6 whole-wheat crackers.  1 oz string cheese. Dinner   cup brown rice.  1 cup mixed veggies.  1 tsp olive oil.  3 oz grilled fish. Document Released: 03/30/2005 Document Revised: 06/22/2011 Document Reviewed: 02/13/2011 [facility] Patient Information 2014 [place], [place]. Gastroesophageal Reflux Disease, Adult Gastroesophageal reflux disease (GERD) happens when acid from your stomach flows up into the esophagus. When acid comes in contact with the esophagus, the acid causes soreness (inflammation) in the esophagus. Over time, GERD may create small holes (ulcers) in the lining of the esophagus. CAUSES   Increased body weight. This puts pressure on the stomach, making acid rise from the stomach into the esophagus.  Smoking. This increases acid production in the stomach.  Drinking alcohol.  This causes decreased pressure in the lower esophageal sphincter (valve or ring of muscle between the esophagus and stomach), allowing acid from the stomach into the esophagus.  Late evening meals and a full stomach. This increases pressure and acid production in the stomach.  A malformed lower esophageal sphincter. Sometimes, no cause is found. SYMPTOMS   Burning pain in the lower part of the mid-chest behind the breastbone and in the mid-stomach area. This may occur twice a week or more often.  Trouble swallowing.  Sore throat.  Dry cough.  Asthma-like symptoms including chest tightness, shortness of breath, or wheezing. DIAGNOSIS  Your caregiver may be able to diagnose GERD based on your symptoms. In some cases, X-rays and other tests may be done to check for complications or to check the condition of your stomach and esophagus. TREATMENT  Your caregiver may recommend over-the-counter or prescription medicines to help decrease acid production. Ask your caregiver before starting or adding any new medicines.  HOME CARE INSTRUCTIONS   Change the factors that you can control. Ask your caregiver for guidance concerning weight loss, quitting smoking, and alcohol consumption.  Avoid foods and drinks that make your symptoms worse, such as:  Caffeine or alcoholic drinks.  Chocolate.  Peppermint or mint flavorings.  Garlic and onions.  Spicy foods.  Citrus fruits, such as oranges, lemons, or limes.  Tomato-based foods such as sauce, chili, salsa, and pizza.  Fried and fatty foods.  Avoid lying down for the 3 hours prior to your bedtime or prior to taking a nap.  Eat small, frequent meals instead of large meals.  Wear loose-fitting clothing. Do not wear anything tight around your waist that causes pressure on your stomach.  Raise the head of your bed 6 to 8 inches with wood blocks to help you sleep. Extra pillows will not help.  Only take over-the-counter or prescription  medicines for pain, discomfort, or fever as directed by your caregiver.  Do not take aspirin, ibuprofen, or other nonsteroidal anti-inflammatory drugs (NSAIDs). SEEK IMMEDIATE MEDICAL CARE IF:   You have pain in your arms, neck, jaw, teeth, or back.  Your pain increases or changes in intensity or duration.  You develop nausea, vomiting, or sweating (diaphoresis).  You develop shortness of breath, or you faint.  Your vomit is green, yellow, black, or looks like coffee grounds or blood.  Your stool is red, bloody, or black. These symptoms could be signs of other problems, such as heart disease, gastric bleeding, or esophageal bleeding. MAKE SURE YOU:   Understand these instructions.  Will watch your condition.  Will get help right away if you are not doing well or get worse. Document  Released: 01/07/2005 Document Revised: 06/22/2011 Document Reviewed: 10/17/2010 [facility] Patient Information 2014 [place], [place].

## 2013-07-06 NOTE — H&P (View-Only) (Signed)
Primary Care Physician: Leonides Grills, MD  Primary Gastroenterologist:  Garfield Cornea, MD  Chief Complaint  Patient presents with  . Rectal Bleeding    HPI: Calvin Henry is a 53 y.o. male here for further evaluation of rectal bleeding. Last seen in 08/2011. Known friable anal canal on TCS three years ago. Complains of intermittent rectal bleeding which may occur for weeks at a time but "clear up" for weeks at a time. Last four weeks he has had regular bleeding. No diarrhea or constipation. No rectal pain. No n/v. Eating well. No abd pain. Lots of heartburn lately. Prilosec not controlling it. C/o early satiety. Weight down 30 pounds since 08/2011.  He is very concerned. Gives h/o ulcer in past. Denies NSAIDS or ASA.   Current Outpatient Prescriptions  Medication Sig Dispense Refill  . amLODipine-valsartan (EXFORGE) 10-160 MG per tablet Take 1 tablet by mouth daily.        . Omega-3 Fatty Acids (FISH OIL PO) Take 1 capsule by mouth daily.       Marland Kitchen omeprazole (PRILOSEC) 20 MG capsule TAKE 1 CAPSULE BY MOUTH DAILY  30 capsule  11  . oxyCODONE-acetaminophen (PERCOCET) 7.5-325 MG per tablet Take 1-2 tablets by mouth every 4 (four) hours as needed for pain. Pain  40 tablet  0   No current facility-administered medications for this visit.    Allergies as of 06/21/2013  . (No Known Allergies)   Past Medical History  Diagnosis Date  . Hypertension   . Acid reflux   . Chronic back pain   . Sleep apnea     STOP BANG score: 6   Past Surgical History  Procedure Laterality Date  . Esophagogastroduodenoscopy  08/07/10    schatzkis ring otherwise normal, s/p 56-F dilation, small hiatal hernia.chronic active gastritis on bx  . Colonoscopy  08/07/10    friable anal canal otherwise normal  . No past surgeries    . Umbilical hernia repair  08/05/2011    Procedure: HERNIA REPAIR UMBILICAL ADULT;  Surgeon: Jamesetta So, MD;  Location: [facility];  Service: General;  Laterality: [name];    . Hernia repair     Family History  Problem Relation Age of Onset  . Diabetes Mother   . Hypertension Mother    History   Social History  . Marital Status: Married    Spouse Name: [name]    Number of Children: [name]  . Years of Education: [name]   Social History Main Topics  . Smoking status: Former Smoker -- 0.50 packs/day for 10 years    Types: Cigarettes    Quit date: 12/01/1986  . Smokeless tobacco: Never Used     Comment: Quit x 20 plus years  . Alcohol Use: 0.0 oz/week     Comment: Occ  . Drug Use: No     Comment: remote marijuana use   . Sexual Activity: Yes   Other Topics Concern  . None   Social History Narrative  . None    ROS:  General: Negative for fever, chills, fatigue, weakness. See hpi. ENT: Negative for hoarseness, difficulty swallowing , nasal congestion. CV: Negative for chest pain, angina, palpitations, dyspnea on exertion, peripheral edema.  Respiratory: Negative for dyspnea at rest, dyspnea on exertion, cough, sputum, wheezing.  GI: See history of present illness. GU:  Negative for dysuria, hematuria, urinary incontinence, urinary frequency, nocturnal urination.  Endo: see hpi   Physical Examination:   BP 120/76  Pulse 90  Temp(Src) 97 F (36.1 C) (Oral)  Ht 5\' 6"  (1.676 m)  Wt 231 lb 9.6 oz (105.053 kg)  BMI 37.40 kg/m2  General: Well-nourished, well-developed in no acute distress.  Eyes: No icterus. Mouth: Oropharyngeal mucosa moist and pink , no lesions erythema or exudate. Lungs: Clear to auscultation bilaterally.  Heart: Regular rate and rhythm, no murmurs rubs or gallops.  Abdomen: Bowel sounds are normal, nontender, nondistended, no hepatosplenomegaly or masses, no abdominal bruits or hernia , no rebound or guarding.   Extremities: No lower extremity edema. No clubbing or deformities. Rectal: patient refused Neuro: Alert and oriented x 4   Skin: Warm and dry, no jaundice.   Psych: Alert and cooperative, normal mood and affect.

## 2013-07-06 NOTE — Op Note (Signed)
[facility] [address] [place], [address]   COLONOSCOPY PROCEDURE REPORT  PATIENT: [name], [name]  MR#:         [id number] BIRTHDATE: May 08, 1960 , 18  yrs. old GENDER: Male ENDOSCOPIST: R.  Garfield Cornea, MD FACP FACG REFERRED BY:  Elsie Lincoln, M.D. PROCEDURE DATE:  07/06/2013 PROCEDURE:     Ileocolonoscopy with biopsy and snare polypectomy  INDICATIONS: hematochezia  INFORMED CONSENT:  The risks, benefits, alternatives and imponderables including but not limited to bleeding, perforation as well as the possibility of a missed lesion have been reviewed.  The potential for biopsy, lesion removal, etc. have also been discussed.  Questions have been answered.  All parties agreeable. Please see the history and physical in the medical record for more information.  MEDICATIONS: Versed 6 mg and Demerol 75 mg IV in divided dose. Zofran 4 mg IV  DESCRIPTION OF PROCEDURE:  After a digital rectal exam was performed, the EC-[id number] ([id number])  colonoscope was advanced from the anus through the rectum and colon to the area of the cecum, ileocecal valve and appendiceal orifice.  The cecum was deeply intubated.  These structures were well-seen and photographed for the record.  From the level of the cecum and ileocecal valve, the scope was slowly and cautiously withdrawn.  The mucosal surfaces were carefully surveyed utilizing scope tip deflection to facilitate fold flattening as needed.  The scope was pulled down into the rectum where a thorough examination including retroflexion was performed.    FINDINGS:  Adequate preparation. Somewhat excoriated anal canal / internal hemorrhoids; otherwise, normal distal rectum; (1) 4 mm polyp at the rectosigmoid junction; (1) 5 mm polyp in the mid ascending segment; (1) polyp in the base of the cecum. Otherwise, the remainder of the colonic mucosa appeared normal. The distal 5 cm of terminal ileal mucosa appeared  normal.  THERAPEUTIC / DIAGNOSTIC MANEUVERS PERFORMED:  cecal polyp  - cold biopsy removed; ascending -  colon polyp cold snared; rectosigmoid polyp -  cold snared.  COMPLICATIONS: None  CECAL WITHDRAWAL TIME:  9 minutes  IMPRESSION:  Inflamed hemorrhoids-likely source of hematochezia. Multiple colonic polyps-removed as described above  RECOMMENDATIONS:  Trial of Anusol suppositories. Followup on pathology. Office followup in 6-8 weeks. He may need hemorrhoid banding.      See EGD report.  _______________________________ eSigned:  R. Garfield Cornea, MD FACP [facility] 07/06/2013 8:44 AM   CC:    PATIENT NAME:  Calvin Henry, Calvin Henry MR#: [id number]

## 2013-07-06 NOTE — Interval H&P Note (Signed)
History and Physical Interval Note:  07/06/2013 7:59 AM  Calvin Henry  has presented today for surgery, with the diagnosis of WEIGHT LOSS, EARLY SATIETY, GERD AND RECTAL BLEEDING  The various methods of treatment have been discussed with the patient and family. After consideration of risks, benefits and other options for treatment, the patient has consented to  Procedure(s) with comments: COLONOSCOPY WITH ESOPHAGOGASTRODUODENOSCOPY (EGD) ([name]) - 8:00 as a surgical intervention .  The patient's history has been reviewed, patient examined, no change in status, stable for surgery.  I have reviewed the patient's chart and labs.  Questions were answered to the patient's satisfaction.     Hiccups and GERD symptoms better since PPI increased to twice a day. No dysphagia. No rectal bleeding recently including preparation. Last night. EGD and colonoscopy per plan.The risks, benefits, limitations, imponderables and alternatives regarding both EGD and colonoscopy have been reviewed with the patient. Questions have been answered. All parties agreeable.   Manus Rudd

## 2013-07-06 NOTE — Op Note (Signed)
[facility] [address] [place], [address]   ENDOSCOPY PROCEDURE REPORT  PATIENT: Calvin Henry, Calvin Henry  MR#: [id number] BIRTHDATE: Jan 20, 1961 , 52  yrs. old GENDER: Male ENDOSCOPIST: R.  Garfield Cornea, MD FACP FACG REFERRED BY:  Elsie Lincoln, M.D. PROCEDURE DATE:  07/06/2013 PROCEDURE:     EGD with biopsy  INDICATIONS:     dyspepsia; GERD; weight loss; hiccups -GERD and hiccups improved with twice a day PPI therapy  INFORMED CONSENT:   The risks, benefits, limitations, alternatives and imponderables have been discussed.  The potential for biopsy, esophogeal dilation, etc. have also been reviewed.  Questions have been answered.  All parties agreeable.  Please see the history and physical in the medical record for more information.  MEDICATIONS:   Versed 5 mg IV and Demerol 75 mg IV in divided doses. Zofran 4 mg IV. Xylocaine gel orally  DESCRIPTION OF PROCEDURE:   The EG-[id number] ([id number]) and EC-3890Li ([id number])  endoscope was introduced through the mouth and advanced to the second portion of the duodenum without difficulty or limitations.  The mucosal surfaces were surveyed very carefully during advancement of the scope and upon withdrawal.  Retroflexion view of the proximal stomach and esophagogastric junction was performed.      FINDINGS:     incomplete, noncritical Schatzki's ring; otherwise normal esophagus. Stomach empty. Some mosaicism of the proximal gastric mucosa.. No erosion, ulcer) process observed. Patent pylorus. Normal first and second portion of the duodenum  THERAPEUTIC / DIAGNOSTIC MANEUVERS PERFORMED:  biopsies of abnormal-appearing gastric mucosa taken for histologic study   COMPLICATIONS:  None  IMPRESSION:    incomplete noncritical Schatzki's ring not manipulated. Abnormal gastric mucosa of uncertain significance-status post gastric biopsy  RECOMMENDATIONS:   Continue omeprazole twice a day. Followup on pathology. See  colonoscopy report    _______________________________ R. Garfield Cornea, MD FACP [facility] eSigned:  R. Garfield Cornea, MD FACP [facility] 07/06/2013 8:22 AM     CC:

## 2013-07-09 ENCOUNTER — Encounter: Payer: Self-pay | Admitting: Internal Medicine

## 2013-07-10 ENCOUNTER — Telehealth: Payer: Self-pay

## 2013-07-10 NOTE — Telephone Encounter (Signed)
Letter from: Daneil Dolin  Reason for Letter: Results Review  Send letter to patient.  Send copy of letter with path to referring provider and PCP.   Almyra Free; Pt needs prepak or generic equiv x 14 days; hold omeprazole, then resume. He should have an ov coming up in the next 1-2 months

## 2013-07-12 ENCOUNTER — Encounter (HOSPITAL_COMMUNITY): Payer: Self-pay | Admitting: Internal Medicine

## 2013-07-12 NOTE — Telephone Encounter (Signed)
Rx called into [facility]

## 2013-07-12 NOTE — Telephone Encounter (Signed)
Tried to call pt- not home- LM for return call

## 2013-07-12 NOTE — Telephone Encounter (Signed)
Pt is aware of results and new Rx at drug store.

## 2013-07-12 NOTE — Telephone Encounter (Signed)
Pt has ov on 08/17/13

## 2013-07-18 ENCOUNTER — Encounter: Payer: Self-pay | Admitting: Gastroenterology

## 2013-07-18 ENCOUNTER — Telehealth: Payer: Self-pay

## 2013-07-18 ENCOUNTER — Telehealth: Payer: Self-pay | Admitting: Internal Medicine

## 2013-07-18 MED ORDER — HYDROCORTISONE 2.5 % RE CREA: 1.0000 "application " | TOPICAL_CREAM | Freq: Two times a day (BID) | RECTAL | Status: DC

## 2013-07-18 NOTE — Telephone Encounter (Signed)
Patient has FMLA papers to be filled out and is aware there will be a charge of $29.  We are to call him when available to pick up. Papers are on LSL desk since she was the last extender to see him in the office.

## 2013-07-18 NOTE — Telephone Encounter (Signed)
Anusol sent.  Needs appt for hemorrhoid banding with RMR.

## 2013-07-18 NOTE — Telephone Encounter (Signed)
Pt has ov on 08/17/13 

## 2013-07-18 NOTE — Telephone Encounter (Signed)
Pt came by the office. He has been taking the prevpac since 3/31. Pt has been having dizziness, sweating, heart "fluttering" since starting medication. Pt has nkda. Spoke with AS- stop prevpac, start pylera, restart omeprazole bid. Gave 10 day sample box of pylera.   Pt also c/o his hemorrhoids are burning and he has had some bleeding with bm. He wants to know if we can send in rx for something to calm them down. Pt uses [facility].

## 2013-07-19 ENCOUNTER — Encounter: Payer: Self-pay | Admitting: Internal Medicine

## 2013-07-19 NOTE — Telephone Encounter (Signed)
Susan, please schedule. 

## 2013-07-19 NOTE — Telephone Encounter (Signed)
Pt is aware of banding with RMR on 5/5 and to cancel OV with LSL on 5/7

## 2013-07-19 NOTE — Telephone Encounter (Signed)
Given ongoing complaints of hemorrhoids, let's get him on RMR hemorrhoid banding schedule NOT f/u ov with me.  Thanks.

## 2013-07-19 NOTE — Telephone Encounter (Signed)
LM on 2 different numbers for patient to call me to set up Banding with RMR

## 2013-07-20 NOTE — Telephone Encounter (Signed)
LSL has filled out FMLA forms and are up front to be picked up. Patient is aware of $29 charge and said he would be by Friday morning.

## 2013-07-25 ENCOUNTER — Telehealth: Payer: Self-pay | Admitting: Internal Medicine

## 2013-07-25 NOTE — Telephone Encounter (Signed)
Pt came by to pick up his FMLA forms that had been filled out and paid his $29 charge. There was a loose form that you had questioned me about earlier and patient verified it was for him. I gave him what was done and told him that LSL had left for the day and I would let her be aware and call him when it was ready. Patient agreed. I laid form in your chair.

## 2013-07-31 NOTE — Telephone Encounter (Signed)
LMOM for patient to return my call. His other form has been filled out. I wanted to know if patient wants me to mail it to him or if wants to stop by to pick it up. One side of the form he needs to fill out, but LSL has done the rest.

## 2013-07-31 NOTE — Telephone Encounter (Signed)
Completed. Given to nurse to put ICD codes on them.

## 2013-08-15 ENCOUNTER — Encounter: Payer: Self-pay | Admitting: Internal Medicine

## 2013-08-15 ENCOUNTER — Encounter (INDEPENDENT_AMBULATORY_CARE_PROVIDER_SITE_OTHER): Payer: Self-pay

## 2013-08-15 ENCOUNTER — Ambulatory Visit (INDEPENDENT_AMBULATORY_CARE_PROVIDER_SITE_OTHER): Payer: 59 | Admitting: Internal Medicine

## 2013-08-15 VITALS — BP 142/82 | HR 89 | Temp 98.2°F | Ht 66.0 in | Wt 232.4 lb

## 2013-08-15 DIAGNOSIS — K648 Other hemorrhoids: Secondary | ICD-10-CM

## 2013-08-15 NOTE — Progress Notes (Signed)
[facility] banding procedure note:  Symptomatic hemorrhoids and multiple colonic adenomas found at recent colonoscopy. Due for surveillance examination in 3 years Recently treated for H. pylori Have symptomatic grade 2 hemorrhoids with itching and burning from time to time. Patient desires hemorrhoid banding treatment.  The patient presents with symptomatic grade 2 hemorrhoids; Desires hemorrhoid banding. All risks, benefits, and alternative forms of therapy were described and informed consent was obtained.  In the left lateral decubitus position, digital rectal exam performed. Normal aside from rather tight sphincter tone. Subsequently anoscopy performed revealing a prominent left lateral hemorrhoid column only.  The decision was made to band the left lateral internal hemorrhoid; the [place] was used to perform band ligation without complication. Digital anorectal examination was then performed to assure proper positioning of the band;  band found to be in excellent position. No pinching or pain after placement. The patient was discharged home without pain or other issues. Dietary and behavioral recommendations were given.The patient will return in 2-3 weeks  for followup and possible additional banding as required.  No complications were encountered and the patient tolerated the procedure well.

## 2013-08-15 NOTE — Patient Instructions (Signed)
Avoid straining.  Benefiber 2 teaspoons twice daily  Limit toilet time to 2-3 minutes  Call with any interim problems  Schedule followup appointment in 2-3 weeks from now   

## 2013-08-17 ENCOUNTER — Encounter (HOSPITAL_COMMUNITY): Payer: Self-pay | Admitting: Emergency Medicine

## 2013-08-17 ENCOUNTER — Emergency Department (HOSPITAL_COMMUNITY)
Admission: EM | Admit: 2013-08-17 | Discharge: 2013-08-17 | Disposition: A | Discharge status: Home / Self Care | Payer: 59 | Attending: Emergency Medicine | Admitting: Emergency Medicine

## 2013-08-17 ENCOUNTER — Telehealth: Payer: Self-pay

## 2013-08-17 ENCOUNTER — Ambulatory Visit: Payer: 59 | Admitting: Gastroenterology

## 2013-08-17 DIAGNOSIS — Z79899 Other long term (current) drug therapy: Secondary | ICD-10-CM | POA: Insufficient documentation

## 2013-08-17 DIAGNOSIS — M5430 Sciatica, unspecified side: Secondary | ICD-10-CM | POA: Insufficient documentation

## 2013-08-17 DIAGNOSIS — IMO0002 Reserved for concepts with insufficient information to code with codable children: Secondary | ICD-10-CM | POA: Insufficient documentation

## 2013-08-17 DIAGNOSIS — Z860100 Personal history of colon polyps, unspecified: Secondary | ICD-10-CM | POA: Insufficient documentation

## 2013-08-17 DIAGNOSIS — G8929 Other chronic pain: Secondary | ICD-10-CM | POA: Insufficient documentation

## 2013-08-17 DIAGNOSIS — Z8619 Personal history of other infectious and parasitic diseases: Secondary | ICD-10-CM | POA: Insufficient documentation

## 2013-08-17 DIAGNOSIS — Z7982 Long term (current) use of aspirin: Secondary | ICD-10-CM | POA: Insufficient documentation

## 2013-08-17 DIAGNOSIS — Z87891 Personal history of nicotine dependence: Secondary | ICD-10-CM | POA: Insufficient documentation

## 2013-08-17 DIAGNOSIS — I10 Essential (primary) hypertension: Secondary | ICD-10-CM | POA: Insufficient documentation

## 2013-08-17 DIAGNOSIS — M5431 Sciatica, right side: Secondary | ICD-10-CM

## 2013-08-17 DIAGNOSIS — K219 Gastro-esophageal reflux disease without esophagitis: Secondary | ICD-10-CM | POA: Insufficient documentation

## 2013-08-17 DIAGNOSIS — R350 Frequency of micturition: Secondary | ICD-10-CM | POA: Insufficient documentation

## 2013-08-17 LAB — URINALYSIS, ROUTINE W REFLEX MICROSCOPIC
Bilirubin Urine: NEGATIVE
Glucose, UA: NEGATIVE mg/dL
Hgb urine dipstick: NEGATIVE
Ketones, ur: NEGATIVE mg/dL
Leukocytes, UA: NEGATIVE
Nitrite: NEGATIVE
Protein, ur: NEGATIVE mg/dL
Specific Gravity, Urine: 1.02 (ref 1.005–1.030)
Urobilinogen, UA: 0.2 mg/dL (ref 0.0–1.0)
pH: 5.5 (ref 5.0–8.0)

## 2013-08-17 MED ORDER — CYCLOBENZAPRINE HCL 10 MG PO TABS
10.0000 mg | ORAL_TABLET | Freq: Once | ORAL | Status: AC
  Administered 2013-08-17: 10 mg via ORAL
  Filled 2013-08-17: qty 1

## 2013-08-17 MED ORDER — CYCLOBENZAPRINE HCL 10 MG PO TABS: 10.0000 mg | ORAL_TABLET | Freq: Two times a day (BID) | ORAL | Status: DC | PRN

## 2013-08-17 NOTE — ED Provider Notes (Signed)
CSN: [id number]     Arrival date & time 08/17/13  1253 History   First MD Initiated Contact with Patient 08/17/13 1328     Chief Complaint  Patient presents with  . Hip Pain     (Consider location/radiation/quality/duration/timing/severity/associated sxs/prior Treatment) Patient is a 53 y.o. male presenting with hip pain. The history is provided by the patient.  Hip Pain This is a new problem. The current episode started more than 1 month ago. The problem occurs constantly. The problem has been gradually worsening. Associated symptoms include urinary symptoms. Pertinent negatives include no abdominal pain, chest pain, chills, fever, headaches, nausea, rash or vomiting. The symptoms are aggravated by bending, standing and walking. Treatments tried: percocet. The treatment provided no relief.   Calvin Henry is a 53 y.o. male who presents to the ED with right hip pain. He has had the pain for over a month. The pain is getting progressively worse. The pain radiates to the back of the right leg. He also had his hemorrhoids banded yesterday and is having pain from that. He took a Ambulance person without relief.   Past Medical History  Diagnosis Date  . Hypertension   . Acid reflux   . Chronic back pain   . Sleep apnea     STOP BANG score: 6  . H. pylori infection     treated with prevpac  . Tubular adenoma    Past Surgical History  Procedure Laterality Date  . Esophagogastroduodenoscopy  08/07/10    schatzkis ring otherwise normal, s/p 56-F dilation, small hiatal hernia.chronic active gastritis on bx  . Colonoscopy  08/07/10    friable anal canal otherwise normal  . No past surgeries    . Umbilical hernia repair  08/05/2011    Procedure: HERNIA REPAIR UMBILICAL ADULT;  Surgeon: Jamesetta So, MD;  Location: [facility];  Service: General;  Laterality: [name];  . Hernia repair    . Colonoscopy with esophagogastroduodenoscopy (egd) [name] 07/06/2013    Dr. Gala Romney- inflamed hemorrhoids- multiple colonic  polyps= tubular adenoma. stomach bx= hpylori treated with prevpac   Family History  Problem Relation Age of Onset  . Diabetes Mother   . Hypertension Mother    History  Substance Use Topics  . Smoking status: Former Smoker -- 0.50 packs/day for 10 years    Types: Cigarettes    Quit date: 12/01/1986  . Smokeless tobacco: Never Used     Comment: Quit x 20 plus years  . Alcohol Use: 0.0 oz/week     Comment: Occ    Review of Systems  Constitutional: Negative for fever and chills.  HENT: Negative.   Eyes: Negative for visual disturbance.  Respiratory: Negative for shortness of breath.   Cardiovascular: Negative for chest pain.  Gastrointestinal: Negative for nausea, vomiting and abdominal pain.  Genitourinary: Positive for frequency. Negative for dysuria and urgency.  Musculoskeletal:       Right hip pain  Skin: Negative for rash.  Neurological: Negative for syncope and headaches.  Psychiatric/Behavioral: Negative for confusion. The patient is not nervous/anxious.       Allergies  Review of patient's allergies indicates no known allergies.  Home Medications   Prior to Admission medications   Medication Sig Start Date End Date Taking? Authorizing Provider  amLODipine-valsartan (EXFORGE) 10-160 MG per tablet Take 1 tablet by mouth daily.      Historical Provider, MD  aspirin EC 81 MG tablet Take 81 mg by mouth daily.    Historical Provider, MD  hydrocortisone (ANUSOL-HC) 2.5 % rectal cream Place 1 application rectally 2 (two) times daily. 07/18/13   Orvil Feil, NP  Omega-3 Fatty Acids (FISH OIL PO) Take 1 capsule by mouth daily.     Historical Provider, MD  omeprazole (PRILOSEC) 20 MG capsule Take 1 capsule (20 mg total) by mouth 2 (two) times daily before a meal. 06/21/13   Mahala Menghini, PA-C  oxyCODONE-acetaminophen (PERCOCET) 7.5-325 MG per tablet Take 1-2 tablets by mouth every 4 (four) hours as needed for pain. Pain 08/05/11   Jamesetta So, MD  polyethylene  glycol-electrolytes (TRILYTE) 420 G solution Take 4,000 mLs by mouth as directed. 06/21/13   Daneil Dolin, MD   BP 128/73  Temp(Src) 98 F (36.7 C) (Oral)  Resp 16  Ht 5\' 6"  (1.676 m)  Wt 243 lb (110.224 kg)  BMI 39.24 kg/m2  SpO2 97% Physical Exam  Nursing note and vitals reviewed. Constitutional: He is oriented to person, place, and time. He appears well-developed and well-nourished. No distress.  HENT:  Head: Normocephalic and atraumatic.  Eyes: EOM are normal. Pupils are equal, round, and reactive to light.  Neck: Normal range of motion. Neck supple.  Cardiovascular: Normal rate and regular rhythm.   Pulmonary/Chest: Effort normal. No respiratory distress. He has no wheezes. He has no rales.  Abdominal: Soft. Bowel sounds are normal. There is no tenderness.  Musculoskeletal: Normal range of motion. He exhibits no edema.       Lumbar back: He exhibits tenderness. He exhibits normal range of motion, no deformity, no spasm and normal pulse.       Back:  Pedal pulses equal, adequate circulation, good touch sensation. Pain with palpation over the right sciatic nerve with palpation and range of motion. Ambulatory without foot drag.   Neurological: He is alert and oriented to person, place, and time. He has normal strength. No cranial nerve deficit or sensory deficit. Coordination and gait normal.  Reflex Scores:      Bicep reflexes are 2+ on the right side and 2+ on the left side.      Brachioradialis reflexes are 2+ on the right side and 2+ on the left side.      Patellar reflexes are 2+ on the right side and 2+ on the left side.      Achilles reflexes are 2+ on the right side and 2+ on the left side. Skin: Skin is warm and dry.  Psychiatric: He has a normal mood and affect. His behavior is normal.   Results for orders placed during the hospital encounter of 08/17/13 (from the past 24 hour(s))  URINALYSIS, ROUTINE W REFLEX MICROSCOPIC     Status: None   Collection Time     08/17/13  2:03 PM      Result Value Ref Range   Color, Urine YELLOW  YELLOW   APPearance CLEAR  CLEAR   Specific Gravity, Urine 1.020  1.005 - 1.030   pH 5.5  5.0 - 8.0   Glucose, UA NEGATIVE  NEGATIVE mg/dL   Hgb urine dipstick NEGATIVE  NEGATIVE   Bilirubin Urine NEGATIVE  NEGATIVE   Ketones, ur NEGATIVE  NEGATIVE mg/dL   Protein, ur NEGATIVE  NEGATIVE mg/dL   Urobilinogen, UA 0.2  0.0 - 1.0 mg/dL   Nitrite NEGATIVE  NEGATIVE   Leukocytes, UA NEGATIVE  NEGATIVE    ED Course  Procedures  MDM  53 y.o. male with right sciatic pain. Will teat with muscle relaxants and he will  take his pain medication that he has at home. Stable for discharge without neuro deficits. Discussed with the patient and all questioned fully answered. He will follow up with his PCP or return here if any problems arise.    Medication List    TAKE these medications       cyclobenzaprine 10 MG tablet  Commonly known as:  FLEXERIL  Take 1 tablet (10 mg total) by mouth 2 (two) times daily as needed for muscle spasms.      ASK your doctor about these medications       amLODipine-valsartan 10-160 MG per tablet  Commonly known as:  EXFORGE  Take 1 tablet by mouth daily.     aspirin EC 81 MG tablet  Take 81 mg by mouth daily.     FISH OIL PO  Take 1 capsule by mouth daily.     hydrocortisone 2.5 % rectal cream  Commonly known as:  ANUSOL-HC  Place 1 application rectally daily as needed for hemorrhoids or itching.     omeprazole 20 MG capsule  Commonly known as:  PRILOSEC  Take 20 mg by mouth daily.     oxyCODONE-acetaminophen 7.5-325 MG per tablet  Commonly known as:  PERCOCET  Take 1-2 tablets by mouth every 4 (four) hours as needed for pain. Pain          Ashley Murrain, NP 08/17/13 1438

## 2013-08-17 NOTE — Discharge Instructions (Signed)
Continue to take your pain medication and add the muscle relaxant. Do not take if you are driving as it will make you sleepy.

## 2013-08-17 NOTE — Telephone Encounter (Signed)
Late entry- pt called at 4:30 yesterday requesting a note to be out of work today because of the banding procedure he had done on Tuesday. I advised pt that we normally dont have to take pts out of work unless he was having some problems with the procedure. I asked him if he was having any pain or pinching or any problems with the procedure. He said no,but he did still have some pressure. I advised him that if he was having pain that we needed to see him. I offered to bring the pt in to the office to have the band checked and he stated no he didn't want to do that and that he would just go to work.

## 2013-08-17 NOTE — ED Provider Notes (Signed)
Medical screening examination/treatment/procedure(s) were performed by non-physician practitioner and as supervising physician I was immediately available for consultation/collaboration.   EKG Interpretation None       Nat Christen, MD 08/17/13 1523

## 2013-08-17 NOTE — ED Notes (Signed)
Pain rt hip and down to  Knee.  For over 1 month.and getting worse.  No known injury

## 2013-08-17 NOTE — ED Notes (Signed)
Patient c/o right hip pain that radiates into right leg. Per patient no known injury. Patient reports pain x 1.5 months, but states that pain is getting progressively worse. Patient ambulated to triage, gait steady.

## 2013-08-17 NOTE — Telephone Encounter (Signed)
As discussed on the day of the procedure, it was okay to give him a note for the day of procedure only. He should not need to be out of work beyond that.

## 2013-08-30 ENCOUNTER — Telehealth: Payer: Self-pay

## 2013-08-30 NOTE — Telephone Encounter (Signed)
Pt aware of what LSL said

## 2013-08-30 NOTE — Telephone Encounter (Signed)
Pt had a hemorrhoid banding on 08/15/13. He is seeing blood when he wipes and it is in his stool also.He has an appointment on 09/05/13 but he is wanting to know if he will be ok until then. Please advise

## 2013-08-30 NOTE — Telephone Encounter (Signed)
May have some bleeding from banding/ulceration left post-banding. If limited blood per rectum, this should resolve with healing of hemorrhoid site. If significant bleeding go to ER.  Please let patient know above. Please run this by Dr. Gala Romney tomorrow.

## 2013-08-31 NOTE — Telephone Encounter (Signed)
Agree with advice given yesterday. 

## 2013-09-05 ENCOUNTER — Ambulatory Visit (INDEPENDENT_AMBULATORY_CARE_PROVIDER_SITE_OTHER): Payer: 59 | Admitting: Internal Medicine

## 2013-09-05 ENCOUNTER — Encounter: Payer: Self-pay | Admitting: Internal Medicine

## 2013-09-05 ENCOUNTER — Encounter (INDEPENDENT_AMBULATORY_CARE_PROVIDER_SITE_OTHER): Payer: Self-pay

## 2013-09-05 VITALS — BP 124/74 | HR 90 | Temp 98.0°F | Ht 69.0 in | Wt 230.8 lb

## 2013-09-05 DIAGNOSIS — Z86010 Personal history of colonic polyps: Secondary | ICD-10-CM

## 2013-09-05 DIAGNOSIS — K649 Unspecified hemorrhoids: Secondary | ICD-10-CM

## 2013-09-05 NOTE — Progress Notes (Signed)
Primary Care Physician:  Leonides Grills, MD Primary Gastroenterologist:  Dr. Gala Romney  Pre-Procedure History & Physical: HPI:  Calvin Henry is a 53 y.o. male here for followup of hemorrhoids. Status post banding of grade 2 left lateral hemorrhoid back on May 5. Some bleeding after placement of the band. However, he states all of his hemorrhoid symptoms have pretty much resolved. He's taking fiber supplementation daily. He is happy. He does not feel he needs further banding at this time. History of multiple colonic adenomas removed previously he will be due for a surveillance colonoscopy in 3 years.  Past Medical History  Diagnosis Date  . Hypertension   . Acid reflux   . Chronic back pain   . Sleep apnea     STOP BANG score: 6  . H. pylori infection     treated with prevpac  . Tubular adenoma   . Hemorrhoids     Past Surgical History  Procedure Laterality Date  . Esophagogastroduodenoscopy  08/07/10    schatzkis ring otherwise normal, s/p 56-F dilation, small hiatal hernia.chronic active gastritis on bx  . Colonoscopy  08/07/10    friable anal canal otherwise normal  . No past surgeries    . Umbilical hernia repair  08/05/2011    Procedure: HERNIA REPAIR UMBILICAL ADULT;  Surgeon: Jamesetta So, MD;  Location: [facility];  Service: General;  Laterality: [name];  . Hernia repair    . Colonoscopy with esophagogastroduodenoscopy (egd) [name] 07/06/2013    Dr. Gala Romney- inflamed hemorrhoids- multiple colonic polyps= tubular adenoma. stomach bx= hpylori treated with prevpac  . Hemorrhoid banding      Prior to Admission medications   Medication Sig Start Date End Date Taking? Authorizing Provider  amLODipine-valsartan (EXFORGE) 10-160 MG per tablet Take 1 tablet by mouth daily.     Yes Historical Provider, MD  aspirin EC 81 MG tablet Take 81 mg by mouth daily.   Yes Historical Provider, MD  cyclobenzaprine (FLEXERIL) 10 MG tablet Take 1 tablet (10 mg total) by mouth 2 (two) times daily as  needed for muscle spasms. 08/17/13  Yes Hope Bunnie Pion, NP  hydrocortisone (ANUSOL-HC) 2.5 % rectal cream Place 1 application rectally daily as needed for hemorrhoids or itching.   Yes Historical Provider, MD  Omega-3 Fatty Acids (FISH OIL PO) Take 1 capsule by mouth daily.    Yes Historical Provider, MD  omeprazole (PRILOSEC) 20 MG capsule Take 20 mg by mouth daily.  06/21/13  Yes Mahala Menghini, PA-C  oxyCODONE-acetaminophen (PERCOCET) 7.5-325 MG per tablet Take 1-2 tablets by mouth every 4 (four) hours as needed for pain. Pain 08/05/11  Yes Jamesetta So, MD    Allergies as of 09/05/2013  . (No Known Allergies)    Family History  Problem Relation Age of Onset  . Diabetes Mother   . Hypertension Mother     History   Social History  . Marital Status: Married    Spouse Name: [name]    Number of Children: [name]  . Years of Education: [name]   Occupational History  . Not on file.   Social History Main Topics  . Smoking status: Former Smoker -- 0.50 packs/day for 10 years    Types: Cigarettes    Quit date: 12/01/1986  . Smokeless tobacco: Never Used     Comment: Quit x 20 plus years  . Alcohol Use: 0.0 oz/week     Comment: Occ  . Drug Use: No     Comment:  remote marijuana use   . Sexual Activity: Yes   Other Topics Concern  . Not on file   Social History Narrative  . No narrative on file    Review of Systems: See HPI, otherwise negative ROS  Physical Exam: BP 124/74  Pulse 90  Temp(Src) 98 F (36.7 C) (Oral)  Ht 5\' 9"  (1.753 m)  Wt 230 lb 12.8 oz (104.69 kg)  BMI 34.07 kg/m2 General:   Alert,  Well-developed, well-nourished, pleasant and cooperative in NAD Detailed exam deferred Impression:  Status post banding of grade 2 hemorrhoid previously. He now has no hemorrhoid symptoms. He does not feel he needs further banding at this time and I agree.   History of multiple colonic polyps.  Recommendations:    Avoid straining.  Benefiber 2 teaspoons twice daily/ or similar  fiber supplement daily  Limit toilet time to 2-3 minutes  Call with any interim problems  Colonoscopy in 3 years                  Notice: This dictation was prepared with Dragon dictation along with smaller phrase technology. Any transcriptional errors that result from this process are unintentional and may not be corrected upon review.

## 2013-09-05 NOTE — Patient Instructions (Addendum)
Avoid straining.  Benefiber 2 teaspoons twice daily/ or similar fiber supplement daily  Limit toilet time to 2-3 minutes  Call with any interim problems  Colonoscopy in 3 years

## 2013-11-20 ENCOUNTER — Emergency Department (HOSPITAL_COMMUNITY)
Admission: EM | Admit: 2013-11-20 | Discharge: 2013-11-20 | Disposition: A | Discharge status: Home / Self Care | Payer: BC Managed Care – PPO | Attending: Emergency Medicine | Admitting: Emergency Medicine

## 2013-11-20 ENCOUNTER — Encounter (HOSPITAL_COMMUNITY): Payer: Self-pay | Admitting: Emergency Medicine

## 2013-11-20 DIAGNOSIS — Z860100 Personal history of colon polyps, unspecified: Secondary | ICD-10-CM | POA: Insufficient documentation

## 2013-11-20 DIAGNOSIS — I10 Essential (primary) hypertension: Secondary | ICD-10-CM | POA: Insufficient documentation

## 2013-11-20 DIAGNOSIS — M549 Dorsalgia, unspecified: Secondary | ICD-10-CM | POA: Insufficient documentation

## 2013-11-20 DIAGNOSIS — Z87891 Personal history of nicotine dependence: Secondary | ICD-10-CM | POA: Insufficient documentation

## 2013-11-20 DIAGNOSIS — IMO0002 Reserved for concepts with insufficient information to code with codable children: Secondary | ICD-10-CM | POA: Insufficient documentation

## 2013-11-20 DIAGNOSIS — G8929 Other chronic pain: Secondary | ICD-10-CM | POA: Insufficient documentation

## 2013-11-20 DIAGNOSIS — Z79899 Other long term (current) drug therapy: Secondary | ICD-10-CM | POA: Insufficient documentation

## 2013-11-20 DIAGNOSIS — M545 Low back pain, unspecified: Secondary | ICD-10-CM | POA: Insufficient documentation

## 2013-11-20 DIAGNOSIS — K219 Gastro-esophageal reflux disease without esophagitis: Secondary | ICD-10-CM | POA: Insufficient documentation

## 2013-11-20 DIAGNOSIS — Z8619 Personal history of other infectious and parasitic diseases: Secondary | ICD-10-CM | POA: Insufficient documentation

## 2013-11-20 DIAGNOSIS — Z7982 Long term (current) use of aspirin: Secondary | ICD-10-CM | POA: Insufficient documentation

## 2013-11-20 MED ORDER — IBUPROFEN 800 MG PO TABS
800.0000 mg | ORAL_TABLET | Freq: Once | ORAL | Status: AC
  Administered 2013-11-20: 800 mg via ORAL
  Filled 2013-11-20: qty 1

## 2013-11-20 NOTE — ED Provider Notes (Signed)
CSN: [id number]     Arrival date & time 11/20/13  0636 History   First MD Initiated Contact with Patient 11/20/13 [phone]     Chief Complaint  Patient presents with  . Back Pain     Patient is a 53 y.o. male presenting with back pain. The history is provided by the patient.  Back Pain Location:  Lumbar spine Quality:  Aching Radiates to:  Does not radiate Pain severity:  Moderate Timing:  Constant Progression:  Unchanged Chronicity:  Chronic Relieved by:  Nothing Worsened by:  Movement Associated symptoms: no abdominal pain, no bladder incontinence, no bowel incontinence, no dysuria, no fever and no weakness   Risk factors: no recent surgery     Past Medical History  Diagnosis Date  . Hypertension   . Acid reflux   . Chronic back pain   . Sleep apnea     STOP BANG score: 6  . H. pylori infection     treated with prevpac  . Tubular adenoma   . Hemorrhoids    Past Surgical History  Procedure Laterality Date  . Esophagogastroduodenoscopy  08/07/10    schatzkis ring otherwise normal, s/p 56-F dilation, small hiatal hernia.chronic active gastritis on bx  . Colonoscopy  08/07/10    friable anal canal otherwise normal  . No past surgeries    . Umbilical hernia repair  08/05/2011    Procedure: HERNIA REPAIR UMBILICAL ADULT;  Surgeon: Jamesetta So, MD;  Location: [facility];  Service: General;  Laterality: [name];  . Hernia repair    . Colonoscopy with esophagogastroduodenoscopy (egd) [name] 07/06/2013    Dr. Gala Romney- inflamed hemorrhoids- multiple colonic polyps= tubular adenoma. stomach bx= hpylori treated with prevpac  . Hemorrhoid banding     Family History  Problem Relation Age of Onset  . Diabetes Mother   . Hypertension Mother    History  Substance Use Topics  . Smoking status: Former Smoker -- 0.50 packs/day for 10 years    Types: Cigarettes    Quit date: 12/01/1986  . Smokeless tobacco: Never Used     Comment: Quit x 20 plus years  . Alcohol Use: 0.0 oz/week     Comment:  Occ    Review of Systems  Constitutional: Negative for fever.  Gastrointestinal: Negative for abdominal pain and bowel incontinence.  Genitourinary: Negative for bladder incontinence and dysuria.  Musculoskeletal: Positive for back pain.  Neurological: Negative for weakness.      Allergies  Review of patient's allergies indicates no known allergies.  Home Medications   Prior to Admission medications   Medication Sig Start Date End Date Taking? Authorizing Provider  amLODipine-valsartan (EXFORGE) 10-160 MG per tablet Take 1 tablet by mouth daily.     Yes Historical Provider, MD  aspirin EC 81 MG tablet Take 81 mg by mouth daily.   Yes Historical Provider, MD  Omega-3 Fatty Acids (FISH OIL PO) Take 1 capsule by mouth daily.    Yes Historical Provider, MD  omeprazole (PRILOSEC) 20 MG capsule Take 20 mg by mouth daily.  06/21/13  Yes Mahala Menghini, PA-C  oxyCODONE-acetaminophen (PERCOCET) 7.5-325 MG per tablet Take 1-2 tablets by mouth every 4 (four) hours as needed for pain. Pain 08/05/11  Yes Jamesetta So, MD  cyclobenzaprine (FLEXERIL) 10 MG tablet Take 1 tablet (10 mg total) by mouth 2 (two) times daily as needed for muscle spasms. 08/17/13   Wantagh, NP  hydrocortisone (ANUSOL-HC) 2.5 % rectal cream Place 1 application rectally  daily as needed for hemorrhoids or itching.    Historical Provider, MD   BP 145/93  Pulse 88  Temp(Src) 98.4 F (36.9 C) (Oral)  Resp 20  Ht 5\' 6"  (1.676 m)  Wt 242 lb (109.77 kg)  BMI 39.08 kg/m2  SpO2 100% Physical Exam CONSTITUTIONAL: Well developed/well nourished HEAD: Normocephalic/atraumatic EYES: EOMI/PERRL ENMT: Mucous membranes moist NECK: supple no meningeal signs SPINE:lumbar spinal and paraspinal tenderness, No bruising/crepitance/stepoffs noted to spine CV: S1/S2 noted, no murmurs/rubs/gallops noted LUNGS: Lungs are clear to auscultation bilaterally, no apparent distress ABDOMEN: soft, nontender, no rebound or guarding GU:no cva  tenderness NEURO: Awake/alert, equal motor 5/5 strength noted with the following: hip flexion/knee flexion/extension, foot dorsi/plantar flexion,no sensory deficit in any dermatome.  Pt is able to ambulate unassisted. EXTREMITIES: pulses normal, full ROM SKIN: warm, color normal PSYCH: no abnormalities of mood noted   ED Course  Procedures   Pt admits chronic back pain for years, call his PCP but was unable to be seen Given ibuprofen and advised f/u with PCP We discussed strict return precautions  MDM   Final diagnoses:  Chronic back pain    Nursing notes including past medical history and social history reviewed and considered in documentation Narcotic database reviewed and considered in decision making     Sharyon Cable, MD 11/20/13 0740

## 2013-11-20 NOTE — Discharge Instructions (Signed)
Emergency care providers appreciate that many patients coming to [name] are in severe pain and we wish to address their pain in the safest, most responsible manner.  It is important to recognize however, that the proper treatment of chronic pain differs from that of the pain of injuries and acute illnesses.  Our goal is to provide quality, safe, personalized care and we thank you for giving [name] the opportunity to serve you. The use of narcotics and related agents for chronic pain syndromes may lead to additional physical and psychological problems.  Nearly as many people die from prescription narcotics each year as die from car crashes.  Additionally, this risk is increased if such prescriptions are obtained from a variety of sources.  Therefore, only your primary care physician or a pain management specialist is able to safely treat such syndromes with narcotic medications long-term.    Documentation revealing such prescriptions have been sought from multiple sources may prohibit [name] from providing a refill or different narcotic medication.  Your name may be checked first through the [place].  This database is a record of controlled substance medication prescriptions that the patient has received.  This has been established by [facility] in an effort to eliminate the dangerous, and often life threatening, practice of obtaining multiple prescriptions from different medical providers.   If you have a chronic pain syndrome (i.e. chronic headaches, recurrent back or neck pain, dental pain, abdominal or pelvis pain without a specific diagnosis, or neuropathic pain such as fibromyalgia) or recurrent visits for the same condition without an acute diagnosis, you may be treated with non-narcotics and other non-addictive medicines.  Allergic reactions or negative side effects that may be reported by a patient to such medications will not typically lead to the use of a narcotic  analgesic or other controlled substance as an alternative.   Patients managing chronic pain with a personal physician should have provisions in place for breakthrough pain.  If you are in crisis, you should call your physician.  If your physician directs you to the emergency department, please have the doctor call and speak to our attending physician concerning your care.   When patients come to the Emergency Department (ED) with acute medical conditions in which the Emergency Department physician feels appropriate to prescribe narcotic or sedating pain medication, the physician will prescribe these in very limited quantities.  The amount of these medications will last only until you can see your primary care physician in his/her office.  Any patient who returns to the ED seeking refills should expect only non-narcotic pain medications.     Prescriptions for narcotic or sedating medications that have been lost, stolen or expired will not be refilled in the Emergency Department.        SEEK IMMEDIATE MEDICAL ATTENTION IF: New numbness, tingling, weakness, or problem with the use of your arms or legs.  Severe back pain not relieved with medications.  Change in bowel or bladder control (if you lose control of stool or urine, or if you are unable to urinate) Increasing pain in any areas of the body (such as chest or abdominal pain).  Shortness of breath, dizziness or fainting.  Nausea (feeling sick to your stomach), vomiting, fever, or sweats.

## 2013-11-20 NOTE — ED Notes (Signed)
Pt states he has had pain in his back for "a long time." States he has pain every day and that some days it is worse than others. Pt sees physician once per month for back pain and will try to call and get worked in sooner than his next appointment.

## 2013-11-20 NOTE — ED Notes (Signed)
Patient reports history of chronic back pain, worsened of past couple days.

## 2013-11-20 NOTE — ED Notes (Signed)
Pt discharged to home NAD.  

## 2013-11-27 DIAGNOSIS — I10 Essential (primary) hypertension: Secondary | ICD-10-CM | POA: Insufficient documentation

## 2013-11-27 DIAGNOSIS — R002 Palpitations: Secondary | ICD-10-CM | POA: Insufficient documentation

## 2014-02-20 ENCOUNTER — Ambulatory Visit (HOSPITAL_COMMUNITY)
Admission: RE | Admit: 2014-02-20 | Discharge: 2014-02-20 | Disposition: A | Discharge status: Home / Self Care | Payer: Self-pay | Source: Ambulatory Visit | Attending: Physician Assistant | Admitting: Physician Assistant

## 2014-02-20 ENCOUNTER — Other Ambulatory Visit (HOSPITAL_COMMUNITY): Payer: Self-pay | Admitting: Physician Assistant

## 2014-02-20 DIAGNOSIS — M25552 Pain in left hip: Secondary | ICD-10-CM | POA: Insufficient documentation

## 2014-02-20 DIAGNOSIS — M79605 Pain in left leg: Secondary | ICD-10-CM

## 2015-01-28 ENCOUNTER — Ambulatory Visit (HOSPITAL_COMMUNITY)
Admission: RE | Admit: 2015-01-28 | Discharge: 2015-01-28 | Disposition: A | Discharge status: Home / Self Care | Payer: BLUE CROSS/BLUE SHIELD | Source: Ambulatory Visit | Attending: Physician Assistant | Admitting: Physician Assistant

## 2015-01-28 ENCOUNTER — Other Ambulatory Visit (HOSPITAL_COMMUNITY): Payer: Self-pay | Admitting: Physician Assistant

## 2015-01-28 DIAGNOSIS — M79642 Pain in left hand: Secondary | ICD-10-CM | POA: Insufficient documentation

## 2015-01-28 DIAGNOSIS — R52 Pain, unspecified: Secondary | ICD-10-CM

## 2015-01-28 DIAGNOSIS — M25562 Pain in left knee: Secondary | ICD-10-CM

## 2015-01-28 DIAGNOSIS — M7989 Other specified soft tissue disorders: Secondary | ICD-10-CM | POA: Insufficient documentation

## 2015-01-28 DIAGNOSIS — M25552 Pain in left hip: Secondary | ICD-10-CM | POA: Insufficient documentation

## 2015-08-26 ENCOUNTER — Ambulatory Visit (INDEPENDENT_AMBULATORY_CARE_PROVIDER_SITE_OTHER): Payer: Managed Care, Other (non HMO) | Admitting: Orthopedic Surgery

## 2015-08-26 ENCOUNTER — Ambulatory Visit (INDEPENDENT_AMBULATORY_CARE_PROVIDER_SITE_OTHER): Payer: Managed Care, Other (non HMO)

## 2015-08-26 VITALS — BP 137/85 | HR 84 | Ht 66.0 in | Wt 242.0 lb

## 2015-08-26 DIAGNOSIS — M25552 Pain in left hip: Secondary | ICD-10-CM

## 2015-08-26 DIAGNOSIS — M25562 Pain in left knee: Secondary | ICD-10-CM

## 2015-08-26 DIAGNOSIS — M1712 Unilateral primary osteoarthritis, left knee: Secondary | ICD-10-CM | POA: Diagnosis not present

## 2015-08-26 DIAGNOSIS — M5136 Other intervertebral disc degeneration, lumbar region: Secondary | ICD-10-CM

## 2015-08-26 DIAGNOSIS — M87052 Idiopathic aseptic necrosis of left femur: Secondary | ICD-10-CM | POA: Diagnosis not present

## 2015-08-26 NOTE — Addendum Note (Signed)
Addended by: Baldomero Lamy B on: 08/26/2015 03:48 PM   Modules accepted: Orders

## 2015-08-26 NOTE — Patient Instructions (Signed)
Patient referred to Dr. Ninfa Linden for anterior hip approach left total hip  We will go ahead and states the avascular necrosis with MRI, Dr. Aline Brochure will call with the MRI results

## 2015-08-26 NOTE — Progress Notes (Signed)
Chief Complaint  Patient presents with  . Knee Pain    Left knee and hip pain    HPI 55 year old male presents with painful left hip and left knee 5 years worsening unrelieved by 10 mg Percocet.  Location left iliac crest left anterior thigh medial knee Quality dull aching Severity 10 out of 10 X lines  duration 5 years or so Timing now constant Associated symptoms catching and locking giving way left leg  Occasional tingling left leg Worse with walking at work or lying in bed her walking for long periods of time  Review of systems fatigue vision disturbance ankle leg swelling abdominal pain blood in the stool constipation frequent urination excessive night urination joint pain limb pain stiffness joints swollen joints back pain burning pain in the legs which is related to his history of degenerative disc disease previously seen by Dr. Dr. Rolena Infante and treated nonoperatively  ROS  Note above findings  Past Medical History  Diagnosis Date  . Hypertension   . Acid reflux   . Chronic back pain   . Sleep apnea     STOP BANG score: 6  . H. pylori infection     treated with prevpac  . Tubular adenoma   . Hemorrhoids     Past Surgical History  Procedure Laterality Date  . Esophagogastroduodenoscopy  08/07/10    schatzkis ring otherwise normal, s/p 56-F dilation, small hiatal hernia.chronic active gastritis on bx  . Colonoscopy  08/07/10    friable anal canal otherwise normal  . No past surgeries    . Umbilical hernia repair  08/05/2011    Procedure: HERNIA REPAIR UMBILICAL ADULT;  Surgeon: Jamesetta So, MD;  Location: [facility];  Service: General;  Laterality: [name];  . Hernia repair    . Colonoscopy with esophagogastroduodenoscopy (egd) [name] 07/06/2013    Dr. Gala Romney- inflamed hemorrhoids- multiple colonic polyps= tubular adenoma. stomach bx= hpylori treated with prevpac  . Hemorrhoid banding     Family History  Problem Relation Age of Onset  . Diabetes Mother   . Hypertension  Mother    Social History  Substance Use Topics  . Smoking status: Former Smoker -- 0.50 packs/day for 10 years    Types: Cigarettes    Quit date: 12/01/1986  . Smokeless tobacco: Never Used     Comment: Quit x 20 plus years  . Alcohol Use: 0.0 oz/week     Comment: Occ    Current outpatient prescriptions:  .  amLODipine-valsartan (EXFORGE) 10-160 MG per tablet, Take 1 tablet by mouth daily.  , Disp: , Rfl:  .  aspirin EC 81 MG tablet, Take 81 mg by mouth daily., Disp: , Rfl:  .  Omega-3 Fatty Acids (FISH OIL PO), Take 1 capsule by mouth daily. , Disp: , Rfl:  .  omeprazole (PRILOSEC) 20 MG capsule, Take 20 mg by mouth daily. , Disp: , Rfl:  .  oxyCODONE-acetaminophen (PERCOCET) 7.5-325 MG per tablet, Take 1-2 tablets by mouth every 4 (four) hours as needed for pain. Pain, Disp: 40 tablet, Rfl: 0 .  hydrocortisone (ANUSOL-HC) 2.5 % rectal cream, Place 1 application rectally daily as needed for hemorrhoids or itching. Reported on 08/26/2015, Disp: , Rfl:   BP 137/85 mmHg  Pulse 84  Ht 5\' 6"  (1.676 m)  Wt 242 lb (109.77 kg)  BMI 39.08 kg/m2  Physical Exam  Constitutional: He is oriented to person, place, and time. He appears well-developed and well-nourished. No distress.  Cardiovascular: Normal rate and intact  distal pulses.   Neurological: He is alert and oriented to person, place, and time.  Skin: Skin is warm and dry. No rash noted. He is not diaphoretic. No erythema. No pallor.  Psychiatric: He has a normal mood and affect. His behavior is normal. Judgment and thought content normal.    Ortho Exam  He walks with a noticeable limp waddle side to side appearing his left hip and leg  The left hip range of motion is limited to 85 he holds it in flexion but I can get him down to neutral hip is stable he has normal muscle tone in the left leg skin is normal there is no peripheral edema the extremities warm to touch is no palpable lymph nodes in the groin sensation is normal  distally  His back is nontender today  His right hip flexors 110 no pain.  Both hips have limited internal/external rotation. Right hip stable muscle tone normal in the right leg skin is intact has no peripheral edema or swelling sensation is normal in lymph nodes in the groin are normal   Right and left upper extremity normal alignment and appearance range of motion full no contracture subluxation atrophy tremor all joints are reduced and stable muscle tone normal skin intact without rash lesion is ulceration, we see normal pulses and temperature without edema no lymphadenopathy in the axilla and sensation normal  ASSESSMENT: My personal interpretation of the images:  His left knee shows moderate arthritis without secondary bone changes just some joint space narrowing medial greater than lateral  His left hip shows joint space narrowing crest  subchondral fracture inferior osteophytes cyst formation at the superior margin of the acetabulum consistent with avascular necrosis  Pelvic x-ray was also obtained to check the opposite hip    PLAN MRI to stage the avascular necrosis  Then referral to Dr. Ninfa Linden for anterior approach total hip arthroplasty  His back pain is stable  His left knee shows mild arthritis

## 2015-08-27 ENCOUNTER — Telehealth: Payer: Self-pay | Admitting: Orthopedic Surgery

## 2015-08-27 NOTE — Addendum Note (Signed)
Addended by: Baldomero Lamy B on: 08/27/2015 05:01 PM   Modules accepted: Orders

## 2015-08-27 NOTE — Addendum Note (Signed)
Addended by: Baldomero Lamy B on: 08/27/2015 04:58 PM   Modules accepted: Orders

## 2015-08-27 NOTE — Telephone Encounter (Signed)
Dawn from [facility] [phone] called and stated that Mr. Monclova MRI has been approved.  He has switched facilities for his MRI.  He is now going to [facility] in [place].   Please fax order to them at [phone].  If you have any questions, please call Dawn at the above number.  Thanks

## 2015-08-30 ENCOUNTER — Telehealth: Payer: Self-pay | Admitting: Orthopedic Surgery

## 2015-08-30 NOTE — Telephone Encounter (Signed)
[facility] called saying that Calvin Henry has a MRI appointment Monday 5/22 @ 8:45 and they need a order. He is having a MRI of the pelvis.

## 2015-08-30 NOTE — Telephone Encounter (Signed)
MRI SCHEDULED FOR 09/02/15 8:45AM AT [facility]

## 2015-09-02 NOTE — Telephone Encounter (Signed)
ORDER SENT AGAIN THIS MORNING

## 2015-09-16 ENCOUNTER — Ambulatory Visit: Payer: Managed Care, Other (non HMO) | Admitting: Orthopedic Surgery

## 2015-09-23 ENCOUNTER — Other Ambulatory Visit: Payer: Self-pay | Admitting: Physician Assistant

## 2015-09-24 NOTE — Patient Instructions (Signed)
Calvin Henry  09/24/2015   Your procedure is scheduled on: 10/04/2015    Report to [facility]  Entrance take [facility]  elevators to 3rd floor to  [place] at   12 noon  Call this number if you have problems the morning of surgery [phone]   Remember: ONLY 1 PERSON MAY GO WITH YOU TO SHORT STAY TO GET  READY MORNING OF YOUR SURGERY.  Do not eat food after midnite.  May have clear liquids from 12 midnite until 0730am then nothing by mouth.      Take these medicines the morning of surgery with A SIP OF WATER: Prilosec                                 You may not have any metal on your body including hair pins and              piercings  Do not wear jewelry, , lotions, powders or perfumes, deodorant                       Men may shave face and neck.   Do not bring valuables to the hospital. [place].  Contacts, dentures or bridgework may not be worn into surgery.  Leave suitcase in the car. After surgery it may be brought to your room.       Special Instructions: coughing and deep breathing exercises,leg exercises               Please read over the following fact sheets you were given: _____________________________________________________________________             [facility] - Preparing for Surgery Before surgery, you can play an important role.  Because skin is not sterile, your skin needs to be as free of germs as possible.  You can reduce the number of germs on your skin by washing with CHG (chlorahexidine gluconate) soap before surgery.  CHG is an antiseptic cleaner which kills germs and bonds with the skin to continue killing germs even after washing. Please DO NOT use if you have an allergy to CHG or antibacterial soaps.  If your skin becomes reddened/irritated stop using the CHG and inform your nurse when you arrive at Short Stay. Do not shave (including legs and underarms) for at  least 48 hours prior to the first CHG shower.  You may shave your face/neck. Please follow these instructions carefully:  1.  Shower with CHG Soap the night before surgery and the  morning of Surgery.  2.  If you choose to wash your hair, wash your hair first as usual with your  normal  shampoo.  3.  After you shampoo, rinse your hair and body thoroughly to remove the  shampoo.                           4.  Use CHG as you would any other liquid soap.  You can apply chg directly  to the skin and wash                       Gently with a scrungie  or clean washcloth.  5.  Apply the CHG Soap to your body ONLY FROM THE NECK DOWN.   Do not use on face/ open                           Wound or open sores. Avoid contact with eyes, ears mouth and genitals (private parts).                       Wash face,  Genitals (private parts) with your normal soap.             6.  Wash thoroughly, paying special attention to the area where your surgery  will be performed.  7.  Thoroughly rinse your body with warm water from the neck down.  8.  DO NOT shower/wash with your normal soap after using and rinsing off  the CHG Soap.                9.  Pat yourself dry with a clean towel.            10.  Wear clean pajamas.            11.  Place clean sheets on your bed the night of your first shower and do not  sleep with pets. Day of Surgery : Do not apply any lotions/deodorants the morning of surgery.  Please wear clean clothes to the hospital/surgery center.  FAILURE TO FOLLOW THESE INSTRUCTIONS MAY RESULT IN THE CANCELLATION OF YOUR SURGERY PATIENT SIGNATURE_________________________________  NURSE SIGNATURE__________________________________  ________________________________________________________________________  WHAT IS A BLOOD TRANSFUSION? Blood Transfusion Information  A transfusion is the replacement of blood or some of its parts. Blood is made up of multiple cells which provide different functions.  Red  blood cells carry oxygen and are used for blood loss replacement.  White blood cells fight against infection.  Platelets control bleeding.  Plasma helps clot blood.  Other blood products are available for specialized needs, such as hemophilia or other clotting disorders. BEFORE THE TRANSFUSION  Who gives blood for transfusions?   Healthy volunteers who are fully evaluated to make sure their blood is safe. This is blood bank blood. Transfusion therapy is the safest it has ever been in the practice of medicine. Before blood is taken from a donor, a complete history is taken to make sure that person has no history of diseases nor engages in risky social behavior (examples are intravenous drug use or sexual activity with multiple partners). The donor's travel history is screened to minimize risk of transmitting infections, such as malaria. The donated blood is tested for signs of infectious diseases, such as HIV and hepatitis. The blood is then tested to be sure it is compatible with you in order to minimize the chance of a transfusion reaction. If you or a relative donates blood, this is often done in anticipation of surgery and is not appropriate for emergency situations. It takes many days to process the donated blood. RISKS AND COMPLICATIONS Although transfusion therapy is very safe and saves many lives, the main dangers of transfusion include:  1. Getting an infectious disease. 2. Developing a transfusion reaction. This is an allergic reaction to something in the blood you were given. Every precaution is taken to prevent this. The decision to have a blood transfusion has been considered carefully by your caregiver before blood is given. Blood is not given unless the benefits outweigh the risks. AFTER THE  TRANSFUSION  Right after receiving a blood transfusion, you will usually feel much better and more energetic. This is especially true if your red blood cells have gotten low (anemic). The  transfusion raises the level of the red blood cells which carry oxygen, and this usually causes an energy increase.  The nurse administering the transfusion will monitor you carefully for complications. HOME CARE INSTRUCTIONS  No special instructions are needed after a transfusion. You may find your energy is better. Speak with your caregiver about any limitations on activity for underlying diseases you may have. SEEK MEDICAL CARE IF:   Your condition is not improving after your transfusion.  You develop redness or irritation at the intravenous (IV) site. SEEK IMMEDIATE MEDICAL CARE IF:  Any of the following symptoms occur over the next 12 hours:  Shaking chills.  You have a temperature by mouth above 102 F (38.9 C), not controlled by medicine.  Chest, back, or muscle pain.  People around you feel you are not acting correctly or are confused.  Shortness of breath or difficulty breathing.  Dizziness and fainting.  You get a rash or develop hives.  You have a decrease in urine output.  Your urine turns a dark color or changes to pink, red, or brown. Any of the following symptoms occur over the next 10 days:  You have a temperature by mouth above 102 F (38.9 C), not controlled by medicine.  Shortness of breath.  Weakness after normal activity.  The white part of the eye turns yellow (jaundice).  You have a decrease in the amount of urine or are urinating less often.  Your urine turns a dark color or changes to pink, red, or brown. Document Released: 03/27/2000 Document Revised: 06/22/2011 Document Reviewed: 11/14/2007 ExitCare Patient Information 2014 [place].  _______________________________________________________________________  Incentive Spirometer  An incentive spirometer is a tool that can help keep your lungs clear and active. This tool measures how well you are filling your lungs with each breath. Taking long deep breaths may help reverse or  decrease the chance of developing breathing (pulmonary) problems (especially infection) following:  A long period of time when you are unable to move or be active. BEFORE THE PROCEDURE   If the spirometer includes an indicator to show your best effort, your nurse or respiratory therapist will set it to a desired goal.  If possible, sit up straight or lean slightly forward. Try not to slouch.  Hold the incentive spirometer in an upright position. INSTRUCTIONS FOR USE  3. Sit on the edge of your bed if possible, or sit up as far as you can in bed or on a chair. 4. Hold the incentive spirometer in an upright position. 5. Breathe out normally. 6. Place the mouthpiece in your mouth and seal your lips tightly around it. 7. Breathe in slowly and as deeply as possible, raising the piston or the ball toward the top of the column. 8. Hold your breath for 3-5 seconds or for as long as possible. Allow the piston or ball to fall to the bottom of the column. 9. Remove the mouthpiece from your mouth and breathe out normally. 10. Rest for a few seconds and repeat Steps 1 through 7 at least 10 times every 1-2 hours when you are awake. Take your time and take a few normal breaths between deep breaths. 11. The spirometer may include an indicator to show your best effort. Use the indicator as a goal to work toward during each repetition.  12. After each set of 10 deep breaths, practice coughing to be sure your lungs are clear. If you have an incision (the cut made at the time of surgery), support your incision when coughing by placing a pillow or rolled up towels firmly against it. Once you are able to get out of bed, walk around indoors and cough well. You may stop using the incentive spirometer when instructed by your caregiver.  RISKS AND COMPLICATIONS  Take your time so you do not get dizzy or light-headed.  If you are in pain, you may need to take or ask for pain medication before doing incentive  spirometry. It is harder to take a deep breath if you are having pain. AFTER USE  Rest and breathe slowly and easily.  It can be helpful to keep track of a log of your progress. Your caregiver can provide you with a simple table to help with this. If you are using the spirometer at home, follow these instructions: [place] IF:   You are having difficultly using the spirometer.  You have trouble using the spirometer as often as instructed.  Your pain medication is not giving enough relief while using the spirometer.  You develop fever of 100.5 F (38.1 C) or higher. SEEK IMMEDIATE MEDICAL CARE IF:   You cough up bloody sputum that had not been present before.  You develop fever of 102 F (38.9 C) or greater.  You develop worsening pain at or near the incision site. MAKE SURE YOU:   Understand these instructions.  Will watch your condition.  Will get help right away if you are not doing well or get worse. Document Released: 08/10/2006 Document Revised: 06/22/2011 Document Reviewed: 10/11/2006 ExitCare Patient Information 2014 ExitCare, [place].   ________________________________________________________________________    CLEAR LIQUID DIET   Foods Allowed                                                                     Foods Excluded  Coffee and tea, regular and decaf                             liquids that you cannot  Plain Jell-O in any flavor                                             see through such as: Fruit ices (not with fruit pulp)                                     milk, soups, orange juice  Iced Popsicles                                    All solid food Carbonated beverages, regular and diet  Cranberry, grape and apple juices Sports drinks like Gatorade Lightly seasoned clear broth or consume(fat free) Sugar, honey syrup  Sample Menu Breakfast                                Lunch                                      Supper Cranberry juice                    Beef broth                            Chicken broth Jell-O                                     Grape juice                           Apple juice Coffee or tea                        Jell-O                                      Popsicle                                                Coffee or tea                        Coffee or tea  _____________________________________________________________________

## 2015-09-25 ENCOUNTER — Encounter (HOSPITAL_COMMUNITY): Payer: Self-pay

## 2015-09-25 ENCOUNTER — Encounter (HOSPITAL_COMMUNITY)
Admission: RE | Admit: 2015-09-25 | Discharge: 2015-09-25 | Disposition: A | Discharge status: Home / Self Care | Payer: Managed Care, Other (non HMO) | Source: Ambulatory Visit | Attending: Orthopaedic Surgery | Admitting: Orthopaedic Surgery

## 2015-09-25 DIAGNOSIS — Z01812 Encounter for preprocedural laboratory examination: Secondary | ICD-10-CM | POA: Insufficient documentation

## 2015-09-25 DIAGNOSIS — Z01818 Encounter for other preprocedural examination: Secondary | ICD-10-CM | POA: Insufficient documentation

## 2015-09-25 DIAGNOSIS — I10 Essential (primary) hypertension: Secondary | ICD-10-CM | POA: Diagnosis not present

## 2015-09-25 DIAGNOSIS — M879 Osteonecrosis, unspecified: Secondary | ICD-10-CM | POA: Diagnosis not present

## 2015-09-25 DIAGNOSIS — Z0183 Encounter for blood typing: Secondary | ICD-10-CM | POA: Diagnosis not present

## 2015-09-25 HISTORY — DX: Unspecified osteoarthritis, unspecified site: M19.90

## 2015-09-25 LAB — CBC
HCT: 37.6 % — ABNORMAL LOW (ref 39.0–52.0)
Hemoglobin: 12.6 g/dL — ABNORMAL LOW (ref 13.0–17.0)
MCH: 30.1 pg (ref 26.0–34.0)
MCHC: 33.5 g/dL (ref 30.0–36.0)
MCV: 89.7 fL (ref 78.0–100.0)
Platelets: 287 10*3/uL (ref 150–400)
RBC: 4.19 MIL/uL — ABNORMAL LOW (ref 4.22–5.81)
RDW: 14.3 % (ref 11.5–15.5)
WBC: 6.1 10*3/uL (ref 4.0–10.5)

## 2015-09-25 LAB — BASIC METABOLIC PANEL
Anion gap: 8 (ref 5–15)
BUN: 17 mg/dL (ref 6–20)
CO2: 25 mmol/L (ref 22–32)
Calcium: 9.6 mg/dL (ref 8.9–10.3)
Chloride: 107 mmol/L (ref 101–111)
Creatinine, Ser: 1.06 mg/dL (ref 0.61–1.24)
GFR calc Af Amer: >60 mL/min (ref >60)
GFR calc non Af Amer: >60 mL/min (ref >60)
Glucose, Bld: 86 mg/dL (ref 65–99)
Potassium: 4.3 mmol/L (ref 3.5–5.1)
Sodium: 140 mmol/L (ref 135–145)

## 2015-09-25 LAB — SURGICAL PCR SCREEN
MRSA, PCR: NEGATIVE
Staphylococcus aureus: NEGATIVE

## 2015-09-25 LAB — ABO/RH: ABO/RH(D): O POS

## 2015-09-26 NOTE — Progress Notes (Signed)
Final EKG done 09/25/15- [facility]

## 2015-10-04 ENCOUNTER — Inpatient Hospital Stay (HOSPITAL_COMMUNITY): Payer: Managed Care, Other (non HMO) | Admitting: Anesthesiology

## 2015-10-04 ENCOUNTER — Inpatient Hospital Stay (HOSPITAL_COMMUNITY): Payer: Managed Care, Other (non HMO)

## 2015-10-04 ENCOUNTER — Inpatient Hospital Stay (HOSPITAL_COMMUNITY)
Admission: RE | Admit: 2015-10-04 | Discharge: 2015-10-05 | DRG: 470 | Disposition: A | Discharge status: Home Health | Payer: Managed Care, Other (non HMO) | Source: Ambulatory Visit | Attending: Orthopaedic Surgery | Admitting: Orthopaedic Surgery

## 2015-10-04 ENCOUNTER — Encounter (HOSPITAL_COMMUNITY): Payer: Self-pay | Admitting: Anesthesiology

## 2015-10-04 ENCOUNTER — Encounter (HOSPITAL_COMMUNITY)
Admission: RE | Disposition: A | Discharge status: Home Health | Payer: Self-pay | Source: Ambulatory Visit | Attending: Orthopaedic Surgery

## 2015-10-04 DIAGNOSIS — Z419 Encounter for procedure for purposes other than remedying health state, unspecified: Secondary | ICD-10-CM

## 2015-10-04 DIAGNOSIS — M549 Dorsalgia, unspecified: Secondary | ICD-10-CM | POA: Diagnosis present

## 2015-10-04 DIAGNOSIS — M25552 Pain in left hip: Secondary | ICD-10-CM | POA: Diagnosis present

## 2015-10-04 DIAGNOSIS — Z96642 Presence of left artificial hip joint: Secondary | ICD-10-CM

## 2015-10-04 DIAGNOSIS — M87052 Idiopathic aseptic necrosis of left femur: Secondary | ICD-10-CM

## 2015-10-04 DIAGNOSIS — G8929 Other chronic pain: Secondary | ICD-10-CM | POA: Diagnosis present

## 2015-10-04 DIAGNOSIS — I10 Essential (primary) hypertension: Secondary | ICD-10-CM | POA: Diagnosis present

## 2015-10-04 DIAGNOSIS — K219 Gastro-esophageal reflux disease without esophagitis: Secondary | ICD-10-CM | POA: Diagnosis present

## 2015-10-04 DIAGNOSIS — Z6839 Body mass index (BMI) 39.0-39.9, adult: Secondary | ICD-10-CM | POA: Diagnosis not present

## 2015-10-04 DIAGNOSIS — Z87891 Personal history of nicotine dependence: Secondary | ICD-10-CM

## 2015-10-04 HISTORY — PX: TOTAL HIP ARTHROPLASTY: SHX124

## 2015-10-04 LAB — TYPE AND SCREEN
ABO/RH(D): O POS
Antibody Screen: NEGATIVE

## 2015-10-04 SURGERY — ARTHROPLASTY, HIP, TOTAL, ANTERIOR APPROACH
Anesthesia: Spinal | Site: Hip | Laterality: Left

## 2015-10-04 MED ORDER — MIDAZOLAM HCL 2 MG/2ML IJ SOLN
INTRAMUSCULAR | Status: AC
  Filled 2015-10-04: qty 2

## 2015-10-04 MED ORDER — HYDROMORPHONE HCL 1 MG/ML IJ SOLN
0.2500 mg | INTRAMUSCULAR | Status: DC | PRN
  Administered 2015-10-04: 0.5 mg via INTRAVENOUS

## 2015-10-04 MED ORDER — METHOCARBAMOL 1000 MG/10ML IJ SOLN
500.0000 mg | Freq: Four times a day (QID) | INTRAVENOUS | Status: DC | PRN
  Administered 2015-10-04: 500 mg via INTRAVENOUS
  Filled 2015-10-04: qty 5
  Filled 2015-10-04: qty 550

## 2015-10-04 MED ORDER — PHENOL 1.4 % MT LIQD
1.0000 | OROMUCOSAL | Status: DC | PRN
Start: 2015-10-04 — End: 2015-10-05

## 2015-10-04 MED ORDER — METOCLOPRAMIDE HCL 5 MG PO TABS: 5.0000 mg | ORAL_TABLET | Freq: Three times a day (TID) | ORAL | Status: DC | PRN

## 2015-10-04 MED ORDER — DOCUSATE SODIUM 100 MG PO CAPS
100.0000 mg | ORAL_CAPSULE | Freq: Two times a day (BID) | ORAL | Status: DC
  Administered 2015-10-04 – 2015-10-05 (×2): 100 mg via ORAL
  Filled 2015-10-04 (×2): qty 1

## 2015-10-04 MED ORDER — HYDROMORPHONE HCL 1 MG/ML IJ SOLN
INTRAMUSCULAR | Status: AC
  Administered 2015-10-04: 0.5 mg via INTRAVENOUS
  Filled 2015-10-04: qty 1

## 2015-10-04 MED ORDER — AMLODIPINE BESYLATE 5 MG PO TABS
5.0000 mg | ORAL_TABLET | Freq: Every day | ORAL | Status: DC
  Administered 2015-10-05: 5 mg via ORAL
  Filled 2015-10-04: qty 1

## 2015-10-04 MED ORDER — OXYCODONE HCL 5 MG PO TABS
5.0000 mg | ORAL_TABLET | ORAL | Status: DC | PRN
  Administered 2015-10-05: 5 mg via ORAL
  Filled 2015-10-04: qty 1

## 2015-10-04 MED ORDER — ONDANSETRON HCL 4 MG/2ML IJ SOLN
INTRAMUSCULAR | Status: DC | PRN
  Administered 2015-10-04: 4 mg via INTRAVENOUS

## 2015-10-04 MED ORDER — MIDAZOLAM HCL 5 MG/5ML IJ SOLN
INTRAMUSCULAR | Status: DC | PRN
  Administered 2015-10-04: 2 mg via INTRAVENOUS

## 2015-10-04 MED ORDER — KETOROLAC TROMETHAMINE 15 MG/ML IJ SOLN
7.5000 mg | Freq: Four times a day (QID) | INTRAMUSCULAR | Status: DC
  Administered 2015-10-04 – 2015-10-05 (×3): 7.5 mg via INTRAVENOUS
  Filled 2015-10-04 (×3): qty 1

## 2015-10-04 MED ORDER — DEXAMETHASONE SODIUM PHOSPHATE 10 MG/ML IJ SOLN
INTRAMUSCULAR | Status: AC
  Filled 2015-10-04: qty 1

## 2015-10-04 MED ORDER — ZOLPIDEM TARTRATE 5 MG PO TABS: 5.0000 mg | ORAL_TABLET | Freq: Every evening | ORAL | Status: DC | PRN

## 2015-10-04 MED ORDER — POLYETHYLENE GLYCOL 3350 17 G PO PACK: 17.0000 g | PACK | Freq: Every day | ORAL | Status: DC | PRN

## 2015-10-04 MED ORDER — PANTOPRAZOLE SODIUM 40 MG PO TBEC
40.0000 mg | DELAYED_RELEASE_TABLET | Freq: Every day | ORAL | Status: DC
Start: 2015-10-05 — End: 2015-10-05
  Administered 2015-10-05: 40 mg via ORAL
  Filled 2015-10-04: qty 1

## 2015-10-04 MED ORDER — SODIUM CHLORIDE 0.9 % IV SOLN: INTRAVENOUS | Status: DC

## 2015-10-04 MED ORDER — SIMVASTATIN 20 MG PO TABS
10.0000 mg | ORAL_TABLET | Freq: Every day | ORAL | Status: DC
  Administered 2015-10-05: 10 mg via ORAL
  Filled 2015-10-04: qty 1

## 2015-10-04 MED ORDER — ONDANSETRON HCL 4 MG/2ML IJ SOLN: 4.0000 mg | Freq: Four times a day (QID) | INTRAMUSCULAR | Status: DC | PRN

## 2015-10-04 MED ORDER — SODIUM CHLORIDE 0.9 % IR SOLN
Status: DC | PRN
  Administered 2015-10-04: 1000 mL

## 2015-10-04 MED ORDER — ONDANSETRON HCL 4 MG/2ML IJ SOLN
INTRAMUSCULAR | Status: AC
  Filled 2015-10-04: qty 2

## 2015-10-04 MED ORDER — PROPOFOL 10 MG/ML IV BOLUS
INTRAVENOUS | Status: AC
  Filled 2015-10-04: qty 20

## 2015-10-04 MED ORDER — ONDANSETRON HCL 4 MG/2ML IJ SOLN: 4.0000 mg | Freq: Once | INTRAMUSCULAR | Status: DC | PRN

## 2015-10-04 MED ORDER — HYDROMORPHONE HCL 2 MG PO TABS
2.0000 mg | ORAL_TABLET | ORAL | Status: DC | PRN
  Administered 2015-10-04 – 2015-10-05 (×3): 2 mg via ORAL
  Filled 2015-10-04 (×3): qty 1

## 2015-10-04 MED ORDER — DEXAMETHASONE SODIUM PHOSPHATE 10 MG/ML IJ SOLN
INTRAMUSCULAR | Status: DC | PRN
  Administered 2015-10-04: 10 mg via INTRAVENOUS

## 2015-10-04 MED ORDER — LIDOCAINE HCL (CARDIAC) 20 MG/ML IV SOLN
INTRAVENOUS | Status: AC
  Filled 2015-10-04: qty 5

## 2015-10-04 MED ORDER — ALUM & MAG HYDROXIDE-SIMETH 200-200-20 MG/5ML PO SUSP: 30.0000 mL | ORAL | Status: DC | PRN

## 2015-10-04 MED ORDER — LIDOCAINE HCL (CARDIAC) 20 MG/ML IV SOLN
INTRAVENOUS | Status: DC | PRN
Start: 2015-10-04 — End: 2015-10-04
  Administered 2015-10-04: 100 mg via INTRAVENOUS

## 2015-10-04 MED ORDER — DIPHENHYDRAMINE HCL 12.5 MG/5ML PO ELIX: 12.5000 mg | ORAL_SOLUTION | ORAL | Status: DC | PRN

## 2015-10-04 MED ORDER — CEFAZOLIN SODIUM-DEXTROSE 2-4 GM/100ML-% IV SOLN
INTRAVENOUS | Status: AC
  Filled 2015-10-04: qty 100

## 2015-10-04 MED ORDER — CHLORHEXIDINE GLUCONATE 4 % EX LIQD: 60.0000 mL | Freq: Once | CUTANEOUS | Status: DC

## 2015-10-04 MED ORDER — PROPOFOL 500 MG/50ML IV EMUL
INTRAVENOUS | Status: DC | PRN
  Administered 2015-10-04: 50 ug/kg/min via INTRAVENOUS

## 2015-10-04 MED ORDER — BUPIVACAINE IN DEXTROSE 0.75-8.25 % IT SOLN
INTRATHECAL | Status: DC | PRN
  Administered 2015-10-04: 2 mL via INTRATHECAL

## 2015-10-04 MED ORDER — HYDROMORPHONE HCL 1 MG/ML IJ SOLN: 1.0000 mg | INTRAMUSCULAR | Status: DC | PRN

## 2015-10-04 MED ORDER — AMLODIPINE BESYLATE-VALSARTAN 10-320 MG PO TABS: 1.0000 | ORAL_TABLET | Freq: Every day | ORAL | Status: DC

## 2015-10-04 MED ORDER — PROPOFOL 10 MG/ML IV BOLUS
INTRAVENOUS | Status: AC
  Filled 2015-10-04: qty 40

## 2015-10-04 MED ORDER — STERILE WATER FOR IRRIGATION IR SOLN
Status: DC | PRN
  Administered 2015-10-04: 2000 mL

## 2015-10-04 MED ORDER — FENTANYL CITRATE (PF) 100 MCG/2ML IJ SOLN
INTRAMUSCULAR | Status: AC
  Filled 2015-10-04: qty 2

## 2015-10-04 MED ORDER — METHOCARBAMOL 500 MG PO TABS
500.0000 mg | ORAL_TABLET | Freq: Four times a day (QID) | ORAL | Status: DC | PRN
  Administered 2015-10-05: 500 mg via ORAL
  Filled 2015-10-04: qty 1

## 2015-10-04 MED ORDER — MEPERIDINE HCL 50 MG/ML IJ SOLN: 6.2500 mg | INTRAMUSCULAR | Status: DC | PRN

## 2015-10-04 MED ORDER — FENTANYL CITRATE (PF) 100 MCG/2ML IJ SOLN
INTRAMUSCULAR | Status: DC | PRN
  Administered 2015-10-04 (×2): 50 ug via INTRAVENOUS

## 2015-10-04 MED ORDER — ONDANSETRON HCL 4 MG PO TABS: 4.0000 mg | ORAL_TABLET | Freq: Four times a day (QID) | ORAL | Status: DC | PRN

## 2015-10-04 MED ORDER — MENTHOL 3 MG MT LOZG: 1.0000 | LOZENGE | OROMUCOSAL | Status: DC | PRN

## 2015-10-04 MED ORDER — CEFAZOLIN IN D5W 1 GM/50ML IV SOLN
1.0000 g | Freq: Four times a day (QID) | INTRAVENOUS | Status: AC
  Administered 2015-10-04 – 2015-10-05 (×2): 1 g via INTRAVENOUS
  Filled 2015-10-04 (×3): qty 50

## 2015-10-04 MED ORDER — ASPIRIN EC 325 MG PO TBEC
325.0000 mg | DELAYED_RELEASE_TABLET | Freq: Two times a day (BID) | ORAL | Status: DC
  Administered 2015-10-05: 325 mg via ORAL
  Filled 2015-10-04: qty 1

## 2015-10-04 MED ORDER — CEFAZOLIN SODIUM-DEXTROSE 2-4 GM/100ML-% IV SOLN
2.0000 g | INTRAVENOUS | Status: AC
  Administered 2015-10-04: 2 g via INTRAVENOUS

## 2015-10-04 MED ORDER — METOCLOPRAMIDE HCL 5 MG/ML IJ SOLN: 5.0000 mg | Freq: Three times a day (TID) | INTRAMUSCULAR | Status: DC | PRN

## 2015-10-04 MED ORDER — ACETAMINOPHEN 650 MG RE SUPP: 650.0000 mg | Freq: Four times a day (QID) | RECTAL | Status: DC | PRN

## 2015-10-04 MED ORDER — TRANEXAMIC ACID 1000 MG/10ML IV SOLN
1000.0000 mg | INTRAVENOUS | Status: AC
  Administered 2015-10-04: 1000 mg via INTRAVENOUS
  Filled 2015-10-04: qty 10

## 2015-10-04 MED ORDER — ACETAMINOPHEN 325 MG PO TABS
650.0000 mg | ORAL_TABLET | Freq: Four times a day (QID) | ORAL | Status: DC | PRN
Start: 2015-10-04 — End: 2015-10-05

## 2015-10-04 MED ORDER — LACTATED RINGERS IV SOLN
INTRAVENOUS | Status: DC
  Administered 2015-10-04: 1000 mL via INTRAVENOUS
  Administered 2015-10-04: 15:00:00 via INTRAVENOUS

## 2015-10-04 MED ORDER — IRBESARTAN 150 MG PO TABS
300.0000 mg | ORAL_TABLET | Freq: Every day | ORAL | Status: DC
  Administered 2015-10-05: 300 mg via ORAL
  Filled 2015-10-04: qty 2

## 2015-10-04 SURGICAL SUPPLY — 37 items
BAG SPEC THK2 15X12 ZIP CLS (MISCELLANEOUS) ×1
BAG ZIPLOCK 12X15 (MISCELLANEOUS) ×1 IMPLANT
BLADE SAW SGTL 18X1.27X75 (BLADE) ×2 IMPLANT
CAPT HIP TOTAL 2 ×1 IMPLANT
CELLS DAT CNTRL 66122 CELL SVR (MISCELLANEOUS) ×1 IMPLANT
CLOTH BEACON ORANGE TIMEOUT ST (SAFETY) ×2 IMPLANT
DRAPE STERI IOBAN 125X83 (DRAPES) ×2 IMPLANT
DRAPE U-SHAPE 47X51 STRL (DRAPES) ×4 IMPLANT
DRESSING AQUACEL AG SP 3.5X10 (GAUZE/BANDAGES/DRESSINGS) IMPLANT
DRSG AQUACEL AG SP 3.5X10 (GAUZE/BANDAGES/DRESSINGS) ×2
DURAPREP 26ML APPLICATOR (WOUND CARE) ×2 IMPLANT
ELECT REM PT RETURN 9FT ADLT (ELECTROSURGICAL) ×2
ELECTRODE REM PT RTRN 9FT ADLT (ELECTROSURGICAL) ×1 IMPLANT
GAUZE XEROFORM 1X8 LF (GAUZE/BANDAGES/DRESSINGS) ×1 IMPLANT
GLOVE BIO SURGEON STRL SZ7.5 (GLOVE) ×2 IMPLANT
GLOVE BIOGEL PI IND STRL 7.5 (GLOVE) IMPLANT
GLOVE BIOGEL PI IND STRL 8 (GLOVE) ×2 IMPLANT
GLOVE BIOGEL PI INDICATOR 7.5 (GLOVE) ×4
GLOVE BIOGEL PI INDICATOR 8 (GLOVE) ×2
GLOVE ECLIPSE 8.0 STRL XLNG CF (GLOVE) ×2 IMPLANT
GLOVE SURG SS PI 7.5 STRL IVOR (GLOVE) ×2 IMPLANT
GOWN STRL REUS W/ TWL XL LVL3 (GOWN DISPOSABLE) IMPLANT
GOWN STRL REUS W/TWL XL LVL3 (GOWN DISPOSABLE) ×7 IMPLANT
HANDPIECE INTERPULSE COAX TIP (DISPOSABLE) ×2
HOLDER FOLEY CATH W/STRAP (MISCELLANEOUS) ×2 IMPLANT
PACK ANTERIOR HIP CUSTOM (KITS) ×2 IMPLANT
RETRACTOR WND ALEXIS 18 MED (MISCELLANEOUS) ×1 IMPLANT
RTRCTR WOUND ALEXIS 18CM MED (MISCELLANEOUS) ×2
SET HNDPC FAN SPRY TIP SCT (DISPOSABLE) ×1 IMPLANT
STAPLER VISISTAT 35W (STAPLE) ×1 IMPLANT
SUT ETHIBOND NAB CT1 #1 30IN (SUTURE) ×2 IMPLANT
SUT VIC AB 0 CT1 36 (SUTURE) ×2 IMPLANT
SUT VIC AB 1 CT1 36 (SUTURE) ×2 IMPLANT
SUT VIC AB 2-0 CT1 27 (SUTURE) ×4
SUT VIC AB 2-0 CT1 TAPERPNT 27 (SUTURE) ×2 IMPLANT
TRAY FOLEY W/METER SILVER 16FR (SET/KITS/TRAYS/PACK) ×2 IMPLANT
YANKAUER SUCT BULB TIP NO VENT (SUCTIONS) ×2 IMPLANT

## 2015-10-04 NOTE — Transfer of Care (Signed)
Immediate Anesthesia Transfer of Care Note  Patient: Calvin Henry  Procedure(s) Performed: Procedure(s): LEFT TOTAL HIP ARTHROPLASTY ANTERIOR APPROACH (Left)  Patient Location: PACU  Anesthesia Type:Spinal  Level of Consciousness: awake  Airway & Oxygen Therapy: Patient Spontanous Breathing and Patient connected to face mask oxygen  Post-op Assessment: Report given to RN and Post -op Vital signs reviewed and stable  Post vital signs: Reviewed and stable  Last Vitals:  Filed Vitals:   10/04/15 1212  BP: 147/80  Pulse: 83  Temp: 36.7 C  Resp: 18    Last Pain:  Filed Vitals:   10/04/15 1225  PainSc: 2       Patients Stated Pain Goal: 2 ([id number])  Complications: No apparent anesthesia complications

## 2015-10-04 NOTE — Op Note (Signed)
[name], [name]:  [id number]  MEDICAL RECORD NO.:  [id number]  LOCATION:  [facility]                         FACILITY:  [facility]  PHYSICIAN:  Lind Guest. Ninfa Linden, M.D.DATE OF BIRTH:  [birth year]  DATE OF PROCEDURE:  10/04/2015 DATE OF DISCHARGE:                              OPERATIVE REPORT   PREOPERATIVE DIAGNOSIS:  End-stage avascular necrosis, left hip.  POSTOPERATIVE DIAGNOSIS:  End-stage avascular necrosis, left hip.  PROCEDURE:  Left total hip arthroplasty through direct anterior approach.  IMPLANTS:  DePuy Sector Gription acetabular component size 54 with a single screw, size 36+ 0 neutral polyethylene liner, size 11 Corail femoral component with varus offset (KLA), size 36+ 1.5 ceramic hip ball.  SURGEON:  Lind Guest. Ninfa Linden, M.D.  ASSISTANT:  Erskine Emery, PA-C.  ANESTHESIA:  Spinal.  ANTIBIOTICS:  3 g of IV Ancef.  BLOOD LOSS:  250 mL.  COMPLICATIONS:  None.  INDICATIONS:  Calvin Henry is a 55 year old gentleman with idiopathic avascular necrosis involving his left hip.  This has been well documented with x-rays and a MRI.  This shows pinning collapse of the femoral head and at this point, his pain is daily, it is detrimentally affected his activities of daily living, his quality of life as well as mobility.  Total hip arthroplasty has been recommended.  We have explained to him the risks of acute blood loss anemia, nerve and vessel injury, fracture, infection, dislocation, DVT.  He understands the goals are decreased pain, improved mobility, and overall improved quality of life.  PROCEDURE DESCRIPTION:  After informed consent was obtained, appropriate left hip was marked.  He was brought to the operating room and spinal anesthesia was obtained while he was on the stretcher.  He was then laid in a supine position on the stretcher.  A Foley catheter was placed and then both feet had traction boots applied to them.  Next, he was  placed supine on the Hana fracture table with the perineal post in place and both legs in inline skeletal traction devices, but no traction applied. His left operative hip was then prepped and draped with DuraPrep and sterile drapes.  A time-out was called, he was identified as correct patient and correct left hip.  I then made an incision just inferior and posterior to the anterior superior iliac spine and carried this slightly obliquely down the leg.  I dissected down the tensor fascia lata muscle and tensor fascia was then divided longitudinally, so we could proceed with a direct anterior approach to the hip.  We identified and cauterized the lateral femoral circumflex vessels and identified the hip capsule.  I opened up the hip capsule in L-type format, finding a large joint effusion.  We placed Cobra retractors within the joint capsule and then made our femoral neck cut with an oscillating saw just proximal to the lesser trochanter.  We completed this with an osteotome.  I placed a corkscrew guide in the femoral head and removed the femoral head in its entirety and found to have significant flaking of his cartilage.  We then placed a bent Hohmann over the medial acetabular rim and cleaned the remnants of the acetabular labrum and other debris.  We  then began reaming under direct visualization from the size 42 reamer up to the size 54, with all reamers under direct visualization, the last reamer under direct fluoroscopy, so I could obtain my depth of reaming, our inclination, and anteversion.  Once I was pleased with this, I placed the real DePuy Sector Gription acetabular component size 54 and a single screw.  I then placed a 36+ 0 neutral polyethylene liner for that size acetabular component.  Attention was then turned to the femur.  With the leg externally rotated to 120 degrees, extended and adducted, we were able to place a Mueller retractor medially and a Hohmann  retractor behind the greater trochanter.  I released the lateral joint capsule and used a box cutting osteotome to enter the femoral canal and a rongeur to lateralize.  I then began broaching from a size 8 broach using the Corail broaching system up to a size 11.  With the tight fit of the size 11, we trialed a varus offset femoral neck and 36+ 1.5 hip ball.  We brought the leg back over and up with traction and internal rotation reducing the pelvis.  We were pleased with leg length, offset, and stability.  We then dislocated the hip and removed the trial components. We were able to place the real Corail femoral component size 11 with varus offset and the real 36+ 1.5 ceramic hip ball.  We reduced this in the acetabulum and it was stable.  We then irrigated the soft tissue with normal saline solution using pulsatile lavage.  We closed the joint capsule with interrupted #1 Ethibond suture followed by running #1 Vicryl in the tensor fascia, 0 Vicryl in the deep tissue, 2-0 Vicryl in the subcutaneous tissue and interrupted staples on the skin.  Xeroform and Aquacel dressing were applied and he was then taken off the Hana table and taken to the recovery room in stable condition.  All final counts were correct.  There were no complications noted.  Of note, Erskine Emery, PA-C assisted in the entire case, his assistance was crucial for facilitating all aspects of this case.     Lind Guest. Ninfa Linden, M.D.     CYB/MEDQ  D:  10/04/2015  T:  10/04/2015  Job:  [id number]

## 2015-10-04 NOTE — Anesthesia Procedure Notes (Signed)
Spinal Patient location during procedure: OR Start time: 10/04/2015 2:03 PM End time: 10/04/2015 2:08 PM Staffing Resident/CRNA: Carleene Cooper A Preanesthetic Checklist Completed: patient identified, site marked, surgical consent, pre-op evaluation, timeout performed, IV checked, risks and benefits discussed and monitors and equipment checked Spinal Block Patient position: sitting Prep: DuraPrep Patient monitoring: heart rate Approach: midline Location: L2-3 Injection technique: single-shot Needle Needle type: Pencan  Needle gauge: 24 G Needle length: 9 cm Assessment Sensory level: T4 Additional Notes Spinal kit expiration date checked and verified. + CSF, -heme with one attempt. Pt tolerated well.

## 2015-10-04 NOTE — H&P (Signed)
TOTAL HIP ADMISSION H&P  Patient is admitted for left total hip arthroplasty.  Subjective:  Chief Complaint: left hip pain  HPI: Calvin Henry, 55 y.o. male, has a history of pain and functional disability in the left hip(s) due to idiopathic avacular necrosis and patient has failed non-surgical conservative treatments for greater than 12 weeks to include NSAID's and/or analgesics, flexibility and strengthening excercises, use of assistive devices, weight reduction as appropriate and activity modification.  Onset of symptoms was gradual starting 2 years ago with rapidlly worsening course since that time.The patient noted no past surgery on the left hip(s).  Patient currently rates pain in the left hip at 10 out of 10 with activity. Patient has night pain, worsening of pain with activity and weight bearing, trendelenberg gait, pain that interfers with activities of daily living and pain with passive range of motion. Patient has evidence of subchondral cysts, subchondral sclerosis, periarticular osteophytes and joint space narrowing by imaging studies. This condition presents safety issues increasing the risk of falls.  There is no current active infection.  Patient Active Problem List   Diagnosis Date Noted  . Avascular necrosis of bone of left hip ([place]) 10/04/2015  . Abnormal weight loss 06/21/2013  . Early satiety 06/21/2013  . Rectal bleeding 06/16/2011  . GERD 06/30/2010  . RECTAL BLEEDING 06/30/2010  . OTHER DYSPHAGIA 06/30/2010   Past Medical History  Diagnosis Date  . Hypertension   . Acid reflux   . Chronic back pain   . H. pylori infection     treated with prevpac  . Tubular adenoma   . Hemorrhoids   . Arthritis     Past Surgical History  Procedure Laterality Date  . Esophagogastroduodenoscopy  08/07/10    schatzkis ring otherwise normal, s/p 56-F dilation, small hiatal hernia.chronic active gastritis on bx  . Colonoscopy  08/07/10    friable anal canal otherwise normal   . No past surgeries    . Umbilical hernia repair  08/05/2011    Procedure: HERNIA REPAIR UMBILICAL ADULT;  Surgeon: Jamesetta So, MD;  Location: [facility];  Service: General;  Laterality: [name];  . Hernia repair    . Colonoscopy with esophagogastroduodenoscopy (egd) [name] 07/06/2013    Dr. Gala Romney- inflamed hemorrhoids- multiple colonic polyps= tubular adenoma. stomach bx= hpylori treated with prevpac  . Hemorrhoid banding      No prescriptions prior to admission   No Known Allergies  Social History  Substance Use Topics  . Smoking status: Former Smoker -- 0.50 packs/day for 10 years    Types: Cigarettes    Quit date: 12/01/1986  . Smokeless tobacco: Never Used     Comment: Quit x 20 plus years  . Alcohol Use: 0.0 oz/week     Comment: Occ    Family History  Problem Relation Age of Onset  . Diabetes Mother   . Hypertension Mother      Review of Systems  Musculoskeletal: Positive for joint pain.  All other systems reviewed and are negative.   Objective:  Physical Exam  Constitutional: He is oriented to person, place, and time. He appears well-developed and well-nourished.  HENT:  Head: Normocephalic and atraumatic.  Eyes: EOM are normal. Pupils are equal, round, and reactive to light.  Neck: Normal range of motion. Neck supple.  Cardiovascular: Normal rate and regular rhythm.   Respiratory: Effort normal and breath sounds normal.  GI: Soft. Bowel sounds are normal.  Musculoskeletal:       Left hip: He  exhibits decreased range of motion, decreased strength, tenderness, bony tenderness and crepitus.  Neurological: He is alert and oriented to person, place, and time.  Skin: Skin is warm and dry.  Psychiatric: He has a normal mood and affect.    Vital signs in last 24 hours:    Labs:   Estimated body mass index is 39.08 kg/(m^2) as calculated from the following:   Height as of 08/26/15: 5\' 6"  (1.676 m).   Weight as of 08/26/15: 109.77 kg (242 lb).   Imaging Review Plain  radiographs demonstrate severe avascular necrosis of the left hip(s). The bone quality appears to be good for age and reported activity level.  Assessment/Plan:  End stage idiopathic AVN, left hip(s)  The patient history, physical examination, clinical judgement of the provider and imaging studies are consistent with end stage AVN of the left hip(s) and total hip arthroplasty is deemed medically necessary. The treatment options including medical management, injection therapy, arthroscopy and arthroplasty were discussed at length. The risks and benefits of total hip arthroplasty were presented and reviewed. The risks due to aseptic loosening, infection, stiffness, dislocation/subluxation,  thromboembolic complications and other imponderables were discussed.  The patient acknowledged the explanation, agreed to proceed with the plan and consent was signed. Patient is being admitted for inpatient treatment for surgery, pain control, PT, OT, prophylactic antibiotics, VTE prophylaxis, progressive ambulation and ADL's and discharge planning.The patient is planning to be discharged home with home health services

## 2015-10-04 NOTE — Anesthesia Preprocedure Evaluation (Addendum)
Anesthesia Evaluation  Patient identified by MRN, date of birth, ID band Patient awake    Reviewed: Allergy & Precautions, NPO status , Patient's Chart, lab work & pertinent test results  Airway Mallampati: III  TM Distance: >3 FB Neck ROM: Full    Dental  (+) Partial Upper, Partial Lower   Pulmonary former smoker,    Pulmonary exam normal breath sounds clear to auscultation       Cardiovascular hypertension, Pt. on medications Normal cardiovascular exam Rhythm:Regular Rate:Normal     Neuro/Psych negative neurological ROS  negative psych ROS   GI/Hepatic Neg liver ROS, GERD  Medicated and Controlled,  Endo/Other  Morbid obesityHyperlipidemia  Renal/GU negative Renal ROS  negative genitourinary   Musculoskeletal  (+) Arthritis , AVN left hip Chronic back pain   Abdominal (+) + obese,   Peds  Hematology   Anesthesia Other Findings   Reproductive/Obstetrics                           Anesthesia Physical Anesthesia Plan  ASA: III  Anesthesia Plan: Spinal   Post-op Pain Management:    Induction:   Airway Management Planned: Natural Airway, Nasal Cannula and Simple Face Mask  Additional Equipment:   Intra-op Plan:   Post-operative Plan:   Informed Consent: I have reviewed the patients History and Physical, chart, labs and discussed the procedure including the risks, benefits and alternatives for the proposed anesthesia with the patient or authorized representative who has indicated his/her understanding and acceptance.   Dental advisory given  Plan Discussed with: CRNA, Anesthesiologist and Surgeon  Anesthesia Plan Comments:         Anesthesia Quick Evaluation

## 2015-10-04 NOTE — Brief Op Note (Signed)
10/04/2015  3:31 PM  PATIENT:  Calvin Henry  55 y.o. male  PRE-OPERATIVE DIAGNOSIS:  avascular necrosis left hip  POST-OPERATIVE DIAGNOSIS:  avascular necrosis left hip  PROCEDURE:  Procedure(s): LEFT TOTAL HIP ARTHROPLASTY ANTERIOR APPROACH (Left)  SURGEON:  Surgeon(s) and Role:    * Mcarthur Rossetti, MD - Primary  PHYSICIAN ASSISTANT: Benita Stabile, PA-C  ANESTHESIA:   spinal  EBL:  Total I/O In: 1000 [I.V.:1000] Out: 425 [Urine:200; Blood:225]  COUNTS:  YES  DICTATION: .Other Dictation: Dictation Number [id number]  PLAN OF CARE: Admit to inpatient   PATIENT DISPOSITION:  PACU - hemodynamically stable.   Delay start of Pharmacological VTE agent (>24hrs) due to surgical blood loss or risk of bleeding: no

## 2015-10-05 LAB — BASIC METABOLIC PANEL
Anion gap: 6 (ref 5–15)
BUN: 17 mg/dL (ref 6–20)
CO2: 23 mmol/L (ref 22–32)
Calcium: 8.8 mg/dL — ABNORMAL LOW (ref 8.9–10.3)
Chloride: 110 mmol/L (ref 101–111)
Creatinine, Ser: 1.21 mg/dL (ref 0.61–1.24)
GFR calc Af Amer: >60 mL/min (ref >60)
GFR calc non Af Amer: >60 mL/min (ref >60)
Glucose, Bld: 136 mg/dL — ABNORMAL HIGH (ref 65–99)
Potassium: 4.4 mmol/L (ref 3.5–5.1)
Sodium: 139 mmol/L (ref 135–145)

## 2015-10-05 LAB — CBC
HCT: 33.8 % — ABNORMAL LOW (ref 39.0–52.0)
Hemoglobin: 11.3 g/dL — ABNORMAL LOW (ref 13.0–17.0)
MCH: 29.5 pg (ref 26.0–34.0)
MCHC: 33.4 g/dL (ref 30.0–36.0)
MCV: 88.3 fL (ref 78.0–100.0)
Platelets: 281 10*3/uL (ref 150–400)
RBC: 3.83 MIL/uL — ABNORMAL LOW (ref 4.22–5.81)
RDW: 14.5 % (ref 11.5–15.5)
WBC: 10.3 10*3/uL (ref 4.0–10.5)

## 2015-10-05 MED ORDER — HYDROMORPHONE HCL 2 MG PO TABS: 2.0000 mg | ORAL_TABLET | ORAL | Status: DC | PRN

## 2015-10-05 MED ORDER — ASPIRIN 325 MG PO TBEC: 325.0000 mg | DELAYED_RELEASE_TABLET | Freq: Two times a day (BID) | ORAL | Status: DC

## 2015-10-05 MED ORDER — METHOCARBAMOL 500 MG PO TABS: 500.0000 mg | ORAL_TABLET | Freq: Four times a day (QID) | ORAL | Status: DC | PRN

## 2015-10-05 NOTE — Evaluation (Signed)
Physical Therapy Evaluation Patient Details Name: Calvin Henry MRN: [id number] DOB: Dec 11, 1960 Today's Date: 10/05/2015   History of Present Illness  55 yo male with End-stage avascular necrosis, left hip. Pt s/p Left total hip arthroplasty through direct anterior approach. PMH: HTN, arthritis, chronic back pain.   Clinical Impression  Pt s/p L THR presents with decreased L LE strength/ROM and post op pain limiting functional mobility.  Pt should progress to dc home with family assist and HHPT follow up.    Follow Up Recommendations Home health PT    Equipment Recommendations  Rolling walker with 5" wheels    Recommendations for Other Services OT consult     Precautions / Restrictions Precautions Precautions: Fall Precaution Comments: diret anterior approach, no precautions Restrictions Weight Bearing Restrictions: No RLE Weight Bearing: Weight bearing as tolerated      Mobility  Bed Mobility Overal bed mobility: Needs Assistance Bed Mobility: Supine to Sit;Sit to Supine     Supine to sit: Min assist;Min guard Sit to supine: Min assist;Min guard   General bed mobility comments: cues for sequence and use of R LE to self assist  Transfers Overall transfer level: Needs assistance Equipment used: Rolling walker (2 wheeled) Transfers: Sit to/from Stand Sit to Stand: Min guard         General transfer comment: cues for LE management and use of UEs to self assist  Ambulation/Gait Ambulation/Gait assistance: Min assist;Min guard Ambulation Distance (Feet): 200 Feet Assistive device: Rolling walker (2 wheeled) Gait Pattern/deviations: Step-to pattern;Step-through pattern;Decreased step length - right;Decreased step length - left;Shuffle;Trunk flexed Gait velocity: decr Gait velocity interpretation: Below normal speed for age/gender General Gait Details: cues for posture, position from RW and initial sequence  Stairs Stairs: Yes Stairs assistance: Min  assist Stair Management: No rails;Step to pattern;Forwards;With walker Number of Stairs: 4 General stair comments: 2x2 stairs with RW at top and cues for sequence and foot placement  Wheelchair Mobility    Modified Rankin (Stroke Patients Only)       Balance Overall balance assessment: Needs assistance Sitting-balance support: No upper extremity supported;Feet supported Sitting balance-Leahy Scale: Normal     Standing balance support: Bilateral upper extremity supported;During functional activity Standing balance-Leahy Scale: Fair Standing balance comment: Reliant on RW for support                             Pertinent Vitals/Pain Pain Assessment: 0-10 Pain Score: 2  Pain Location: L hip Pain Descriptors / Indicators: Aching;Sore Pain Intervention(s): Limited activity within patient's tolerance;Monitored during session;Premedicated before session;Ice applied    Home Living Family/patient expects to be discharged to:: Private residence Living Arrangements: Spouse/significant other Available Help at Discharge: Family;Available 24 hours/day Type of Home: House Home Access: Stairs to enter Entrance Stairs-Rails: None Entrance Stairs-Number of Steps: 2 Home Layout: One level Home Equipment: None      Prior Function Level of Independence: Independent               Hand Dominance   Dominant Hand: Right    Extremity/Trunk Assessment   Upper Extremity Assessment: Overall WFL for tasks assessed           Lower Extremity Assessment: LLE deficits/detail   LLE Deficits / Details: Strength at hip 2+/5 with AAROM at hip to 80 flex and 15 abd  Cervical / Trunk Assessment: Normal  Communication   Communication: No difficulties  Cognition Arousal/Alertness: Awake/alert Behavior During Therapy: [facility] for  tasks assessed/performed Overall Cognitive Status: Within Functional Limits for tasks assessed                      General Comments       Exercises Total Joint Exercises Ankle Circles/Pumps: AROM;Both;15 reps;Supine Quad Sets: AROM;Both;10 reps;Supine Heel Slides: AAROM;Left;20 reps;Supine Hip ABduction/ADduction: AAROM;Left;15 reps;Supine      Assessment/Plan    PT Assessment Patient needs continued PT services  PT Diagnosis Difficulty walking   PT Problem List Decreased strength;Decreased range of motion;Decreased activity tolerance;Decreased mobility;Decreased knowledge of use of DME;Pain;Obesity  PT Treatment Interventions DME instruction;Gait training;Stair training;Functional mobility training;Therapeutic activities;Therapeutic exercise;Patient/family education   PT Goals (Current goals can be found in the Care Plan section) Acute Rehab PT Goals Patient Stated Goal: go home today PT Goal Formulation: With patient Potential to Achieve Goals: Good    Frequency 7X/week   Barriers to discharge        Co-evaluation               End of Session Equipment Utilized During Treatment: Gait belt Activity Tolerance: Patient tolerated treatment well Patient left: in chair;with call bell/phone within reach Nurse Communication: Mobility status         Time: 5956-3875 PT Time Calculation (min) (ACUTE ONLY): 40 min   Charges:   PT Evaluation $PT Eval Low Complexity: 1 Procedure PT Treatments $Gait Training: 8-22 mins $Therapeutic Exercise: 8-22 mins   PT G Codes:        Mell Guia 10-20-2015, 12:58 PM

## 2015-10-05 NOTE — Progress Notes (Signed)
Physical Therapy Treatment Patient Details Name: Calvin Henry MRN: [id number] DOB: [birth year] Today's Date: 10/05/2015    History of Present Illness 55 yo male with End-stage avascular necrosis, left hip. Pt s/p Left total hip arthroplasty through direct anterior approach. PMH: HTN, arthritis, chronic back pain.     PT Comments    Pt progressing well with mobility and eager for return home.  Pt's spouse present and reviewed stairs, car transfers and home therex with written information provided.  Follow Up Recommendations  Home health PT     Equipment Recommendations  Rolling walker with 5" wheels    Recommendations for Other Services OT consult     Precautions / Restrictions Precautions Precautions: Fall Precaution Comments: diret anterior approach, no precautions Restrictions Weight Bearing Restrictions: No RLE Weight Bearing: Weight bearing as tolerated    Mobility  Bed Mobility Overal bed mobility: Needs Assistance Bed Mobility: Supine to Sit;Sit to Supine     Supine to sit: Min assist;Min guard Sit to supine: Min assist;Min guard   General bed mobility comments: NT - pt states comfortable with ability  Transfers Overall transfer level: Needs assistance Equipment used: Rolling walker (2 wheeled) Transfers: Sit to/from Stand Sit to Stand: Min guard;Supervision         General transfer comment: cues for LE management and use of UEs to self assist  Ambulation/Gait Ambulation/Gait assistance: Min guard;Supervision Ambulation Distance (Feet): 40 Feet Assistive device: Rolling walker (2 wheeled) Gait Pattern/deviations: Step-to pattern;Step-through pattern;Decreased step length - right;Decreased step length - left;Shuffle;Trunk flexed Gait velocity: decr Gait velocity interpretation: Below normal speed for age/gender General Gait Details: cues for posture, position from RW and initial sequence   Stairs Stairs: Yes Stairs assistance: Min  assist Stair Management: No rails;Step to pattern;Forwards;With walker Number of Stairs: 2 General stair comments: 2 stairs with RW at top and cues for sequence and foot placement  Spouse present  Wheelchair Mobility    Modified Rankin (Stroke Patients Only)       Balance Overall balance assessment: Needs assistance Sitting-balance support: No upper extremity supported;Feet supported Sitting balance-Leahy Scale: Normal     Standing balance support: Bilateral upper extremity supported;During functional activity Standing balance-Leahy Scale: Fair Standing balance comment: Reliant on RW for support                    Cognition Arousal/Alertness: Awake/alert Behavior During Therapy: WFL for tasks assessed/performed Overall Cognitive Status: Within Functional Limits for tasks assessed                      Exercises Total Joint Exercises Ankle Circles/Pumps: AROM;Both;15 reps;Supine Quad Sets: AROM;Both;10 reps;Supine Heel Slides: AAROM;Left;20 reps;Supine Hip ABduction/ADduction: AAROM;Left;15 reps;Supine    General Comments        Pertinent Vitals/Pain Pain Assessment: 0-10 Pain Score: 3  Pain Location: L hip Pain Descriptors / Indicators: Aching;Sore Pain Intervention(s): Limited activity within patient's tolerance;Monitored during session;Premedicated before session    Home Living Family/patient expects to be discharged to:: Private residence Living Arrangements: Spouse/significant other Available Help at Discharge: Family;Available 24 hours/day Type of Home: House Home Access: Stairs to enter Entrance Stairs-Rails: None Home Layout: One level Home Equipment: None      Prior Function Level of Independence: Independent          PT Goals (current goals can now be found in the care plan section) Acute Rehab PT Goals Patient Stated Goal: go home today PT Goal Formulation: With patient Potential to Achieve  Goals: Good Progress towards PT  goals: Progressing toward goals    Frequency  7X/week    PT Plan Current plan remains appropriate    Co-evaluation             End of Session Equipment Utilized During Treatment: Gait belt Activity Tolerance: Patient tolerated treatment well Patient left: in chair;with call bell/phone within reach;with family/visitor present     Time: 1308-6578 PT Time Calculation (min) (ACUTE ONLY): 23 min  Charges:  $Gait Training: 8-22 mins $Therapeutic Exercise: 8-22 mins $Therapeutic Activity: 8-22 mins                    G Codes:      Hildred Mollica 2015/10/24, 1:04 PM

## 2015-10-05 NOTE — Progress Notes (Signed)
Assessment unchanged. Pt and wife verbalized understanding of dc instructions through teach back including follow up care and when to call the doctor. Scripts given as provided by MD. Discharged via wc to front entrance by NT and wife.

## 2015-10-05 NOTE — Discharge Instructions (Signed)

## 2015-10-05 NOTE — Anesthesia Postprocedure Evaluation (Addendum)
Anesthesia Post Note  Patient: Calvin Henry  Procedure(s) Performed: Procedure(s) (LRB): LEFT TOTAL HIP ARTHROPLASTY ANTERIOR APPROACH (Left)  Patient location during evaluation: PACU Anesthesia Type: Spinal Level of consciousness: awake and alert and oriented Pain management: pain level controlled Vital Signs Assessment: post-procedure vital signs reviewed and stable Respiratory status: spontaneous breathing, nonlabored ventilation and respiratory function stable Cardiovascular status: blood pressure returned to baseline and stable Postop Assessment: no signs of nausea or vomiting, spinal receding, no backache, no headache and patient able to bend at knees Anesthetic complications: no    Last Vitals:                Sonnet Rizor A.

## 2015-10-05 NOTE — Care Management Note (Addendum)
Case Management Note  Patient Details  Name: Calvin Henry MRN: [id number] Date of Birth: 1961-02-04  Subjective/Objective:    s/p Left total hip arthroplasty                 Action/Plan: Discharge Planning: AVS reviewed:  NCM spoke to pt and wife at bedside. Explained Reader and DME was arranged with [facility], case # [id number]. Huey Romans [phone] scheduled to deliver DME to the hospital. Faxed orders, and dc summary to [facility] # [phone]. They will arrange [facility] to come out for HHPT.   Contacted [employer] for scheduled delivery to hospital today. Faxed orders to [place] # [phone].  PCP-BENJAMIN, Arline Asp MD    Expected Discharge Date:  10/05/2015               Expected Discharge Plan:  [place]  In-House Referral:  NA  Discharge planning Services  CM Consult  Post Acute Care Choice:  Home Health Choice offered to:  Patient  DME Arranged:  Walker rolling, 3-N-1 DME Agency:  Other - Comment  HH Arranged:  PT HH Agency:  Other - See comment  Status of Service:  complete  If discussed at Long Length of Stay Meetings, dates discussed:    Additional Comments:  Erenest Rasher, RN 10/05/2015, 1:25 PM

## 2015-10-05 NOTE — Progress Notes (Signed)
Subjective: 1 Day Post-Op Procedure(s) (LRB): LEFT TOTAL HIP ARTHROPLASTY ANTERIOR APPROACH (Left) Patient reports pain as moderate.  Would potentially like to go home this afternoon if clears therapy.  Objective: Vital signs in last 24 hours: Temp:  [97.7 F (36.5 C)-98.5 F (36.9 C)] 98.5 F (36.9 C) (06/24 0646) Pulse Rate:  [65-93] 80 (06/24 0646) Resp:  [10-20] 18 (06/24 0646) BP: (111-147)/(50-81) 140/80 mmHg (06/24 0646) SpO2:  [97 %-100 %] 100 % (06/24 0646) Weight:  [111.131 kg (245 lb)] 111.131 kg (245 lb) (06/23 1224)  Intake/Output from previous day: 06/23 0701 - 06/24 0700 In: 2410 [I.V.:2300; IV Piggyback:110] Out: [id number] [Urine:1150; Blood:225] Intake/Output this shift:     Recent Labs  10/05/15 0405  HGB 11.3*    Recent Labs  10/05/15 0405  WBC 10.3  RBC 3.83*  HCT 33.8*  PLT 281    Recent Labs  10/05/15 0405  NA 139  K 4.4  CL 110  CO2 23  BUN 17  CREATININE 1.21  GLUCOSE 136*  CALCIUM 8.8*   No results for input(s): LABPT, INR in the last 72 hours.  Sensation intact distally Intact pulses distally Dorsiflexion/Plantar flexion intact Incision: dressing C/D/I  Assessment/Plan: 1 Day Post-Op Procedure(s) (LRB): LEFT TOTAL HIP ARTHROPLASTY ANTERIOR APPROACH (Left) Up with therapy  Discharge to home today vs tomorrow  Mcarthur Rossetti 10/05/2015, 7:03 AM

## 2015-10-05 NOTE — Evaluation (Addendum)
Occupational Therapy Evaluation Patient Details Name: Calvin Henry MRN: [id number] DOB: 07-12-1960 Today's Date: 10/05/2015    History of Present Illness 55 yo male with End-stage avascular necrosis, left hip. Pt s/p Left total hip arthroplasty through direct anterior approach. PMH: HTN, arthritis, chronic back pain.    Clinical Impression   Patient presenting with decreased ADL and functional mobility independence secondary to above. Patient independent and working PTA. Patient currently functioning at an overall supervision/set-up to min assist level. Patient will benefit from acute OT to increase overall independence in the areas of ADLs, functional mobility, and overall safety in order to safely discharge home with wife.     Follow Up Recommendations  No OT follow up;Supervision - Intermittent    Equipment Recommendations  3 in 1 bedside comode    Recommendations for Other Services  None at this time    Precautions / Restrictions Precautions Precautions: Fall Precaution Comments: diret anterior approach, no precautions Restrictions Weight Bearing Restrictions: Yes RLE Weight Bearing: Weight bearing as tolerated     Mobility Bed Mobility General bed mobility comments: Pt found seated EOB upon OT entering room   Transfers Overall transfer level: Needs assistance Equipment used: Rolling walker (2 wheeled) Transfers: Sit to/from Stand Sit to Stand: Min assist         General transfer comment: Min assist to power up into standing. Cues for hand placement, technique, sequencing.     Balance Overall balance assessment: Needs assistance Sitting-balance support: No upper extremity supported;Feet supported Sitting balance-Leahy Scale: Normal     Standing balance support: Bilateral upper extremity supported;During functional activity Standing balance-Leahy Scale: Fair Standing balance comment: Reliant on RW for support    ADL Overall ADL's : Needs  assistance/impaired Eating/Feeding: Set up;Sitting   Grooming: Set up;Sitting   Upper Body Bathing: Set up;Sitting   Lower Body Bathing: Minimal assistance;Sit to/from stand   Upper Body Dressing : Set up;Sitting   Lower Body Dressing: Minimal assistance;Sit to/from stand   Toilet Transfer: Min guard;RW;BSC;Ambulation   Toileting- Water quality scientist and Hygiene: Minimal assistance;Sit to/from Nurse, children's Details (indicate cue type and reason): Did not occur, would like to practice prior to patient discharging home  Functional mobility during ADLs: Minimal assistance;Cueing for safety;Cueing for sequencing;Rolling walker General ADL Comments: Pt with good safety awareness and carryover. Pt will benefit from acute OT to ensure safety and indepednence post acute d/c. Discussed tub/shower transfer and encouraged pt to sponge bath first few days, then make sure wife present to provide supervision for his safety when getting in/out of tub. Also educated pt on LB dressing compensatory strategies to increase independence.     Vision Vision Assessment?: No apparent visual deficits          Pertinent Vitals/Pain Pain Assessment: No/denies pain     Hand Dominance Right   Extremity/Trunk Assessment Upper Extremity Assessment Upper Extremity Assessment: Overall WFL for tasks assessed   Lower Extremity Assessment Lower Extremity Assessment: Defer to PT evaluation   Cervical / Trunk Assessment Cervical / Trunk Assessment: Normal   Communication Communication Communication: No difficulties   Cognition Arousal/Alertness: Awake/alert Behavior During Therapy: WFL for tasks assessed/performed Overall Cognitive Status: Within Functional Limits for tasks assessed              Home Living Family/patient expects to be discharged to:: Private residence Living Arrangements: Spouse/significant other Available Help at Discharge: Family;Available 24 hours/day Type of  Home: House Home Access: Stairs to enter [employer]  of Steps: 1 Entrance Stairs-Rails: None Home Layout: One level     Bathroom Shower/Tub: Tub/shower unit;Curtain   Bathroom Toilet: Standard     Home Equipment: None   Prior Functioning/Environment Level of Independence: Independent     OT Diagnosis: Generalized weakness;Acute pain   OT Problem List: Decreased strength;Decreased range of motion;Decreased activity tolerance;Impaired balance (sitting and/or standing);Decreased safety awareness;Decreased knowledge of use of DME or AE;Decreased knowledge of precautions;Pain   OT Treatment/Interventions: Self-care/ADL training;Therapeutic exercise;Energy conservation;DME and/or AE instruction;Therapeutic activities;Patient/family education;Balance training    OT Goals(Current goals can be found in the care plan section) Acute Rehab OT Goals Patient Stated Goal: go home today OT Goal Formulation: With patient Time For Goal Achievement: 10/12/15 Potential to Achieve Goals: Good ADL Goals Pt Will Perform Grooming: with modified independence;standing Pt Will Perform Lower Body Bathing: with modified independence;sit to/from stand Pt Will Perform Lower Body Dressing: with modified independence;sit to/from stand Pt Will Transfer to Toilet: with modified independence;ambulating;bedside commode Pt Will Perform Tub/Shower Transfer: Tub transfer;ambulating;3 in 1;rolling walker;with modified independence  OT Frequency: Min 2X/week   Barriers to D/C: None known at this time   End of Session Equipment Utilized During Treatment: Gait belt;Rolling walker Nurse Communication: Mobility status;Other (comment) (need for [facility])  Activity Tolerance: Patient tolerated treatment well Patient left: in chair;with call bell/phone within reach   Time: [id number] OT Time Calculation (min): 21 min Charges:  OT General Charges $OT Visit: 1 Procedure OT Evaluation $OT Eval Low Complexity: 1  Procedure  Chrys Racer , MS, OTR/L, CLT  10/05/2015, 9:06 AM

## 2015-10-05 NOTE — Discharge Summary (Signed)
Patient ID: Calvin Henry MRN: [id number] DOB/AGE: [birth year] 55 y.o.  Admit date: 10/04/2015 Discharge date: 10/05/2015  Admission Diagnoses:  Principal Problem:   Avascular necrosis of bone of left hip [facility]) Active Problems:   Status post total replacement of left hip   Discharge Diagnoses:  Same  Past Medical History  Diagnosis Date  . Hypertension   . Acid reflux   . Chronic back pain   . H. pylori infection     treated with prevpac  . Tubular adenoma   . Hemorrhoids   . Arthritis     Surgeries: Procedure(s): LEFT TOTAL HIP ARTHROPLASTY ANTERIOR APPROACH on 10/04/2015   Consultants:    Discharged Condition: Improved  Hospital Course: Calvin Henry is an 55 y.o. male who was admitted 10/04/2015 for operative treatment ofAvascular necrosis of bone of left hip ([place]). Patient has severe unremitting pain that affects sleep, daily activities, and work/hobbies. After pre-op clearance the patient was taken to the operating room on 10/04/2015 and underwent  Procedure(s): LEFT TOTAL HIP ARTHROPLASTY ANTERIOR APPROACH.    Patient was given perioperative antibiotics: Anti-infectives    Start     Dose/Rate Route Frequency Ordered Stop   10/04/15 2000  ceFAZolin (ANCEF) IVPB 1 g/50 mL premix     1 g 100 mL/hr over 30 Minutes Intravenous Every 6 hours 10/04/15 1703 10/05/15 0230   10/04/15 1215  ceFAZolin (ANCEF) IVPB 2g/100 mL premix     2 g 200 mL/hr over 30 Minutes Intravenous On call to O.R. 10/04/15 1208 10/04/15 1410       Patient was given sequential compression devices, early ambulation, and chemoprophylaxis to prevent DVT.  Patient benefited maximally from hospital stay and there were no complications.    Recent vital signs: Patient Vitals for the past 24 hrs:  BP Temp Temp src Pulse Resp SpO2 Height Weight  10/05/15 1025 133/82 mmHg - - (!) 101 - - - -  10/05/15 0646 140/80 mmHg 98.5 F (36.9 C) Oral 80 18 100 % - -  10/05/15 0428 (!) 126/50 mmHg 98.3  F (36.8 C) Oral 69 16 97 % - -  10/05/15 0136 124/74 mmHg 97.7 F (36.5 C) Oral 85 16 98 % - -  10/04/15 2005 127/79 mmHg 98.1 F (36.7 C) Oral 93 18 100 % - -  10/04/15 1900 119/80 mmHg 97.8 F (36.6 C) Oral 93 18 100 % - -  10/04/15 1800 124/78 mmHg 98.2 F (36.8 C) Oral 80 17 98 % - -  10/04/15 1659 139/81 mmHg 98.2 F (36.8 C) - 77 16 100 % - -  10/04/15 1645 - 98.2 F (36.8 C) - 67 20 100 % - -  10/04/15 1630 123/79 mmHg - - 76 19 100 % - -  10/04/15 1615 122/78 mmHg - - 65 10 100 % - -  10/04/15 1600 113/80 mmHg - - 73 18 100 % - -  10/04/15 1545 111/71 mmHg 97.9 F (36.6 C) - 75 18 100 % - -  10/04/15 1224 - - - - - - 5\' 6"  (1.676 m) 111.131 kg (245 lb)  10/04/15 1212 (!) 147/80 mmHg 98.1 F (36.7 C) Oral 83 18 97 % - -     Recent laboratory studies:  Recent Labs  10/05/15 0405  WBC 10.3  HGB 11.3*  HCT 33.8*  PLT 281  NA 139  K 4.4  CL 110  CO2 23  BUN 17  CREATININE 1.21  GLUCOSE 136*  CALCIUM 8.8*  Discharge Medications:     Medication List    STOP taking these medications        oxyCODONE-acetaminophen 10-325 MG tablet  Commonly known as:  PERCOCET      TAKE these medications        amLODipine-valsartan 10-320 MG tablet  Commonly known as:  EXFORGE  Take 1 tablet by mouth daily.     aspirin 325 MG EC tablet  Take 1 tablet (325 mg total) by mouth 2 (two) times daily after a meal.     diclofenac 75 MG EC tablet  Commonly known as:  VOLTAREN  Take 75 mg by mouth 2 (two) times daily as needed (For arthritis or pain.).     FISH OIL PO  Take 1 capsule by mouth daily.     HYDROmorphone 2 MG tablet  Commonly known as:  DILAUDID  Take 1 tablet (2 mg total) by mouth every 4 (four) hours as needed for severe pain.     methocarbamol 500 MG tablet  Commonly known as:  ROBAXIN  Take 1 tablet (500 mg total) by mouth every 6 (six) hours as needed for muscle spasms.     omeprazole 20 MG capsule  Commonly known as:  PRILOSEC  Take 20 mg by  mouth daily as needed (For heartburn or acid reflux.).     simvastatin 10 MG tablet  Commonly known as:  ZOCOR  Take 10 mg by mouth daily.        Diagnostic Studies: Dg C-arm 61-120 Min-no Report  10/04/2015  CLINICAL DATA: Surgery C-ARM 61-120 MINUTES Fluoroscopy was utilized by the requesting physician.  No radiographic interpretation.   Dg Hip Port Unilat With Pelvis 1v Left  10/04/2015  CLINICAL DATA:  Post LEFT anterior total hip replacement EXAM: DG HIP (WITH OR WITHOUT PELVIS) 1V PORT LEFT COMPARISON:  Portable exam 1538 hours compared intraoperative images of 10/04/2015 FINDINGS: LEFT hip prosthesis identified without acute fracture or dislocation. Bones demineralized. Degenerative changes RIGHT hip joint. IMPRESSION: LEFT hip prosthesis without acute complication. Electronically Signed   By: Lavonia Dana M.D.   On: 10/04/2015 16:06   Dg Hip Operative Unilat With Pelvis Left  10/04/2015  CLINICAL DATA:  Status post left hip replacement. EXAM: OPERATIVE LEFT HIP (WITH PELVIS IF PERFORMED) 2 VIEWS TECHNIQUE: Fluoroscopic spot image(s) were submitted for interpretation post-operatively. COMPARISON:  None. FINDINGS: Bipolar left hip prosthesis is seen in expected position. No evidence of fracture or dislocation. IMPRESSION: Expected postoperative appearance of bipolar left hip prosthesis. Electronically Signed   By: Earle Gell M.D.   On: 10/04/2015 15:25    Disposition: 01-Home or Self Care      Discharge Instructions    Call MD / Call 911    Complete by:  As directed   If you experience chest pain or shortness of breath, CALL 911 and be transported to the hospital emergency room.  If you develope a fever above 101 F, pus (white drainage) or increased drainage or redness at the wound, or calf pain, call your surgeon's office.     Constipation Prevention    Complete by:  As directed   Drink plenty of fluids.  Prune juice may be helpful.  You may use a stool softener, such as Colace  (over the counter) 100 mg twice a day.  Use MiraLax (over the counter) for constipation as needed.     Diet - low sodium heart healthy    Complete by:  As directed  Discharge patient    Complete by:  As directed      Elevated toilet seat    Complete by:  As directed      Increase activity slowly as tolerated    Complete by:  As directed            Follow-up Information    Follow up with Mcarthur Rossetti, MD In 2 weeks.   Specialty:  Orthopedic Surgery   Contact information:   [place] [address] [phone]        Signed: Mcarthur Rossetti 10/05/2015, 11:58 AM

## 2015-10-08 NOTE — Progress Notes (Signed)
Received call from [employer] and spoke to [place], [phone] ext [phone]. States they are unable to locate [facility] agency after contacting 20 to take pt's case. Explained to follow up with Dr. Trevor Mace office for instruction. NCM contacted pt via phone, states his wife has been doing his exercises with him twice per day. States he has a follow up with Psychologist, sport and exercise on Thurs. States he had concerns about dressing. States gauze has some drainage but still intact. Instructed pt to follow instruction on his dc paperwork or follow up with his surgeon. Pt states he has call into the surgeon's office. States he aware they are not able to locate [facility] agency for [facility]. Jonnie Finner RN CCM Case Mgmt phone [phone]

## 2016-02-25 ENCOUNTER — Encounter: Payer: Self-pay | Admitting: *Deleted

## 2016-02-25 NOTE — Progress Notes (Deleted)
Cardiology Office Note   Date:  02/25/2016   ID:  Calvin Henry, DOB 20-Jan-1961, MRN [id number]  PCP:  Jana Half  Cardiologist:   Jenkins Rouge, MD   No chief complaint on file.     History of Present Illness: Calvin Henry is a 55 y.o. male who presents for dizziness and palpitations. History of HTN Seen by [facility] 02/25/16.  Onset 11/13 Dizziness worse with movement. Some Chest pain and dyspnea Apparently noted to have SVT Rx with adenosine R/O and d/c home for  Outpatient f/u Notes indicate ECG with SVT rat e180 no pre excitation. Post conversion SR/ST rate 110->80 Labs otherwise ok K 3.8 Cr 1.3 Hct 41.4  DC with addition of metoprolol to losartan   CXR NAD  Past Medical History:  Diagnosis Date  . Acid reflux   . Arthritis   . Chronic back pain   . H. pylori infection    treated with prevpac  . Hemorrhoids   . Hypertension   . Tubular adenoma     Past Surgical History:  Procedure Laterality Date  . COLONOSCOPY  08/07/10   friable anal canal otherwise normal  . COLONOSCOPY WITH ESOPHAGOGASTRODUODENOSCOPY (EGD) [name] 07/06/2013   Dr. Gala Romney- inflamed hemorrhoids- multiple colonic polyps= tubular adenoma. stomach bx= hpylori treated with prevpac  . ESOPHAGOGASTRODUODENOSCOPY  08/07/10   schatzkis ring otherwise normal, s/p 56-F dilation, small hiatal hernia.chronic active gastritis on bx  . HEMORRHOID BANDING    . HERNIA REPAIR    . NO PAST SURGERIES    . TOTAL HIP ARTHROPLASTY Left 10/04/2015   Procedure: LEFT TOTAL HIP ARTHROPLASTY ANTERIOR APPROACH;  Surgeon: Mcarthur Rossetti, MD;  [facility]: [facility];  Service: Orthopedics;  Laterality: Left;  . UMBILICAL HERNIA REPAIR  08/05/2011   Procedure: HERNIA REPAIR UMBILICAL ADULT;  Surgeon: Jamesetta So, MD;  Location: [facility];  Service: General;  Laterality: [name];     Current Outpatient Prescriptions  Medication Sig Dispense Refill  . amLODipine-valsartan (EXFORGE) 10-320  MG tablet Take 1 tablet by mouth daily.    Marland Kitchen aspirin EC 325 MG EC tablet Take 1 tablet (325 mg total) by mouth 2 (two) times daily after a meal. 30 tablet 0  . diclofenac (VOLTAREN) 75 MG EC tablet Take 75 mg by mouth 2 (two) times daily as needed (For arthritis or pain.).   1  . HYDROmorphone (DILAUDID) 2 MG tablet Take 1 tablet (2 mg total) by mouth every 4 (four) hours as needed for severe pain. 60 tablet 0  . methocarbamol (ROBAXIN) 500 MG tablet Take 1 tablet (500 mg total) by mouth every 6 (six) hours as needed for muscle spasms. 60 tablet 0  . Omega-3 Fatty Acids (FISH OIL PO) Take 1 capsule by mouth daily.     Marland Kitchen omeprazole (PRILOSEC) 20 MG capsule Take 20 mg by mouth daily as needed (For heartburn or acid reflux.).     Marland Kitchen simvastatin (ZOCOR) 10 MG tablet Take 10 mg by mouth daily.     No current facility-administered medications for this visit.     Allergies:   Patient has no known allergies.    Social History:  The patient  reports that he quit smoking about 29 years ago. His smoking use included Cigarettes. He has a 5.00 pack-year smoking history. He has never used smokeless tobacco. He reports that he drinks alcohol. He reports that he does not use drugs.   Family History:  The patient's family history includes  Diabetes in his mother; Hypertension in his mother.    ROS:  Please see the history of present illness.   Otherwise, review of systems are positive for {NONE DEFAULTED:18576::"none"}.   All other systems are reviewed and negative.    PHYSICAL EXAM: VS:  There were no vitals taken for this visit. , BMI There is no height or weight on file to calculate BMI. Affect appropriate Healthy:  appears stated age 25: normal Neck supple with no adenopathy JVP normal no bruits no thyromegaly Lungs clear with no wheezing and good diaphragmatic motion Heart:  S1/S2 no murmur, no rub, gallop or click PMI normal Abdomen: benighn, BS positve, no tenderness, no AAA no bruit.  No  HSM or HJR Distal pulses intact with no bruits No edema Neuro non-focal Skin warm and dry No muscular weakness    EKG:   ECG 02/24/16  SVT rate 190   Recent Labs: 10/05/2015: BUN 17; Creatinine, Ser 1.21; Hemoglobin 11.3; Platelets 281; Potassium 4.4; Sodium 139    Lipid Panel No results found for: CHOL, TRIG, HDL, CHOLHDL, VLDL, LDLCALC, LDLDIRECT    Wt Readings from Last 3 Encounters:  10/04/15 111.1 kg (245 lb)  09/25/15 111.1 kg (245 lb)  08/26/15 109.8 kg (242 lb)      Other studies Reviewed: Additional studies/ records that were reviewed today include: ***.    ASSESSMENT AND PLAN:  1.  ***   Current medicines are reviewed at length with the patient today.  The patient {ACTIONS; HAS/DOES NOT HAVE:19233} concerns regarding medicines.  The following changes have been made:  {PLAN; NO CHANGE:13088:s}  Labs/ tests ordered today include: *** No orders of the defined types were placed in this encounter.    Disposition:   FU with ***     Signed, Jenkins Rouge, MD  02/25/2016 1:23 PM    [facility] [place], [place], [place]  [address] Phone: [phone]; Fax: [phone]

## 2016-02-26 ENCOUNTER — Ambulatory Visit: Payer: Managed Care, Other (non HMO) | Admitting: Cardiovascular Disease

## 2016-03-12 ENCOUNTER — Encounter: Payer: Self-pay | Admitting: Cardiology

## 2016-03-12 NOTE — Progress Notes (Deleted)
Cardiology Office Note  Date: 03/12/2016   ID: Calvin Henry, DOB 06/25/1960, MRN [id number]  PCP: Jana Half  Referring provider: Gretchen Portela, MD Consulting Cardiologist: Rozann Lesches, MD   No chief complaint on file.   History of Present Illness: Calvin Henry is a 55 y.o. male presenting for cardiology consultation. He was seen recently at [facility] ER with an episode of symptomatic SVT, converted to sinus rhythm with IV adenosine 6 mg bolus. Troponin T levels were negative. I do not have the actual telemetry strips for review. Records indicate that his heart rate was in the 180s.   Records indicate left total hip replacement back in June due to avascular necrosis.  Past Medical History:  Diagnosis Date  . Acid reflux   . Arthritis   . Chronic back pain   . Essential hypertension   . H. pylori infection    Treated with Prevpac  . Hemorrhoids   . Palpitations   . Tubular adenoma     Past Surgical History:  Procedure Laterality Date  . COLONOSCOPY  08/07/10   friable anal canal otherwise normal  . COLONOSCOPY WITH ESOPHAGOGASTRODUODENOSCOPY (EGD) [name] 07/06/2013   Dr. Gala Romney- inflamed hemorrhoids- multiple colonic polyps= tubular adenoma. stomach bx= hpylori treated with prevpac  . ESOPHAGOGASTRODUODENOSCOPY  08/07/10   schatzkis ring otherwise normal, s/p 56-F dilation, small hiatal hernia.chronic active gastritis on bx  . HEMORRHOID BANDING    . HERNIA REPAIR    . NO PAST SURGERIES    . TOTAL HIP ARTHROPLASTY Left 10/04/2015   Procedure: LEFT TOTAL HIP ARTHROPLASTY ANTERIOR APPROACH;  Surgeon: Mcarthur Rossetti, MD;  [facility];  Service: Orthopedics;  Laterality: Left;  . UMBILICAL HERNIA REPAIR  08/05/2011   Procedure: HERNIA REPAIR UMBILICAL ADULT;  Surgeon: Jamesetta So, MD;  Location: [facility];  Service: General;  Laterality: [name];    Current Outpatient Prescriptions  Medication Sig Dispense Refill  . amLODipine-valsartan  (EXFORGE) 10-320 MG tablet Take 1 tablet by mouth daily.    Marland Kitchen aspirin EC 325 MG EC tablet Take 1 tablet (325 mg total) by mouth 2 (two) times daily after a meal. 30 tablet 0  . diclofenac (VOLTAREN) 75 MG EC tablet Take 75 mg by mouth 2 (two) times daily as needed (For arthritis or pain.).   1  . HYDROmorphone (DILAUDID) 2 MG tablet Take 1 tablet (2 mg total) by mouth every 4 (four) hours as needed for severe pain. 60 tablet 0  . methocarbamol (ROBAXIN) 500 MG tablet Take 1 tablet (500 mg total) by mouth every 6 (six) hours as needed for muscle spasms. 60 tablet 0  . Omega-3 Fatty Acids (FISH OIL PO) Take 1 capsule by mouth daily.     Marland Kitchen omeprazole (PRILOSEC) 20 MG capsule Take 20 mg by mouth daily as needed (For heartburn or acid reflux.).     Marland Kitchen simvastatin (ZOCOR) 10 MG tablet Take 10 mg by mouth daily.     No current facility-administered medications for this visit.    Allergies:  Patient has no known allergies.   Social History: The patient  reports that he quit smoking about 29 years ago. His smoking use included Cigarettes. He has a 5.00 pack-year smoking history. He has never used smokeless tobacco. He reports that he drinks alcohol. He reports that he does not use drugs.   Family History: The patient's family history includes Diabetes in his mother; Hypertension in his mother.   ROS:  Please see the  history of present illness. Otherwise, complete review of systems is positive for {NONE DEFAULTED:18576::"none"}.  All other systems are reviewed and negative.   Physical Exam: VS:  There were no vitals taken for this visit., BMI There is no height or weight on file to calculate BMI.  Wt Readings from Last 3 Encounters:  10/04/15 245 lb (111.1 kg)  09/25/15 245 lb (111.1 kg)  08/26/15 242 lb (109.8 kg)    General: Patient appears comfortable at rest. HEENT: Conjunctiva and lids normal, oropharynx clear with moist mucosa. Neck: Supple, no elevated JVP or carotid bruits, no  thyromegaly. Lungs: Clear to auscultation, nonlabored breathing at rest. Cardiac: Regular rate and rhythm, no S3 or significant systolic murmur, no pericardial rub. Abdomen: Soft, nontender, no hepatomegaly, bowel sounds present, no guarding or rebound. Extremities: No pitting edema, distal pulses 2+. Skin: Warm and dry. Musculoskeletal: No kyphosis. Neuropsychiatric: Alert and oriented x3, affect grossly appropriate.  ECG: I personally reviewed the tracing from 09/25/2015 which showed normal sinus rhythm.  Recent Labwork: 10/05/2015: BUN 17; Creatinine, Ser 1.21; Hemoglobin 11.3; Platelets 281; Potassium 4.4; Sodium 139  November 2017: Potassium 3.8, BUN 9, creatinine 1.3, AST 25, ALT 18, troponin T less than 0.01, hemoglobin 13.8, white lids to 98  Other Studies Reviewed Today:   Assessment and Plan:   Current medicines were reviewed with the patient today.  No orders of the defined types were placed in this encounter.   Disposition:  Signed, Satira Sark, MD, [facility] 03/12/2016 8:35 AM    [place] [facility] [place]. [address] [place], [place] [address] Phone: [phone]; Fax: [phone]

## 2016-03-13 ENCOUNTER — Ambulatory Visit: Payer: Managed Care, Other (non HMO) | Admitting: Cardiology

## 2016-04-20 NOTE — Progress Notes (Signed)
Cardiology Office Note  Date: 04/21/2016   ID: Calvin Henry, DOB Jul 25, 1960, MRN [id number]  PCP: Jana Half  Consulting Cardiologist: Rozann Lesches, MD   Chief Complaint  Patient presents with  . PSVT    History of Present Illness: Calvin Henry is a 56 y.o. male referred for cardiology consultation by Ms. Glennon Mac PA-C. Records indicate ER visit at [facility] back in November 2017 with an episode of SVT that required adenosine for restoration of sinus rhythm. No prior history of SVT, although he did have prior follow-up with the [facility] cardiology practice, saw Dr. Hamilton Capri back in 2015 for palpitations and documented PACs. Interestingly, he tells me that he has been experiencing intermittent palpitations his whole life, never had arrhythmia captured until November 2017. He does not have any associated chest pain or syncope, feels diaphoretic with the events. He had an episode last week that was less intense.  It looks like he was prescribed metoprolol after his ER visit in November 2017, however he has not been on the medication. It is likely that he has a reentrant circuit AVRT or AVNRT and we discussed options of medical therapy versus potential ablation if needed.  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  Past Medical History:  Diagnosis Date  . Acid reflux   . Arthritis   . Chronic back pain   . Essential hypertension   . H. pylori infection    Treated with Prevpac  . Hemorrhoids   . Palpitations   . Tubular adenoma     Past Surgical History:  Procedure Laterality Date  . COLONOSCOPY  08/07/10   friable anal canal otherwise normal  . COLONOSCOPY WITH ESOPHAGOGASTRODUODENOSCOPY (EGD) [name] 07/06/2013   Dr. Gala Romney- inflamed hemorrhoids- multiple colonic polyps= tubular adenoma. stomach bx= hpylori treated with prevpac  . ESOPHAGOGASTRODUODENOSCOPY  08/07/10   schatzkis ring otherwise normal,  s/p 56-F dilation, small hiatal hernia.chronic active gastritis on bx  . HEMORRHOID BANDING    . HERNIA REPAIR    . NO PAST SURGERIES    . TOTAL HIP ARTHROPLASTY Left 10/04/2015   Procedure: LEFT TOTAL HIP ARTHROPLASTY ANTERIOR APPROACH;  Surgeon: Mcarthur Rossetti, MD;  Location: [facility];  Service: Orthopedics;  Laterality: Left;  . UMBILICAL HERNIA REPAIR  08/05/2011   Procedure: HERNIA REPAIR UMBILICAL ADULT;  Surgeon: Jamesetta So, MD;  Location: [facility];  Service: General;  Laterality: [name];    Current Outpatient Prescriptions  Medication Sig Dispense Refill  . amLODipine-valsartan (EXFORGE) 10-320 MG tablet Take 1 tablet by mouth daily.    Marland Kitchen aspirin EC 325 MG EC tablet Take 1 tablet (325 mg total) by mouth 2 (two) times daily after a meal. 30 tablet 0  . atorvastatin (LIPITOR) 20 MG tablet Take 20 mg by mouth daily.    . diclofenac (VOLTAREN) 75 MG EC tablet Take 75 mg by mouth 2 (two) times daily as needed (For arthritis or pain.).   1  . methocarbamol (ROBAXIN) 500 MG tablet Take 1 tablet (500 mg total) by mouth every 6 (six) hours as needed for muscle spasms. 60 tablet 0  . omeprazole (PRILOSEC) 20 MG capsule Take 20 mg by mouth daily as needed (For heartburn or acid reflux.).     Marland Kitchen metoprolol succinate (TOPROL XL) 25 MG 24 hr tablet Take 1 tablet (25 mg total) by mouth daily. 90 tablet 3   No current facility-administered medications for this visit.  Allergies:  Patient has no known allergies.   Social History: The patient  reports that he quit smoking about 29 years ago. His smoking use included Cigarettes. He has a 5.00 pack-year smoking history. He has never used smokeless tobacco. He reports that he drinks alcohol. He reports that he does not use drugs.   Family History: The patient's family history includes Diabetes in his mother; Hypertension in his mother.   ROS:  Please see the history of present illness. Otherwise, complete review of systems is positive for chronic  back and hip pain.  All other systems are reviewed and negative.   Physical Exam: VS:  BP (!) 144/82   Pulse 93   Ht 5\' 6"  (1.676 m)   Wt 230 lb (104.3 kg)   SpO2 96%   BMI 37.12 kg/m , BMI Body mass index is 37.12 kg/m.  Wt Readings from Last 3 Encounters:  04/21/16 230 lb (104.3 kg)  10/04/15 245 lb (111.1 kg)  09/25/15 245 lb (111.1 kg)    General: Obese male, appears comfortable at rest. HEENT: Conjunctiva and lids normal, oropharynx clear. Neck: Supple, no elevated JVP or carotid bruits, no thyromegaly. Lungs: Clear to auscultation, nonlabored breathing at rest. Cardiac: Regular rate and rhythm, no S3 or significant systolic murmur, no pericardial rub. Abdomen: Soft, nontender, bowel sounds present, no guarding or rebound. Extremities: No pitting edema, distal pulses 2+. Skin: Warm and dry. Musculoskeletal: No kyphosis. Neuropsychiatric: Alert and oriented x3, affect grossly appropriate.  ECG: I personally reviewed the tracing from 02/25/2016 which showed SVT at 190 bpm, nonspecific ST changes.  Recent Labwork: 10/05/2015: BUN 17; Creatinine, Ser 1.21; Hemoglobin 11.3; Platelets 281; Potassium 4.4; Sodium 139  November 2017: Potassium 3.8, BUN 9, creatinine 1.3, AST 25, ALT 18, troponin T less than 0.012, hemoglobin 13.8, platelets 298  Other Studies Reviewed Today:  Chest x-ray 02/25/2016 [facility]): Heart size and mediastinal contours normal limits, no active disease.  Assessment and Plan:  1. PSVT, episode documented at [facility] ER visit in November 2017. Likely a reentrant mechanism AVRT or AVNRT. Plan to obtain an echocardiogram for cardiac structural assessment and start him on Toprol-XL beginning at 25 mg daily. This can be up titrated depending on symptom control, and if not effective would refer him to EP to discuss potential for ablation.  2. Essential hypertension, also on Exforge, he ran out of that recently.  3. Tobacco abuse in remission.  4. History  of hyperlipidemia, on Lipitor.  Current medicines were reviewed with the patient today.   Orders Placed This Encounter  Procedures  . ECHOCARDIOGRAM COMPLETE    Disposition: Follow-up in 6 months.  Signed, Satira Sark, MD, [facility] 04/21/2016 10:57 AM    [place] [facility] [place]. [address] [place], [place] [address] Phone: [phone]; Fax: [phone]

## 2016-04-21 ENCOUNTER — Encounter: Payer: Self-pay | Admitting: Cardiology

## 2016-04-21 ENCOUNTER — Ambulatory Visit (INDEPENDENT_AMBULATORY_CARE_PROVIDER_SITE_OTHER): Payer: Managed Care, Other (non HMO) | Admitting: Cardiology

## 2016-04-21 VITALS — BP 144/82 | HR 93 | Ht 66.0 in | Wt 230.0 lb

## 2016-04-21 DIAGNOSIS — E782 Mixed hyperlipidemia: Secondary | ICD-10-CM

## 2016-04-21 DIAGNOSIS — I471 Supraventricular tachycardia, unspecified: Secondary | ICD-10-CM

## 2016-04-21 DIAGNOSIS — F17201 Nicotine dependence, unspecified, in remission: Secondary | ICD-10-CM | POA: Diagnosis not present

## 2016-04-21 DIAGNOSIS — I10 Essential (primary) hypertension: Secondary | ICD-10-CM

## 2016-04-21 MED ORDER — METOPROLOL SUCCINATE ER 25 MG PO TB24: 25.0000 mg | ORAL_TABLET | Freq: Every day | ORAL | 3 refills | Status: DC

## 2016-04-21 NOTE — Patient Instructions (Addendum)
Your physician wants you to follow-up in: [place] will receive a reminder letter in the mail two months in advance. If you don't receive a letter, please call our office to schedule the follow-up appointment.   START Toprol XL 25 mg daily   Your physician has requested that you have an echocardiogram. Echocardiography is a painless test that uses sound waves to create images of your heart. It provides your doctor with information about the size and shape of your heart and how well your heart's chambers and valves are working. This procedure takes approximately one hour. There are no restrictions for this procedure.      Thank you for choosing [facility] !

## 2016-04-24 ENCOUNTER — Ambulatory Visit (HOSPITAL_COMMUNITY)
Admission: RE | Admit: 2016-04-24 | Discharge: 2016-04-24 | Disposition: A | Discharge status: Home / Self Care | Payer: Managed Care, Other (non HMO) | Source: Ambulatory Visit | Attending: Cardiovascular Disease | Admitting: Cardiovascular Disease

## 2016-04-24 DIAGNOSIS — I517 Cardiomegaly: Secondary | ICD-10-CM | POA: Insufficient documentation

## 2016-04-24 DIAGNOSIS — I471 Supraventricular tachycardia, unspecified: Secondary | ICD-10-CM

## 2016-04-24 NOTE — Progress Notes (Signed)
*  PRELIMINARY RESULTS* Echocardiogram 2D Echocardiogram has been performed.  Leavy Cella 04/24/2016, 1:49 PM

## 2016-05-21 ENCOUNTER — Encounter: Payer: Self-pay | Admitting: Internal Medicine

## 2016-06-16 ENCOUNTER — Ambulatory Visit (INDEPENDENT_AMBULATORY_CARE_PROVIDER_SITE_OTHER): Payer: Managed Care, Other (non HMO) | Admitting: Physician Assistant

## 2016-06-16 ENCOUNTER — Ambulatory Visit (INDEPENDENT_AMBULATORY_CARE_PROVIDER_SITE_OTHER): Payer: Self-pay

## 2016-06-16 ENCOUNTER — Encounter (INDEPENDENT_AMBULATORY_CARE_PROVIDER_SITE_OTHER): Payer: Self-pay | Admitting: Orthopaedic Surgery

## 2016-06-16 ENCOUNTER — Encounter (INDEPENDENT_AMBULATORY_CARE_PROVIDER_SITE_OTHER): Payer: Self-pay

## 2016-06-16 DIAGNOSIS — Z96642 Presence of left artificial hip joint: Secondary | ICD-10-CM | POA: Diagnosis not present

## 2016-06-16 DIAGNOSIS — M1612 Unilateral primary osteoarthritis, left hip: Secondary | ICD-10-CM | POA: Diagnosis not present

## 2016-06-16 NOTE — Progress Notes (Signed)
Office Visit Note   Patient: Calvin Henry           Date of Birth: 11/11/1960           MRN: [id number] Visit Date: 06/16/2016              Requested by: [name], PA-C [address] [place], [place] [address] PCP: Delman Cheadle, PA-C   Assessment & Plan: Visit Diagnoses:  1. Presence of left artificial hip joint     Plan: Follow up as needed if he has any questions or concerns he'll return.She is encouraged and answered by Dr. Ninfa Linden myself.  Follow-Up Instructions: Return if symptoms worsen or fail to improve.   Orders:  Orders Placed This Encounter  Procedures  . XR Pelvis 1-2 Views   No orders of the defined types were placed in this encounter.     Procedures: No procedures performed   Clinical Data: No additional findings.   Subjective: Chief Complaint  Patient presents with  . Left Hip - Follow-up    HPI Mr. Seide returns now 8 months status post left total hip arthroplasty. He states the left hip overall is doing well he has no complaints. He denies any right hip pain.  Review of Systems   Objective: Vital Signs: There were no vitals taken for this visit.  Physical Exam  Constitutional: He is oriented to person, place, and time. He appears well-developed and well-nourished. No distress.  Neurological: He is alert and oriented to person, place, and time.  Psychiatric: He has a normal mood and affect. His behavior is normal.    Ortho Exam Left hip good range of motion of the hip without pain. Left calf supple nontender. Right hip overall good range of motion without any discomfort. Specialty Comments:  No specialty comments available.  Imaging: Xr Pelvis 1-2 Views  Result Date: 06/16/2016 [facility] pelvis: F total hip arthroplasty no evidence of loosening no acute fracture. Heterotopic bands visible. Right hip with mild to moderate arthritic changes.Marland Kitchen    PMFS History: Patient Active Problem List   Diagnosis Date Noted    . Avascular necrosis of bone of left hip ([place]) 10/04/2015  . Status post total replacement of left hip 10/04/2015  . Abnormal weight loss 06/21/2013  . Early satiety 06/21/2013  . Rectal bleeding 06/16/2011  . GERD 06/30/2010  . RECTAL BLEEDING 06/30/2010  . OTHER DYSPHAGIA 06/30/2010   Past Medical History:  Diagnosis Date  . Acid reflux   . Arthritis   . Chronic back pain   . Essential hypertension   . H. pylori infection    Treated with Prevpac  . Hemorrhoids   . Palpitations   . Tubular adenoma     Family History  Problem Relation Age of Onset  . Diabetes Mother   . Hypertension Mother     Past Surgical History:  Procedure Laterality Date  . COLONOSCOPY  08/07/10   friable anal canal otherwise normal  . COLONOSCOPY WITH ESOPHAGOGASTRODUODENOSCOPY (EGD) [name] 07/06/2013   Dr. Gala Romney- inflamed hemorrhoids- multiple colonic polyps= tubular adenoma. stomach bx= hpylori treated with prevpac  . ESOPHAGOGASTRODUODENOSCOPY  08/07/10   schatzkis ring otherwise normal, s/p 56-F dilation, small hiatal hernia.chronic active gastritis on bx  . HEMORRHOID BANDING    . HERNIA REPAIR    . NO PAST SURGERIES    . TOTAL HIP ARTHROPLASTY Left 10/04/2015   Procedure: LEFT TOTAL HIP ARTHROPLASTY ANTERIOR APPROACH;  Surgeon: Mcarthur Rossetti, MD;  Location: [facility];  Service: Orthopedics;  Laterality: Left;  . UMBILICAL HERNIA REPAIR  08/05/2011   Procedure: HERNIA REPAIR UMBILICAL ADULT;  Surgeon: Jamesetta So, MD;  Location: [facility];  Service: General;  Laterality: [name];   Social History   Occupational History  . Not on file.   Social History Main Topics  . Smoking status: Former Smoker    Packs/day: 0.50    Years: 10.00    Types: Cigarettes    Quit date: 12/01/1986  . Smokeless tobacco: Never Used     Comment: Quit x 20 plus years  . Alcohol use 0.0 oz/week     Comment: Regularly  . Drug use: No     Comment: remote marijuana use   . Sexual activity: Yes

## 2016-08-08 IMAGING — RF DG HIP (WITH PELVIS) OPERATIVE*L*
1 series · 2 of 2 positions shown · non-contrast
Comparison: None.

CLINICAL DATA: Status post left hip replacement.

EXAM:
OPERATIVE LEFT HIP (WITH PELVIS IF PERFORMED) 2 VIEWS
TECHNIQUE: Fluoroscopic spot image(s) were submitted for interpretation
post-operatively.

[Series 1: run · 2 of 2 slices shown]
[im 1/2]
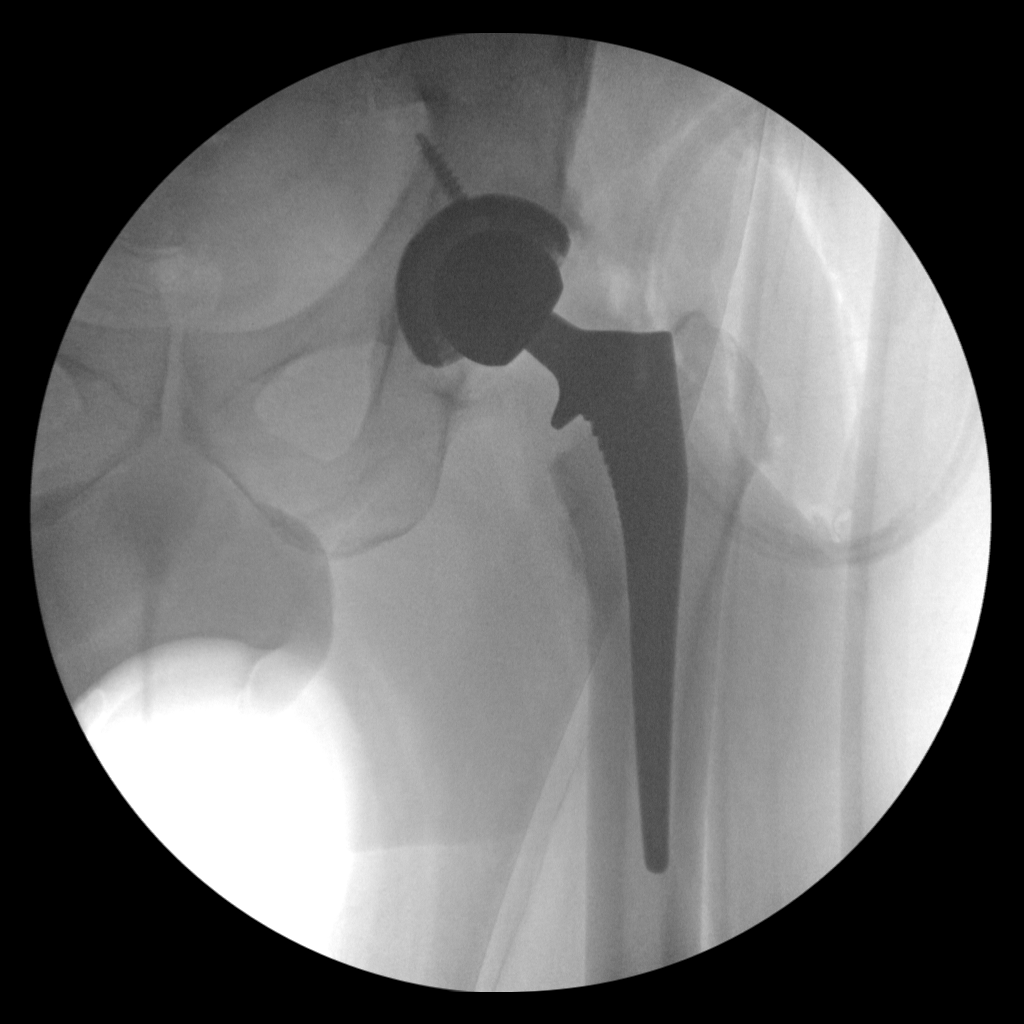
[im 2/2]
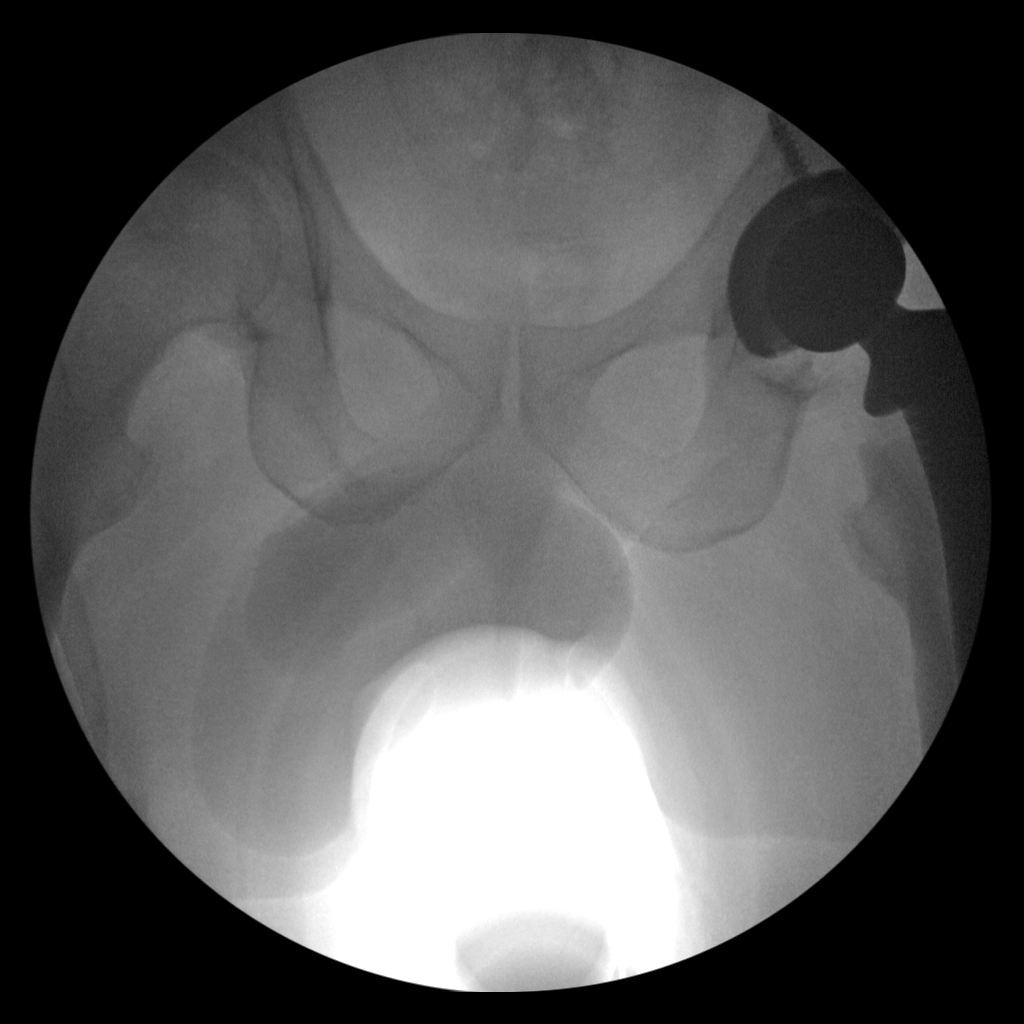

[2 of 2 positions shown; findings below may reference images not displayed]

FINDINGS: Bipolar left hip prosthesis is seen in expected position. No
evidence of fracture or dislocation.
IMPRESSION: Expected postoperative appearance of bipolar left hip prosthesis.

## 2016-10-02 ENCOUNTER — Encounter: Payer: Self-pay | Admitting: Internal Medicine

## 2016-10-08 ENCOUNTER — Encounter: Payer: Self-pay | Admitting: Cardiology

## 2016-10-08 NOTE — Progress Notes (Signed)
Cardiology Office Note  Date: 10/12/2016   ID: DACE DENN, DOB 11-Feb-1961, MRN [id number]  PCP: Scherrie Bateman  Primary Cardiologist: Rozann Lesches, MD   Chief Complaint  Patient presents with  . Hospitalization Follow-up    History of Present Illness: Calvin Henry is a 56 y.o. male that I met in January of this year. He is referred back to the office after interval evaluation at [facility]. He is here today with his wife. He was recently hospitalized for observation in mid June having presented with rapid palpitations. His history includes PSVT (documented by ECG from November 2017), although the initial information from the ER indicates atrial fibrillation. I do not have any ECGs for review, these have been requested. Patient states that he received some type of medication through the IV for treatment, no adjustments were made in his standing outpatient medications. He has had at least 2-3 episodes of prolonged palpitations since January on Toprol-XL 100 mg daily. He reports compliance with his medication. He has not had any syncope.  Past Medical History:  Diagnosis Date  . Acid reflux   . Arthritis   . Chronic back pain   . Essential hypertension   . H. pylori infection    Treated with Prevpac  . Hemorrhoids   . Palpitations   . PSVT (paroxysmal supraventricular tachycardia) ([place])   . Tubular adenoma     Past Surgical History:  Procedure Laterality Date  . COLONOSCOPY  08/07/10   friable anal canal otherwise normal  . COLONOSCOPY WITH ESOPHAGOGASTRODUODENOSCOPY (EGD) [name] 07/06/2013   Dr. Gala Romney- inflamed hemorrhoids- multiple colonic polyps= tubular adenoma. stomach bx= hpylori treated with prevpac  . ESOPHAGOGASTRODUODENOSCOPY  08/07/10   schatzkis ring otherwise normal, s/p 56-F dilation, small hiatal hernia.chronic active gastritis on bx  . HEMORRHOID BANDING    . HERNIA REPAIR    . NO PAST SURGERIES    . TOTAL HIP ARTHROPLASTY  Left 10/04/2015   Procedure: LEFT TOTAL HIP ARTHROPLASTY ANTERIOR APPROACH;  Surgeon: Mcarthur Rossetti, MD;  Location[facility];  Service: Orthopedics;  Laterality: Left;  . UMBILICAL HERNIA REPAIR  08/05/2011   Procedure: HERNIA REPAIR UMBILICAL ADULT;  Surgeon: Jamesetta So, MD;  Location: [facility];  Service: General;  Laterality: [name];    Current Outpatient Prescriptions  Medication Sig Dispense Refill  . aspirin EC 81 MG tablet Take 81 mg by mouth daily.    Marland Kitchen buPROPion (WELLBUTRIN XL) 150 MG 24 hr tablet Take 150 mg by mouth daily.    . hydrochlorothiazide (MICROZIDE) 12.5 MG capsule Take 12.5 mg by mouth daily.    . metoprolol succinate (TOPROL-XL) 100 MG 24 hr tablet Take 100 mg by mouth daily. Take with or immediately following a meal.    . oxyCODONE-acetaminophen (PERCOCET) 10-325 MG tablet Take 1 tablet by mouth 3 (three) times daily as needed.  0  . valsartan (DIOVAN) 160 MG tablet Take 160 mg by mouth daily.     No current facility-administered medications for this visit.    Allergies:  Patient has no known allergies.   Social History: The patient  reports that he quit smoking about 29 years ago. His smoking use included Cigarettes. He has a 5.00 pack-year smoking history. He has never used smokeless tobacco. He reports that he drinks alcohol. He reports that he does not use drugs.   ROS:  Please see the history of present illness. Otherwise, complete review of systems is positive for none.  All other systems are reviewed and negative.   Physical Exam: VS:  BP 122/88   Pulse 83   Ht _0  (1.676 m)   Wt 215 lb (97.5 kg)   SpO2 98%   BMI 34.70 kg/m , BMI Body mass index is 34.7 kg/m.  Wt Readings from Last 3 Encounters:  10/12/16 215 lb (97.5 kg)  04/21/16 230 lb (104.3 kg)  10/04/15 245 lb (111.1 kg)    General: Obese male, appears comfortable at rest. HEENT: Conjunctiva and lids normal, oropharynx clear. Neck: Supple, no elevated JVP or carotid bruits, no  thyromegaly. Lungs: Clear to auscultation, nonlabored breathing at rest. Cardiac: Regular rate and rhythm, no S3 or significant systolic murmur, no pericardial rub. Abdomen: Soft, nontender, bowel sounds present, no guarding or rebound. Extremities: No pitting edema, distal pulses 2+. Skin: Warm and dry. Musculoskeletal: No kyphosis. Neuropsychiatric: Alert and oriented x3, affect grossly appropriate.  ECG: I personally reviewed the tracing from 02/25/2016 which showed SVT at 190 bpm, nonspecific ST changes.  Recent Labwork:  November 2017: Potassium 3.8, BUN 9, creatinine 1.3, AST 25, ALT 18, troponin T less than 0.012, hemoglobin 13.8, platelets 298  Other Studies Reviewed Today:  Echocardiogram 04/24/2016: Study Conclusions  - Left ventricle: The cavity size was normal. Wall thickness was   increased in a pattern of moderate LVH. Systolic function was   normal. The estimated ejection fraction was in the range of 55%   to 60%. Wall motion was normal; there were no regional wall   motion abnormalities. Diastolic dysfunction, grade indeterminate. - Left atrium: The atrium was mildly to moderately dilated.  Assessment and Plan:  History of PSVT based on prior assessment. Patient now presents after recent hospital stay at [facility]. I am requesting his ECGs for review. Presumably he had recurrent PSVT, although there is mention made of atrial fibrillation in the ER records. For now will increase Toprol-XL to 150 mg daily, continue aspirin. Will review his ECGs and make further recommendations from there. Potential for EP consultation regarding ablation was discussed today.  Current medicines were reviewed with the patient today.  Disposition: Pending review of ECGs.  Signed, Satira Sark, MD, [facility] 10/12/2016 12:10 PM    [facility] [place], [place], [place] [address] Phone: [phone]; Fax: [phone]

## 2016-10-12 ENCOUNTER — Ambulatory Visit: Payer: Managed Care, Other (non HMO) | Admitting: Cardiology

## 2016-10-12 ENCOUNTER — Ambulatory Visit (INDEPENDENT_AMBULATORY_CARE_PROVIDER_SITE_OTHER): Payer: Self-pay | Admitting: Cardiology

## 2016-10-12 ENCOUNTER — Encounter: Payer: Self-pay | Admitting: Cardiology

## 2016-10-12 VITALS — BP 122/88 | HR 83 | Ht 66.0 in | Wt 215.0 lb

## 2016-10-12 DIAGNOSIS — I471 Supraventricular tachycardia, unspecified: Secondary | ICD-10-CM

## 2016-10-12 MED ORDER — METOPROLOL SUCCINATE ER 50 MG PO TB24: 150.0000 mg | ORAL_TABLET | Freq: Every day | ORAL | 3 refills | Status: DC

## 2016-10-12 NOTE — Patient Instructions (Signed)
Medication Instructions:  Your physician has recommended you make the following change in your medication:  Begin taking Toprol XL 150 mg daily Please continue all other medications as prescribed  Labwork: NONE  Testing/Procedures: NONE  Follow-Up: Your physician recommends that you schedule a follow-up appointment PENDING  Any Other Special Instructions Will Be Listed Below (If Applicable).  If you need a refill on your cardiac medications before your next appointment, please call your pharmacy.

## 2016-10-12 NOTE — Progress Notes (Signed)
Subsequently able to review available ECGs from recent stay at [facility]. Tracings from June 10 and June 11 both show sinus rhythm, nonspecific T-wave changes, borderline prolonged QT interval. Unfortunately, no telemetry strips or ECGs are available to document what was described as being atrial fibrillation per records. Based on my initial assessment of this patient back in January, he had SVT at that time (per ECG scanned into EPIC from November 2017), not atrial fibrillation. I suppose it is possible that he has had both arrhythmias. CHADSVASC score is 1. For now recommend continuing aspirin, we increased Toprol-XL to 150 mg daily today. Will go ahead and make a referral to EP for further evaluation.  Satira Sark, M.D., F.A.C.C.

## 2016-10-12 NOTE — Addendum Note (Signed)
Addended by: Acquanetta Chain on: 10/12/2016 01:28 PM   Modules accepted: Orders

## 2016-11-09 ENCOUNTER — Encounter: Payer: Self-pay | Admitting: Internal Medicine

## 2016-11-09 ENCOUNTER — Ambulatory Visit (INDEPENDENT_AMBULATORY_CARE_PROVIDER_SITE_OTHER): Payer: Self-pay | Admitting: Internal Medicine

## 2016-11-09 VITALS — BP 122/84 | HR 70 | Ht 66.0 in | Wt 215.2 lb

## 2016-11-09 DIAGNOSIS — I471 Supraventricular tachycardia, unspecified: Secondary | ICD-10-CM

## 2016-11-09 DIAGNOSIS — Z789 Other specified health status: Secondary | ICD-10-CM

## 2016-11-09 DIAGNOSIS — Z7289 Other problems related to lifestyle: Secondary | ICD-10-CM

## 2016-11-09 NOTE — Patient Instructions (Addendum)
Medication Instructions:  Your physician recommends that you continue on your current medications as directed. Please refer to the Current Medication list given to you today.   Labwork: none  Testing/Procedures: none  Follow-Up: Your physician recommends that you schedule a follow-up appointment in: 3 months with Dr. Rayann Heman, in [facility].   Any Other Special Instructions Will Be Listed Below (If Applicable).     If you need a refill on your cardiac medications before your next appointment, please call your pharmacy.

## 2016-11-09 NOTE — Progress Notes (Signed)
Electrophysiology Office Note   Date:  11/09/2016   ID:  Calvin Henry, DOB 07/16/1960, MRN [id number]  PCP:  [name], PA-C  Cardiologist:  Dr Domenic Polite Primary Electrophysiologist: [name], MD    Chief Complaint  Patient presents with  . New Patient (Initial Visit)    SVT     History of Present Illness: Calvin Henry is a 56 y.o. male who presents today for electrophysiology evaluation.   He is referred by Dr Domenic Polite for Ep consultation regarding tachycardia.  The patient has had abrupt onset/offset of tachypalpitations for years.  He reports receiving adenosine with termination previously.  He is unaware of triggers or precipitants.  He has not been effectively able to use vagal maneuvers.   He has been documented to have SVT (11/17 ekg reviewed).  More recently, he presented to [facility] and was told that he had afib, though this is not well documented (no strips to review)  Today, he denies symptoms of chest pain, shortness of breath, orthopnea, PND, lower extremity edema, claudication, dizziness, presyncope, syncope, bleeding, or neurologic sequela. The patient is tolerating medications without difficulties and is otherwise without complaint today.    Past Medical History:  Diagnosis Date  . Acid reflux   . Arthritis   . Chronic back pain   . Essential hypertension   . H. pylori infection    Treated with Prevpac  . Hemorrhoids   . PSVT (paroxysmal supraventricular tachycardia) ([place])   . Tubular adenoma    Past Surgical History:  Procedure Laterality Date  . COLONOSCOPY  08/07/10   friable anal canal otherwise normal  . COLONOSCOPY WITH ESOPHAGOGASTRODUODENOSCOPY (EGD) [name] 07/06/2013   Dr. Gala Romney- inflamed hemorrhoids- multiple colonic polyps= tubular adenoma. stomach bx= hpylori treated with prevpac  . ESOPHAGOGASTRODUODENOSCOPY  08/07/10   schatzkis ring otherwise normal, s/p 56-F dilation, small hiatal hernia.chronic active gastritis on bx  .  HEMORRHOID BANDING    . HERNIA REPAIR    . NO PAST SURGERIES    . TOTAL HIP ARTHROPLASTY Left 10/04/2015   Procedure: LEFT TOTAL HIP ARTHROPLASTY ANTERIOR APPROACH;  Surgeon: Mcarthur Rossetti, MD;  Location: [facility];  Service: Orthopedics;  Laterality: Left;  . UMBILICAL HERNIA REPAIR  08/05/2011   Procedure: HERNIA REPAIR UMBILICAL ADULT;  Surgeon: Jamesetta So, MD;  Location: [facility];  Service: General;  Laterality: [name];     Current Outpatient Prescriptions  Medication Sig Dispense Refill  . aspirin EC 81 MG tablet Take 81 mg by mouth daily.    . hydrochlorothiazide (MICROZIDE) 12.5 MG capsule Take 12.5 mg by mouth daily.    Marland Kitchen losartan (COZAAR) 100 MG tablet Take 100 mg by mouth daily.    . metoprolol succinate (TOPROL-XL) 50 MG 24 hr tablet Take 3 tablets (150 mg total) by mouth daily. Take with or immediately following a meal. 90 tablet 3   No current facility-administered medications for this visit.     Allergies:   Patient has no known allergies.   Social History:  The patient  reports that he quit smoking about 29 years ago. His smoking use included Cigarettes. He has a 5.00 pack-year smoking history. He has never used smokeless tobacco. He reports that he drinks alcohol. He reports that he does not use drugs.   Family History:  The patient's  family history includes Diabetes in his mother; Hypertension in his mother.    ROS:  Please see the history of present illness.   All other systems  are personally reviewed and negative.    PHYSICAL EXAM: VS:  BP 122/84   Pulse 70   Ht 5\' 6"  (1.676 m)   Wt 215 lb 3.2 oz (97.6 kg)   SpO2 96%   BMI 34.73 kg/m  , BMI Body mass index is 34.73 kg/m. GEN: Well nourished, well developed, in no acute distress  HEENT: normal  Neck: no JVD, carotid bruits, or masses Cardiac: RRR; no murmurs, rubs, or gallops,no edema  Respiratory:  clear to auscultation bilaterally, normal work of breathing GI: soft, nontender, nondistended, +  BS MS: no deformity or atrophy  Skin: warm and dry  Neuro:  Strength and sensation are intact Psych: euthymic mood, full affect  EKG:  EKG is ordered today. The ekg ordered today is personally reviewed and shows sinus rhythm 70 bpm, PR 154 msec, otherwise normal ekg  ekg 02/25/16 reviewed today reveals mid RP SVT   Recent Labs: No results found for requested labs within last 8760 hours.  personally reviewed   Lipid Panel  No results found for: CHOL, TRIG, HDL, CHOLHDL, VLDL, LDLCALC, LDLDIRECT personally reviewed   Wt Readings from Last 3 Encounters:  11/09/16 215 lb 3.2 oz (97.6 kg)  10/12/16 215 lb (97.5 kg)  04/21/16 230 lb (104.3 kg)      Other studies personally reviewed: Additional studies/ records that were reviewed today include: Dr Chuck Hint notes , echo 1/18 Review of the above records today demonstrates: as above   ASSESSMENT AND PLAN:  1.  SVT The patient has symptomatic recurrent adenosine senstive mid RP SVT.  Therapeutic strategies for supraventricular tachycardia including medicine and ablation were discussed in detail with the patient today. Risk, benefits, and alternatives to EP study and radiofrequency ablation were also discussed in detail today.  At this time, he feels that he has done better with recently increased metoprolol.  He wishes to continue his current strategy.  He is not ready for ablation.  2. ? afib Not well documented chads2vasc score is 1.  I agree with Dr Domenic Polite that without better documentation that we should follow conservatively.    3. ETOH Cessation advised  Follow-up:  Return to see me in 3 months  Current medicines are reviewed at length with the patient today.   The patient does not have concerns regarding his medicines.  The following changes were made today:  none  Labs/ tests ordered today include:  Orders Placed This Encounter  Procedures  . EKG 12-Lead     Signed, [name], MD  11/09/2016 2:56 PM      [place] [address] [phone] (office) [phone] (fax)

## 2016-11-26 ENCOUNTER — Ambulatory Visit: Payer: Managed Care, Other (non HMO) | Admitting: Nurse Practitioner

## 2016-11-26 ENCOUNTER — Telehealth: Payer: Self-pay | Admitting: Internal Medicine

## 2016-11-26 NOTE — Telephone Encounter (Signed)
Returned call to patient and he is wanting to go ahead and proceed with the ablation.  I let him know I would discuss with Dr Rayann Heman on Monday and call him back to set up time but to be looking at 12/11/16 at 7:30am

## 2016-11-26 NOTE — Telephone Encounter (Signed)
New message    Pt c/o medication issue:  1. Name of Medication: metoprolol   2. How are you currently taking this medication (dosage and times per day)? 50 mg  3. Are you having a reaction (difficulty breathing--STAT)?    4. What is your medication issue? Pt states the medication is not helping his problem.

## 2016-12-02 NOTE — Telephone Encounter (Signed)
Spoke with patient and will schedule for 12/15/16 at 11:30am.  Will have labs done at the hospital.    Please arrive at [facility] at 9:30am Do not eat or drink after midnight the night prior to the procedure Do not take any medications the morning of the test Plan for one night stay Will need someone to drive you home at discharge

## 2016-12-14 ENCOUNTER — Telehealth: Payer: Self-pay | Admitting: Cardiology

## 2016-12-14 NOTE — Telephone Encounter (Signed)
Pt called asking about his procedure tomorrow- I relayed the following to him-   Please arrive at [facility] at 9:30am Do not eat or drink after midnight the night prior to the procedure Do not take any medications the morning of the test Plan for one night stay Will need someone to drive you home at discharge  Kerin Ransom PA-C 12/14/2016 12:39 PM

## 2016-12-15 ENCOUNTER — Ambulatory Visit (HOSPITAL_COMMUNITY): Payer: Self-pay | Admitting: Certified Registered Nurse Anesthetist

## 2016-12-15 ENCOUNTER — Ambulatory Visit (HOSPITAL_COMMUNITY)
Admission: RE | Admit: 2016-12-15 | Discharge: 2016-12-15 | Disposition: A | Discharge status: Home / Self Care | Payer: Self-pay | Source: Ambulatory Visit | Attending: Internal Medicine | Admitting: Internal Medicine

## 2016-12-15 ENCOUNTER — Encounter: Payer: Self-pay | Admitting: Nurse Practitioner

## 2016-12-15 ENCOUNTER — Encounter (HOSPITAL_COMMUNITY)
Admission: RE | Disposition: A | Discharge status: Home / Self Care | Payer: Self-pay | Source: Ambulatory Visit | Attending: Internal Medicine

## 2016-12-15 DIAGNOSIS — Z6837 Body mass index (BMI) 37.0-37.9, adult: Secondary | ICD-10-CM | POA: Insufficient documentation

## 2016-12-15 DIAGNOSIS — Z7982 Long term (current) use of aspirin: Secondary | ICD-10-CM | POA: Insufficient documentation

## 2016-12-15 DIAGNOSIS — I471 Supraventricular tachycardia: Secondary | ICD-10-CM

## 2016-12-15 DIAGNOSIS — Z87891 Personal history of nicotine dependence: Secondary | ICD-10-CM | POA: Insufficient documentation

## 2016-12-15 DIAGNOSIS — G8929 Other chronic pain: Secondary | ICD-10-CM | POA: Insufficient documentation

## 2016-12-15 DIAGNOSIS — E669 Obesity, unspecified: Secondary | ICD-10-CM | POA: Insufficient documentation

## 2016-12-15 DIAGNOSIS — K219 Gastro-esophageal reflux disease without esophagitis: Secondary | ICD-10-CM | POA: Insufficient documentation

## 2016-12-15 DIAGNOSIS — M549 Dorsalgia, unspecified: Secondary | ICD-10-CM | POA: Insufficient documentation

## 2016-12-15 DIAGNOSIS — I10 Essential (primary) hypertension: Secondary | ICD-10-CM | POA: Insufficient documentation

## 2016-12-15 DIAGNOSIS — M1990 Unspecified osteoarthritis, unspecified site: Secondary | ICD-10-CM | POA: Insufficient documentation

## 2016-12-15 HISTORY — PX: SVT ABLATION: EP1225

## 2016-12-15 LAB — BASIC METABOLIC PANEL
Anion gap: 8 (ref 5–15)
BUN: 10 mg/dL (ref 6–20)
CO2: 23 mmol/L (ref 22–32)
Calcium: 9 mg/dL (ref 8.9–10.3)
Chloride: 113 mmol/L — ABNORMAL HIGH (ref 101–111)
Creatinine, Ser: 1.45 mg/dL — ABNORMAL HIGH (ref 0.61–1.24)
GFR calc Af Amer: >60 mL/min (ref >60)
GFR calc non Af Amer: 52 mL/min — ABNORMAL LOW (ref >60)
Glucose, Bld: 92 mg/dL (ref 65–99)
Potassium: 3.9 mmol/L (ref 3.5–5.1)
Sodium: 144 mmol/L (ref 135–145)

## 2016-12-15 LAB — CBC
HCT: 37.5 % — ABNORMAL LOW (ref 39.0–52.0)
Hemoglobin: 12.3 g/dL — ABNORMAL LOW (ref 13.0–17.0)
MCH: 30.8 pg (ref 26.0–34.0)
MCHC: 32.8 g/dL (ref 30.0–36.0)
MCV: 94 fL (ref 78.0–100.0)
Platelets: 244 10*3/uL (ref 150–400)
RBC: 3.99 MIL/uL — ABNORMAL LOW (ref 4.22–5.81)
RDW: 14.9 % (ref 11.5–15.5)
WBC: 5.1 10*3/uL (ref 4.0–10.5)

## 2016-12-15 LAB — PROTIME-INR
INR: 1
Prothrombin Time: 13.1 seconds (ref 11.4–15.2)

## 2016-12-15 SURGERY — SVT ABLATION
Anesthesia: Monitor Anesthesia Care

## 2016-12-15 MED ORDER — ISOPROTERENOL HCL 0.2 MG/ML IJ SOLN
INTRAMUSCULAR | Status: AC
  Filled 2016-12-15: qty 5

## 2016-12-15 MED ORDER — LIDOCAINE HCL (PF) 1 % IJ SOLN
INTRAMUSCULAR | Status: DC | PRN
  Administered 2016-12-15: 23 mL

## 2016-12-15 MED ORDER — LIDOCAINE HCL (PF) 1 % IJ SOLN
INTRAMUSCULAR | Status: AC
  Filled 2016-12-15: qty 30

## 2016-12-15 MED ORDER — SODIUM CHLORIDE 0.9% FLUSH: 3.0000 mL | Freq: Two times a day (BID) | INTRAVENOUS | Status: DC

## 2016-12-15 MED ORDER — FENTANYL CITRATE (PF) 100 MCG/2ML IJ SOLN
INTRAMUSCULAR | Status: DC | PRN
  Administered 2016-12-15 (×4): 50 ug via INTRAVENOUS

## 2016-12-15 MED ORDER — ISOPROTERENOL HCL 0.2 MG/ML IJ SOLN
INTRAMUSCULAR | Status: DC | PRN
  Administered 2016-12-15: 4 ug/min via INTRAVENOUS

## 2016-12-15 MED ORDER — MIDAZOLAM HCL 5 MG/5ML IJ SOLN
INTRAMUSCULAR | Status: DC | PRN
  Administered 2016-12-15: 2 mg via INTRAVENOUS

## 2016-12-15 MED ORDER — HYDROCODONE-ACETAMINOPHEN 5-325 MG PO TABS: 1.0000 | ORAL_TABLET | ORAL | Status: DC | PRN

## 2016-12-15 MED ORDER — SODIUM CHLORIDE 0.9 % IV SOLN
INTRAVENOUS | Status: DC
  Administered 2016-12-15: 10:00:00 via INTRAVENOUS

## 2016-12-15 MED ORDER — ONDANSETRON HCL 4 MG/2ML IJ SOLN
INTRAMUSCULAR | Status: DC | PRN
  Administered 2016-12-15: 4 mg via INTRAVENOUS

## 2016-12-15 MED ORDER — SODIUM CHLORIDE 0.9 % IV SOLN: 250.0000 mL | INTRAVENOUS | Status: DC | PRN

## 2016-12-15 MED ORDER — SODIUM CHLORIDE 0.9% FLUSH: 3.0000 mL | INTRAVENOUS | Status: DC | PRN

## 2016-12-15 MED ORDER — METOPROLOL SUCCINATE ER 50 MG PO TB24: 50.0000 mg | ORAL_TABLET | Freq: Every day | ORAL | 3 refills | Status: DC

## 2016-12-15 SURGICAL SUPPLY — 12 items
BAG SNAP BAND KOVER 36X36 (MISCELLANEOUS) ×2 IMPLANT
BLANKET WARM UNDERBOD FULL ACC (MISCELLANEOUS) ×2 IMPLANT
CATH EZ STEER NAV 4MM D-F CUR (ABLATOR) ×1 IMPLANT
CATH JOSEPHSON QUAD-ALLRED 6FR (CATHETERS) ×2 IMPLANT
CATH WEBSTER BI DIR CS D-F CRV (CATHETERS) ×1 IMPLANT
PACK EP LATEX FREE (CUSTOM PROCEDURE TRAY) ×2
PACK EP LF (CUSTOM PROCEDURE TRAY) ×1 IMPLANT
PAD DEFIB LIFELINK (PAD) ×2 IMPLANT
PATCH CARTO3 (PAD) ×1 IMPLANT
SHEATH PINNACLE 6F 10CM (SHEATH) ×1 IMPLANT
SHEATH PINNACLE 7F 10CM (SHEATH) ×1 IMPLANT
SHEATH PINNACLE 8F 10CM (SHEATH) ×1 IMPLANT

## 2016-12-15 NOTE — Progress Notes (Signed)
Doing well s/p SVT ablation Groin without hematoma/ bruit No concerns VSS  DC to home Reduce Toprol to 50mg  daily Follow-up with me in 4 weeks May return to work on Monday  [name] MD, [facility] 12/15/2016 4:50 PM

## 2016-12-15 NOTE — Anesthesia Preprocedure Evaluation (Signed)
Anesthesia Evaluation  Patient identified by MRN, date of birth, ID band Patient awake    Reviewed: Allergy & Precautions, NPO status , Patient's Chart, lab work & pertinent test results  History of Anesthesia Complications Negative for: history of anesthetic complications  Airway Mallampati: III  TM Distance: >3 FB Neck ROM: Full    Dental  (+) Partial Upper, Partial Lower   Pulmonary former smoker,    Pulmonary exam normal breath sounds clear to auscultation       Cardiovascular hypertension, Pt. on medications Normal cardiovascular exam+ dysrhythmias  Rhythm:Regular Rate:Normal     Neuro/Psych negative neurological ROS  negative psych ROS   GI/Hepatic Neg liver ROS, GERD  Medicated and Controlled,  Endo/Other  Morbid obesityHyperlipidemia  Renal/GU negative Renal ROS  negative genitourinary   Musculoskeletal  (+) Arthritis , AVN left hip Chronic back pain   Abdominal (+) + obese,   Peds  Hematology   Anesthesia Other Findings   Reproductive/Obstetrics                             Anesthesia Physical Anesthesia Plan  ASA: III  Anesthesia Plan: MAC   Post-op Pain Management:    Induction:   PONV Risk Score and Plan: 1 and Ondansetron  Airway Management Planned: Nasal Cannula  Additional Equipment: None  Intra-op Plan:   Post-operative Plan:   Informed Consent: I have reviewed the patients History and Physical, chart, labs and discussed the procedure including the risks, benefits and alternatives for the proposed anesthesia with the patient or authorized representative who has indicated his/her understanding and acceptance.   Dental advisory given  Plan Discussed with: CRNA and Surgeon  Anesthesia Plan Comments:         Anesthesia Quick Evaluation

## 2016-12-15 NOTE — Transfer of Care (Signed)
Immediate Anesthesia Transfer of Care Note  Patient: Calvin Henry  Procedure(s) Performed: Procedure(s): SVT Ablation ([name])  Patient Location: Cath Lab  Anesthesia Type: MAC  Level of Consciousness: awake, alert , oriented and patient cooperative  Airway & Oxygen Therapy: Patient Spontanous Breathing and Patient connected to face mask oxygen  Post-op Assessment: Report given to RN and Post -op Vital signs reviewed and stable  Post vital signs: Reviewed and stable  Last Vitals:  Vitals:   12/15/16 0922  BP: (!) 144/90  Pulse: 75  Resp: 18  Temp: 36.7 C  SpO2: 100%    Last Pain:  Vitals:   12/15/16 0922  TempSrc: Oral      Patients Stated Pain Goal: 3 (42/35/36 1443)  Complications: No apparent anesthesia complications

## 2016-12-15 NOTE — Discharge Instructions (Signed)
No driving for 4 days. No lifting over 5 lbs for 1 week. No sexual activity for 1 week. You may return to work in 1 week. Keep procedure site clean & dry. If you notice increased pain, swelling, bleeding or pus, call/return!  You may shower, but no soaking baths/hot tubs/pools for 1 week.  ° ° °

## 2016-12-15 NOTE — H&P (Signed)
PCP:  [name], PA-C     Cardiologist:  Dr Domenic Polite Primary Electrophysiologist: [name], MD           Chief Complaint  Patient presents with  . New Patient (Initial Visit)    SVT     History of Present Illness: Calvin Henry is a 56 y.o. male who presents today for EPS and ablation for SVT.   He is referred by Dr Domenic Polite for Ep consultation regarding tachycardia.  The patient has had abrupt onset/offset of tachypalpitations for years.  He reports receiving adenosine with termination previously.  He is unaware of triggers or precipitants.  He has not been effectively able to use vagal maneuvers.   He has been documented to have SVT (11/17 ekg reviewed).  More recently, he presented to [facility] and was told that he had afib, though this is not well documented (no strips to review)  Today, he denies symptoms of chest pain, shortness of breath, orthopnea, PND, lower extremity edema, claudication, dizziness, presyncope, syncope, bleeding, or neurologic sequela. The patient is tolerating medications without difficulties and is otherwise without complaint today.        Past Medical History:  Diagnosis Date  . Acid reflux   . Arthritis   . Chronic back pain   . Essential hypertension   . H. pylori infection    Treated with Prevpac  . Hemorrhoids   . PSVT (paroxysmal supraventricular tachycardia) ([place])   . Tubular adenoma         Past Surgical History:  Procedure Laterality Date  . COLONOSCOPY  08/07/10   friable anal canal otherwise normal  . COLONOSCOPY WITH ESOPHAGOGASTRODUODENOSCOPY (EGD) [name] 07/06/2013   Dr. Gala Romney- inflamed hemorrhoids- multiple colonic polyps= tubular adenoma. stomach bx= hpylori treated with prevpac  . ESOPHAGOGASTRODUODENOSCOPY  08/07/10   schatzkis ring otherwise normal, s/p 56-F dilation, small hiatal hernia.chronic active gastritis on bx  . HEMORRHOID BANDING    . HERNIA REPAIR    . NO PAST SURGERIES    .  TOTAL HIP ARTHROPLASTY Left 10/04/2015   Procedure: LEFT TOTAL HIP ARTHROPLASTY ANTERIOR APPROACH;  Surgeon: Mcarthur Rossetti, MD;  [facility]: [facility];  Service: Orthopedics;  Laterality: Left;  . UMBILICAL HERNIA REPAIR  08/05/2011   Procedure: HERNIA REPAIR UMBILICAL ADULT;  Surgeon: Jamesetta So, MD;  Location: [facility];  Service: General;  Laterality: [name];           Current Outpatient Prescriptions  Medication Sig Dispense Refill  . aspirin EC 81 MG tablet Take 81 mg by mouth daily.    . hydrochlorothiazide (MICROZIDE) 12.5 MG capsule Take 12.5 mg by mouth daily.    Marland Kitchen losartan (COZAAR) 100 MG tablet Take 100 mg by mouth daily.    . metoprolol succinate (TOPROL-XL) 50 MG 24 hr tablet Take 3 tablets (150 mg total) by mouth daily. Take with or immediately following a meal. 90 tablet 3   No current facility-administered medications for this visit.     Allergies:   Patient has no known allergies.   Social History:  The patient  reports that he quit smoking about 29 years ago. His smoking use included Cigarettes. He has a 5.00 pack-year smoking history. He has never used smokeless tobacco. He reports that he drinks alcohol. He reports that he does not use drugs.   Family History:  The patient's  family history includes Diabetes in his mother; Hypertension in his mother.    ROS:  Please see the history of  present illness.   All other systems are personally reviewed and negative.    PHYSICAL EXAM: Vitals:   12/15/16 0922  BP: (!) 144/90  Pulse: 75  Resp: 18  Temp: 98.1 F (36.7 C)  SpO2: 100%   GEN: Well nourished, well developed, in no acute distress  HEENT: normal  Neck: no JVD, carotid bruits, or masses Cardiac: RRR; no murmurs, rubs, or gallops,no edema  Respiratory:  clear to auscultation bilaterally, normal work of breathing GI: soft, nontender, nondistended, + BS MS: no deformity or atrophy  Skin: warm and dry  Neuro:  Strength and sensation are  intact Psych: euthymic mood, full affect  EKG:   The prior ekg is personally reviewed and shows sinus rhythm 70 bpm, PR 154 msec, otherwise normal ekg  ekg 02/25/16 reviewed today reveals mid RP SVT     Wt Readings from Last 3 Encounters:  11/09/16 215 lb 3.2 oz (97.6 kg)  10/12/16 215 lb (97.5 kg)  04/21/16 230 lb (104.3 kg)     Other studies personally reviewed: Additional studies/ records that were reviewed today include: Dr Chuck Hint notes , echo 1/18 Review of the above records today demonstrates: as above   ASSESSMENT AND PLAN:  1.  SVT The patient has symptomatic recurrent adenosine senstive mid RP SVT.  Therapeutic strategies for supraventricular tachycardia including medicine and ablation were discussed in detail with the patient today. Risk, benefits, and alternatives to EP study and radiofrequency ablation were also discussed in detail today. These risks include but are not limited to stroke, bleeding, vascular damage, tamponade, perforation, damage to the heart and other structures, AV block requiring pacemaker, worsening renal function, and death. The patient understands these risk and wishes to proceed.  We will therefore proceed with catheter ablation at this time.  2. ? afib Not well documented chads2vasc score is 1.  I agree with Dr Domenic Polite that without better documentation that we should follow conservatively.    [name] MD, [facility] 12/15/2016 10:22 AM

## 2016-12-15 NOTE — Anesthesia Postprocedure Evaluation (Signed)
Anesthesia Post Note  Patient: Calvin Henry  Procedure(s) Performed: Procedure(s) (LRB): SVT Ablation ([name])     Patient location during evaluation: Cath Lab Anesthesia Type: MAC Level of consciousness: awake and alert Pain management: pain level controlled Vital Signs Assessment: post-procedure vital signs reviewed and stable Respiratory status: spontaneous breathing, nonlabored ventilation, respiratory function stable and patient connected to nasal cannula oxygen Cardiovascular status: stable Postop Assessment: no signs of nausea or vomiting Anesthetic complications: no    Last Vitals:  Vitals:   12/15/16 1330 12/15/16 1331  BP:  125/69  Pulse:  (!) 59  Resp:  (!) 23  Temp: 36.7 C   SpO2:  99%    Last Pain:  Vitals:   12/15/16 1330  TempSrc: Temporal                 [name]

## 2016-12-15 NOTE — Progress Notes (Addendum)
Site area: RFV x 3 Site Prior to Removal:  Level 0 Pressure Applied For: 15 min Manual:   yes Patient Status During Pull: stable  Post Pull Site:  Level 0 Post Pull Instructions Given:  0 Post Pull Pulses Present: palpable Dressing Applied:  tegaderm Bedrest begins @ 1330 [id number] Comments:

## 2016-12-16 ENCOUNTER — Encounter (HOSPITAL_COMMUNITY): Payer: Self-pay | Admitting: Internal Medicine

## 2017-01-13 ENCOUNTER — Ambulatory Visit: Payer: Self-pay | Admitting: Nurse Practitioner

## 2017-01-15 ENCOUNTER — Ambulatory Visit: Payer: Managed Care, Other (non HMO) | Admitting: Cardiology

## 2017-01-15 ENCOUNTER — Ambulatory Visit (INDEPENDENT_AMBULATORY_CARE_PROVIDER_SITE_OTHER): Payer: Self-pay | Admitting: Internal Medicine

## 2017-01-15 ENCOUNTER — Encounter: Payer: Self-pay | Admitting: Internal Medicine

## 2017-01-15 VITALS — BP 144/92 | HR 70 | Ht 66.0 in | Wt 211.6 lb

## 2017-01-15 DIAGNOSIS — I471 Supraventricular tachycardia, unspecified: Secondary | ICD-10-CM

## 2017-01-15 NOTE — Patient Instructions (Signed)
Medication Instructions:  Continue all current medications.  Labwork: none  Testing/Procedures: none  Follow-Up: As needed.    Any Other Special Instructions Will Be Listed Below (If Applicable).  If you need a refill on your cardiac medications before your next appointment, please call your pharmacy.  

## 2017-01-15 NOTE — Progress Notes (Signed)
PCP: [name], PA-C Primary Cardiologist: Dr Domenic Polite Primary EP: Dr Lindaann Pascal is a 56 y.o. male who presents today for routine electrophysiology followup.  Since his recent SVT ablation, the patient reports doing very well.   SVT has resolved.  No procedure related complications.  Today, he denies symptoms of palpitations, chest pain, shortness of breath,  lower extremity edema, dizziness, presyncope, or syncope.  The patient is otherwise without complaint today.   Past Medical History:  Diagnosis Date  . Acid reflux   . Arthritis   . Chronic back pain   . Essential hypertension   . H. pylori infection    Treated with Prevpac  . Hemorrhoids   . PSVT (paroxysmal supraventricular tachycardia) ([place])   . Tubular adenoma    Past Surgical History:  Procedure Laterality Date  . COLONOSCOPY  08/07/10   friable anal canal otherwise normal  . COLONOSCOPY WITH ESOPHAGOGASTRODUODENOSCOPY (EGD) [name] 07/06/2013   Dr. Gala Romney- inflamed hemorrhoids- multiple colonic polyps= tubular adenoma. stomach bx= hpylori treated with prevpac  . ESOPHAGOGASTRODUODENOSCOPY  08/07/10   schatzkis ring otherwise normal, s/p 56-F dilation, small hiatal hernia.chronic active gastritis on bx  . HEMORRHOID BANDING    . HERNIA REPAIR    . NO PAST SURGERIES    . SVT ABLATION [name] 12/15/2016   Procedure: SVT Ablation;  Surgeon: [name], MD;  Location: [place] [facility];  Service: Cardiovascular;  Laterality: [name];  . TOTAL HIP ARTHROPLASTY Left 10/04/2015   Procedure: LEFT TOTAL HIP ARTHROPLASTY ANTERIOR APPROACH;  Surgeon: Mcarthur Rossetti, MD;  [facility];  Service: Orthopedics;  Laterality: Left;  . UMBILICAL HERNIA REPAIR  08/05/2011   Procedure: HERNIA REPAIR UMBILICAL ADULT;  Surgeon: Jamesetta So, MD;  [facility]: [facility];  Service: General;  Laterality: [name];    ROS- all systems are reviewed and negatives except as per HPI above  Current Outpatient Prescriptions    Medication Sig Dispense Refill  . aspirin EC 81 MG tablet Take 81 mg by mouth daily.    . hydrochlorothiazide (MICROZIDE) 12.5 MG capsule Take 12.5 mg by mouth daily.    Marland Kitchen HYDROcodone-acetaminophen (NORCO) 10-325 MG tablet Take 1 tablet by mouth every 6 (six) hours as needed for severe pain.    Marland Kitchen losartan (COZAAR) 100 MG tablet Take 100 mg by mouth daily.    . metoprolol succinate (TOPROL-XL) 50 MG 24 hr tablet Take 1 tablet (50 mg total) by mouth daily. Take with or immediately following a meal. 90 tablet 3   No current facility-administered medications for this visit.     Physical Exam: Vitals:   01/15/17 1208  BP: (!) 144/92  Pulse: 70  SpO2: 97%  Weight: 211 lb 9.6 oz (96 kg)  Height: 5\' 6"  (1.676 m)    GEN- The patient is well appearing, alert and oriented x 3 today.   Head- normocephalic, atraumatic Eyes-  Sclera clear, conjunctiva pink Ears- hearing intact Oropharynx- clear Lungs- Clear to ausculation bilaterally, normal work of breathing Heart- Regular rate and rhythm, no murmurs, rubs or gallops, PMI not laterally displaced GI- soft, NT, ND, + BS Extremities- no clubbing, cyanosis, or edema  EKG tracing ordered today is personally reviewed and shows sinus rhythm, 64 bpm, PR 148 msec, QRS 84 msec, nonspecific ST/T changes similar to 09/21/16 changes Assessment and Plan:  1. SVT Resolved s/p ablation  2. ? afib Not well documented Would not anticoagulate unless he has clearly documented afib in the future  3. ETOH Cessation advised  Return as needed  [name] MD, [facility] 01/15/2017 1:10 PM

## 2017-02-03 NOTE — Progress Notes (Signed)
Cardiology Office Note  Date: 02/08/2017   ID: Calvin Henry, DOB 1960/11/22, MRN [id number]  PCP: Scherrie Bateman  Primary Cardiologist: Rozann Lesches, MD   Chief Complaint  Patient presents with  . PSVT    History of Present Illness: Calvin Henry is a 56 y.o. male last seen in July. I referred him in the interim to see Dr. Rayann Heman for evaluation of PSVT. He underwent EP study in September and was found to have dual AV nodal physiology, underwent successful radiofrequency modification of the slow AV nodal pathway.  He presents today in follow-up reporting no significant chest pain or palpitations.  I reviewed his medications.  Toprol-XL has been cut back to 50 mg daily, we discussed reducing it further to 25 mg daily.  Past Medical History:  Diagnosis Date  . Acid reflux   . Arthritis   . Chronic back pain   . Essential hypertension   . H. pylori infection    Treated with Prevpac  . Hemorrhoids   . PSVT (paroxysmal supraventricular tachycardia) ([place])   . Tubular adenoma     Past Surgical History:  Procedure Laterality Date  . COLONOSCOPY  08/07/10   friable anal canal otherwise normal  . COLONOSCOPY WITH ESOPHAGOGASTRODUODENOSCOPY (EGD) [name] 07/06/2013   Dr. Gala Romney- inflamed hemorrhoids- multiple colonic polyps= tubular adenoma. stomach bx= hpylori treated with prevpac  . ESOPHAGOGASTRODUODENOSCOPY  08/07/10   schatzkis ring otherwise normal, s/p 56-F dilation, small hiatal hernia.chronic active gastritis on bx  . HEMORRHOID BANDING    . HERNIA REPAIR    . NO PAST SURGERIES    . SVT ABLATION [name] 12/15/2016   Procedure: SVT Ablation;  Surgeon: [name], MD;  Location: [place] [facility];  Service: Cardiovascular;  Laterality: [name];  . TOTAL HIP ARTHROPLASTY Left 10/04/2015   Procedure: LEFT TOTAL HIP ARTHROPLASTY ANTERIOR APPROACH;  Surgeon: Mcarthur Rossetti, MD;  [facility];  Service: Orthopedics;  Laterality: Left;  . UMBILICAL HERNIA  REPAIR  08/05/2011   Procedure: HERNIA REPAIR UMBILICAL ADULT;  Surgeon: Jamesetta So, MD;  Location: [facility];  Service: General;  Laterality: [name];    Current Outpatient Prescriptions  Medication Sig Dispense Refill  . aspirin EC 81 MG tablet Take 81 mg by mouth daily.    . hydrochlorothiazide (MICROZIDE) 12.5 MG capsule Take 12.5 mg by mouth daily.    Marland Kitchen HYDROcodone-acetaminophen (NORCO) 10-325 MG tablet Take 1 tablet by mouth every 6 (six) hours as needed for severe pain.    Marland Kitchen losartan (COZAAR) 100 MG tablet Take 100 mg by mouth daily.    . metoprolol succinate (TOPROL XL) 25 MG 24 hr tablet Take 1 tablet (25 mg total) by mouth daily. 30 tablet 6   No current facility-administered medications for this visit.    Allergies:  Patient has no known allergies.   Social History: The patient  reports that he quit smoking about 30 years ago. His smoking use included Cigarettes. He has a 5.00 pack-year smoking history. He has never used smokeless tobacco. He reports that he drinks alcohol. He reports that he does not use drugs.   ROS:  Please see the history of present illness. Otherwise, complete review of systems is positive for none.  All other systems are reviewed and negative.   Physical Exam: VS:  BP (!) 146/88   Pulse 74   Ht 5\' 6"  (1.676 m)   Wt 208 lb (94.3 kg)   SpO2 98%   BMI  33.57 kg/m , BMI Body mass index is 33.57 kg/m.  Wt Readings from Last 3 Encounters:  02/08/17 208 lb (94.3 kg)  01/15/17 211 lb 9.6 oz (96 kg)  12/15/16 235 lb (106.6 kg)    General: Obese male, appears comfortable at rest. HEENT: Conjunctiva and lids normal, oropharynx clear. Neck: Supple, no elevated JVP or carotid bruits, no thyromegaly. Lungs: Clear to auscultation, nonlabored breathing at rest. Cardiac: Regular rate and rhythm, no S3 or significant systolic murmur, no pericardial rub. Abdomen: Soft, nontender, bowel sounds present, no guarding or rebound. Extremities: No pitting edema, distal  pulses 2+.  ECG: I personally reviewed the tracing from 01/15/2009 which shows sinus rhythm with nonspecific T-wave changes.  Recent Labwork: 12/15/2016: BUN 10; Creatinine, Ser 1.45; Hemoglobin 12.3; Platelets 244; Potassium 3.9; Sodium 144   Other Studies Reviewed Today:  Echocardiogram 04/24/2016: Study Conclusions  - Left ventricle: The cavity size was normal. Wall thickness was   increased in a pattern of moderate LVH. Systolic function was   normal. The estimated ejection fraction was in the range of 55%   to 60%. Wall motion was normal; there were no regional wall   motion abnormalities. Diastolic dysfunction, grade indeterminate. - Left atrium: The atrium was mildly to moderately dilated.  Assessment and Plan:  PSVT status post radiofrequency ablation as noted above.  He is doing very well at this point and will plan to cut Toprol-XL dose back to 25 mg daily with continued observation.  Current medicines were reviewed with the patient today.  Disposition: Follow-up in 6 months.  Signed, Satira Sark, MD, [facility] 02/08/2017 3:18 PM    [facility] [address], [place], [place] [address] Phone: [phone]; Fax: [phone]

## 2017-02-08 ENCOUNTER — Ambulatory Visit (INDEPENDENT_AMBULATORY_CARE_PROVIDER_SITE_OTHER): Payer: Self-pay | Admitting: Cardiology

## 2017-02-08 ENCOUNTER — Encounter: Payer: Self-pay | Admitting: Cardiology

## 2017-02-08 VITALS — BP 146/88 | HR 74 | Ht 66.0 in | Wt 208.0 lb

## 2017-02-08 DIAGNOSIS — I471 Supraventricular tachycardia, unspecified: Secondary | ICD-10-CM

## 2017-02-08 MED ORDER — METOPROLOL SUCCINATE ER 25 MG PO TB24: 25.0000 mg | ORAL_TABLET | Freq: Every day | ORAL | 6 refills | Status: DC

## 2017-02-08 NOTE — Patient Instructions (Signed)
Medication Instructions:  Your physician has recommended you make the following change in your medication:   Decrease Toprol-XL to 25 mg daily  Please continue all other medications as prescribed  Labwork: NONE  Testing/Procedures: NONE  Follow-Up: Your physician wants you to follow-up in: [place]. You will receive a reminder letter in the mail two months in advance. If you don't receive a letter, please call our office to schedule the follow-up appointment.  Any Other Special Instructions Will Be Listed Below (If Applicable).  If you need a refill on your cardiac medications before your next appointment, please call your pharmacy.

## 2017-02-11 ENCOUNTER — Ambulatory Visit (INDEPENDENT_AMBULATORY_CARE_PROVIDER_SITE_OTHER): Payer: Self-pay | Admitting: Nurse Practitioner

## 2017-02-11 ENCOUNTER — Other Ambulatory Visit: Payer: Self-pay

## 2017-02-11 ENCOUNTER — Encounter: Payer: Self-pay | Admitting: Nurse Practitioner

## 2017-02-11 VITALS — BP 181/94 | HR 66 | Temp 97.4°F | Ht 66.0 in | Wt 204.0 lb

## 2017-02-11 DIAGNOSIS — Z860101 Personal history of adenomatous and serrated colon polyps: Secondary | ICD-10-CM | POA: Insufficient documentation

## 2017-02-11 DIAGNOSIS — R195 Other fecal abnormalities: Secondary | ICD-10-CM

## 2017-02-11 DIAGNOSIS — Z86010 Personal history of colonic polyps: Secondary | ICD-10-CM

## 2017-02-11 DIAGNOSIS — R634 Abnormal weight loss: Secondary | ICD-10-CM

## 2017-02-11 NOTE — Assessment & Plan Note (Signed)
Per primary care the patient had heme positive stool. He did note some minor rectal bleeding around the time that the card was submitted. No bleeding since. He has a history of hemorrhoids status post banding. Has not had hemorrhoid symptoms as of late and has not needed Anusol rectal cream. He is overdue or surveillance colonoscopy and we will arrange for this at this time. Return for follow-up based on postprocedure recommendations.  Proceed with TCS with 12.5 mg preprocedure Phenergan with Dr. Gala Romney in near future: the risks, benefits, and alternatives have been discussed with the patient in detail. The patient states understanding and desires to proceed.  The patient is currently on hydrocodone. He drinks a few times a week and consumes 1-2 drinks per episode. No other anticoagulants, anxiolytics, chronic pain medications, or antidepressants. We will add 12.5 mg preprocedure Phenergan to his procedure to promote adequate sedation.

## 2017-02-11 NOTE — Progress Notes (Signed)
Primary Care Physician:  Scherrie Bateman Primary Gastroenterologist:  Dr. Gala Romney  Chief Complaint  Patient presents with  . Hemorrhoids    has had them for a while. Does not bother him right now    HPI:   Calvin Henry is a 56 y.o. male who presents On referral from primary care for heme positive stool. Patient has a history of hemorrhoids. We saw him in 2015 for hemorrhoid banding in office. She does have a history of colon polyps.   His last colonoscopy was completed 07/06/2013 which found adequate prep, somewhat excoriated anal canal/internal hemorrhoids, single 4 mm polyp at the rectosigmoid junction, single 5 mm polyp in the midascending segment, and a single polyp at the base of the cecum. Surgical pathology found the polyps to be tubular adenoma. Inflamed hemorrhoids deemed likely source of hematochezia. Trial of Anusol suppositories was recommended. Also recommended 3 year repeat exam (March 2018) is. He is currently due.   EGD completed the same time found incomplete noncritical Schatzki's ring not manipulated, abnormal gastric mucosa status post biopsy which was found to contain H. pylori. He was treated for H. pylori.  Today he states he's doing well overall. Has not seen any blood in his stool since the hemoccult card. Has not had any recent hemorrhoid symptoms. Denies abdominal pain, N/V, fever, chills, acute changes in bowel habits. Feels he has lost weight as of late due to stress. Objectively is down 30 lbs in the past 3 years. Has not needed/used Anusol since hemorrhoid banding. Denies chest pain, dyspnea, dizziness, lightheadedness, syncope, near syncope. Denies any other upper or lower GI symptoms.  Past Medical History:  Diagnosis Date  . Acid reflux   . Arthritis   . Chronic back pain   . Essential hypertension   . H. pylori infection    Treated with Prevpac  . Hemorrhoids   . PSVT (paroxysmal supraventricular tachycardia) ([place])   . Tubular adenoma      Past Surgical History:  Procedure Laterality Date  . COLONOSCOPY  08/07/10   friable anal canal otherwise normal  . COLONOSCOPY WITH ESOPHAGOGASTRODUODENOSCOPY (EGD) [name] 07/06/2013   Dr. Gala Romney- inflamed hemorrhoids- multiple colonic polyps= tubular adenoma. stomach bx= hpylori treated with prevpac  . ESOPHAGOGASTRODUODENOSCOPY  08/07/10   schatzkis ring otherwise normal, s/p 56-F dilation, small hiatal hernia.chronic active gastritis on bx  . HEMORRHOID BANDING    . HERNIA REPAIR    . SVT ABLATION [name] 12/15/2016   Procedure: SVT Ablation;  Surgeon: [name], MD;  Location: [place] [facility];  Service: Cardiovascular;  Laterality: [name];  . TOTAL HIP ARTHROPLASTY Left 10/04/2015   Procedure: LEFT TOTAL HIP ARTHROPLASTY ANTERIOR APPROACH;  Surgeon: Mcarthur Rossetti, MD;  [facility];  Service: Orthopedics;  Laterality: Left;  . UMBILICAL HERNIA REPAIR  08/05/2011   Procedure: HERNIA REPAIR UMBILICAL ADULT;  Surgeon: Jamesetta So, MD;  Location: [facility];  Service: General;  Laterality: [name];    Current Outpatient Prescriptions  Medication Sig Dispense Refill  . aspirin EC 81 MG tablet Take 81 mg by mouth daily.    . hydrochlorothiazide (MICROZIDE) 12.5 MG capsule Take 12.5 mg by mouth daily.    Marland Kitchen HYDROcodone-acetaminophen (NORCO) 10-325 MG tablet Take 1 tablet by mouth every 6 (six) hours as needed for severe pain.    Marland Kitchen losartan (COZAAR) 100 MG tablet Take 100 mg by mouth daily.    . metoprolol succinate (TOPROL XL) 25 MG 24 hr tablet Take  1 tablet (25 mg total) by mouth daily. 30 tablet 6   No current facility-administered medications for this visit.     Allergies as of 02/11/2017  . (No Known Allergies)    Family History  Problem Relation Age of Onset  . Diabetes Mother   . Hypertension Mother   . Colon cancer Neg Hx     Social History   Social History  . Marital status: Married    Spouse name: [name]  . Number of children: [name]  . Years of education: [name]    Occupational History  . Not on file.   Social History Main Topics  . Smoking status: Former Smoker    Packs/day: 0.50    Years: 10.00    Types: Cigarettes    Quit date: 12/01/1986  . Smokeless tobacco: Never Used     Comment: Quit x 20 plus years  . Alcohol use 0.0 oz/week     Comment: a beer 1-2 times a week  . Drug use: No     Comment: remote marijuana use   . Sexual activity: Yes   Other Topics Concern  . 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 ENT: Negative for hoarseness, difficulty swallowing. CV: Negative for chest pain, angina, palpitations, peripheral edema.  Respiratory: Negative for dyspnea at rest, cough, sputum, wheezing.  GI: See history of present illness. MS: Negative for joint pain, low back pain.  Derm: Negative for rash. 2-3 isolated left lower leg spots that are itching.  Endo: Negative for unusual weight change.  Heme: Negative for bruising or bleeding. Allergy: Negative for rash or hives.    Physical Exam: BP (!) 181/94   Pulse 66   Temp (!) 97.4 F (36.3 C) (Oral)   Ht 5\' 6"  (1.676 m)   Wt 204 lb (92.5 kg)   BMI 32.93 kg/m  General:   Alert and oriented. Pleasant and cooperative. Well-nourished and well-developed.  Head:  Normocephalic and atraumatic. Eyes:  Without icterus, sclera clear and conjunctiva pink.  Ears:  Normal auditory acuity. Cardiovascular:  S1, S2 present without murmurs appreciated. Extremities without clubbing or edema. Respiratory:  Clear to auscultation bilaterally. No wheezes, rales, or rhonchi. No distress.  Gastrointestinal:  +BS, soft, non-tender and non-distended. No HSM noted. No guarding or rebound. No masses appreciated.  Rectal:  Deferred  Musculoskalatal:  Symmetrical without gross deformities.  Skin:  Two or three small isolated left lower leg spots without edema,  erythema, crusting, or drainage; nontender. Neurologic:  Alert and oriented x4;  grossly normal neurologically. Psych:  Alert and cooperative. Normal mood and affect. Heme/Lymph/Immune: No excessive bruising noted.    02/11/2017 9:38 AM   Disclaimer: This note was dictated with voice recognition software. Similar sounding words can inadvertently be transcribed and may not be corrected upon review.

## 2017-02-11 NOTE — Assessment & Plan Note (Signed)
History of tubular adenoma colon polyps. He had 3 polyps on his last colonoscopy and recommended 3 year repeat exam. He is currently overdue for surveillance. Given his rectal bleeding, as per below, we will proceed with his colonoscopy to further evaluate at this time.

## 2017-02-11 NOTE — Assessment & Plan Note (Signed)
The patient notes subjective weight loss. Objectively he has lost about 30 pounds in the past 3 years. He attributes this to stress as he is going through a marital separation and given his stressors he generally does not have a great appetite. He is due for colonoscopy given history of adenomatous colon polyps. We will proceed with that at this time to further evaluate. No other red flag/warning signs or symptoms other than trivial rectal bleeding in the setting of hemorrhoids.

## 2017-02-11 NOTE — Patient Instructions (Signed)
1. We will schedule your colonoscopy for you. 2. Further recommendations will be made based on the results of your procedure. 3. Return for follow-up based on recommendations made after colonoscopy. 4. Call us if you have any questions or concerns.

## 2017-02-11 NOTE — Progress Notes (Signed)
CC'ED TO PCP 

## 2017-02-15 ENCOUNTER — Encounter (HOSPITAL_COMMUNITY): Payer: Self-pay | Admitting: Emergency Medicine

## 2017-02-15 ENCOUNTER — Emergency Department (HOSPITAL_COMMUNITY)
Admission: EM | Admit: 2017-02-15 | Discharge: 2017-02-15 | Disposition: A | Discharge status: Home / Self Care | Payer: Self-pay | Attending: Emergency Medicine | Admitting: Emergency Medicine

## 2017-02-15 ENCOUNTER — Telehealth: Payer: Self-pay

## 2017-02-15 ENCOUNTER — Other Ambulatory Visit: Payer: Self-pay

## 2017-02-15 ENCOUNTER — Emergency Department (HOSPITAL_COMMUNITY): Payer: Self-pay

## 2017-02-15 DIAGNOSIS — E876 Hypokalemia: Secondary | ICD-10-CM | POA: Insufficient documentation

## 2017-02-15 DIAGNOSIS — I4892 Unspecified atrial flutter: Secondary | ICD-10-CM | POA: Insufficient documentation

## 2017-02-15 DIAGNOSIS — Z79899 Other long term (current) drug therapy: Secondary | ICD-10-CM | POA: Insufficient documentation

## 2017-02-15 DIAGNOSIS — I10 Essential (primary) hypertension: Secondary | ICD-10-CM | POA: Insufficient documentation

## 2017-02-15 DIAGNOSIS — Z87891 Personal history of nicotine dependence: Secondary | ICD-10-CM | POA: Insufficient documentation

## 2017-02-15 DIAGNOSIS — Z7982 Long term (current) use of aspirin: Secondary | ICD-10-CM | POA: Insufficient documentation

## 2017-02-15 LAB — CBC
HCT: 46.8 % (ref 39.0–52.0)
Hemoglobin: 15.6 g/dL (ref 13.0–17.0)
MCH: 32.6 pg (ref 26.0–34.0)
MCHC: 33.3 g/dL (ref 30.0–36.0)
MCV: 97.9 fL (ref 78.0–100.0)
Platelets: 280 10*3/uL (ref 150–400)
RBC: 4.78 MIL/uL (ref 4.22–5.81)
RDW: 15 % (ref 11.5–15.5)
WBC: 6.1 10*3/uL (ref 4.0–10.5)

## 2017-02-15 LAB — BASIC METABOLIC PANEL
Anion gap: 14 (ref 5–15)
BUN: 13 mg/dL (ref 6–20)
CO2: 23 mmol/L (ref 22–32)
Calcium: 9.1 mg/dL (ref 8.9–10.3)
Chloride: 102 mmol/L (ref 101–111)
Creatinine, Ser: 1.42 mg/dL — ABNORMAL HIGH (ref 0.61–1.24)
GFR calc Af Amer: >60 mL/min (ref >60)
GFR calc non Af Amer: 54 mL/min — ABNORMAL LOW (ref >60)
Glucose, Bld: 97 mg/dL (ref 65–99)
Potassium: 3 mmol/L — ABNORMAL LOW (ref 3.5–5.1)
Sodium: 139 mmol/L (ref 135–145)

## 2017-02-15 LAB — TSH: TSH: 0.509 u[IU]/mL (ref 0.350–4.500)

## 2017-02-15 LAB — MAGNESIUM: Magnesium: 1.7 mg/dL (ref 1.7–2.4)

## 2017-02-15 LAB — TROPONIN I: Troponin I: <0.03 ng/mL (ref <0.03)

## 2017-02-15 MED ORDER — METOPROLOL TARTRATE 50 MG PO TABS
50.0000 mg | ORAL_TABLET | Freq: Once | ORAL | Status: AC
  Administered 2017-02-15: 50 mg via ORAL
  Filled 2017-02-15: qty 1

## 2017-02-15 MED ORDER — POTASSIUM CHLORIDE ER 10 MEQ PO TBCR: 10.0000 meq | EXTENDED_RELEASE_TABLET | Freq: Every day | ORAL | 0 refills | Status: DC

## 2017-02-15 MED ORDER — METOPROLOL TARTRATE 5 MG/5ML IV SOLN: 5.0000 mg | Freq: Once | INTRAVENOUS | Status: DC

## 2017-02-15 MED ORDER — POTASSIUM CHLORIDE CRYS ER 20 MEQ PO TBCR
40.0000 meq | EXTENDED_RELEASE_TABLET | Freq: Once | ORAL | Status: AC
  Administered 2017-02-15: 40 meq via ORAL
  Filled 2017-02-15: qty 2

## 2017-02-15 NOTE — Discharge Instructions (Signed)
Please start taking your Metoprolol 50mg  a day again. ER for worsening symptoms.

## 2017-02-15 NOTE — ED Triage Notes (Signed)
PT c/o left sided throbbing chest pain that started about an hour prior to ED arrival. PT states also some SOB since chest pain onset today.

## 2017-02-15 NOTE — ED Notes (Signed)
Pt states his chest pain has eased off.

## 2017-02-15 NOTE — Telephone Encounter (Signed)
Patient currently in ER at [facility]

## 2017-02-15 NOTE — Telephone Encounter (Signed)
Agree with recommendation for ER assessment.  He needs to have an ECG to make sure he is not back in SVT.  He just recently underwent radiofrequency ablation by Dr. Rayann Heman and his beta-blocker dose has been cut back.

## 2017-02-15 NOTE — Telephone Encounter (Signed)
Patient contacted office stating that he feels like his heart is fluttering, he's very hot and feels like he is going to pass out. Patient states he is a little short of breath. Patient is currently at work. Advised patient to sit down and make sure he has someone with him. Patient states he took all his medications this morning. Patient states he is outside and cooling off. Patient states he gets off work at 12:00. Advised patient not to drive since he feels like he is going to pass out. Advised patient he needs to go to the ER to be evaluated. Patient stated he did not want to go to the ER right now he wants to wait and see if this will pass and finish his shift. Advised patient he could cause more harm if he were to pass out while working. Patient stated he would call his wife to come get him and if this did not pass in the next little bit he would have her take him to the ER.

## 2017-02-15 NOTE — ED Notes (Signed)
Pt denies any chest pain at this time  NSR rate 60-70'S .Marland Kitchen   EKG repeated and given to Dr. Sabra Heck.

## 2017-02-15 NOTE — ED Provider Notes (Signed)
[facility] EMERGENCY DEPARTMENT Provider Note   CSN: [id number] Arrival date & time: 02/15/17  1102     History   Chief Complaint Chief Complaint  Patient presents with  . Chest Pain    HPI Calvin Henry is a 56 y.o. male.  HPI  The patient is a 56 year old male who has a history of paroxysmal SVT, he is followed by Dr. Domenic Polite as well as Dr. all read the latter of which who did an electrophysiology study in September.  During that time the patient was found to have a dual AV nodal physiology, he underwent some radiofrequency modification of the slow AV nodal pathway and presented most recently to the cardiology office on October 29 approximately 1 week ago during which time he had been doing well.  He had been on Toprol-XL which was being reduced from 50 mg to going back to 25 mg/day.  The patient reports that today while he was in his usual state of health at work he developed acute onset of left-sided chest pain with associated palpitations, there was no associated fevers, difficulty breathing, swelling of the legs or radiation of the pain.  The patient's most recent echocardiogram showed EF of 55-60%  Past Medical History:  Diagnosis Date  . Acid reflux   . Arthritis   . Chronic back pain   . Essential hypertension   . H. pylori infection    Treated with Prevpac  . Hemorrhoids   . PSVT (paroxysmal supraventricular tachycardia) ([place])   . Tubular adenoma     Patient Active Problem List   Diagnosis Date Noted  . Heme + stool 02/11/2017  . History of adenomatous polyp of colon 02/11/2017  . Avascular necrosis of bone of left hip ([place]) 10/04/2015  . Status post total replacement of left hip 10/04/2015  . Abnormal weight loss 06/21/2013  . Early satiety 06/21/2013  . Rectal bleeding 06/16/2011  . GERD 06/30/2010  . RECTAL BLEEDING 06/30/2010  . OTHER DYSPHAGIA 06/30/2010    Past Surgical History:  Procedure Laterality Date  . COLONOSCOPY  08/07/10   friable  anal canal otherwise normal  . ESOPHAGOGASTRODUODENOSCOPY  08/07/10   schatzkis ring otherwise normal, s/p 56-F dilation, small hiatal hernia.chronic active gastritis on bx  . HEMORRHOID BANDING    . HERNIA REPAIR         Home Medications    Prior to Admission medications   Medication Sig Start Date End Date Taking? Authorizing Provider  aspirin EC 81 MG tablet Take 81 mg by mouth daily.   Yes [provider]  hydrochlorothiazide (MICROZIDE) 12.5 MG capsule Take 12.5 mg by mouth daily.   Yes [provider]  HYDROcodone-acetaminophen (NORCO) 10-325 MG tablet Take 1 tablet by mouth every 6 (six) hours as needed for severe pain.   Yes [provider]  metoprolol succinate (TOPROL XL) 25 MG 24 hr tablet Take 1 tablet (25 mg total) by mouth daily. 02/08/17  Yes Satira Sark, MD  losartan (COZAAR) 100 MG tablet Take 100 mg by mouth daily.    [provider]  potassium chloride (K-DUR) 10 MEQ tablet Take 1 tablet (10 mEq total) daily for 10 days by mouth. 02/15/17 02/25/17  Noemi Chapel, MD    Family History Family History  Problem Relation Age of Onset  . Diabetes Mother   . Hypertension Mother   . Colon cancer Neg Hx     Social History Social History   Tobacco Use  . Smoking status:  Former Smoker    Packs/day: 0.50    Years: 10.00    Pack years: 5.00    Types: Cigarettes    Last attempt to quit: 12/01/1986    Years since quitting: 30.2  . Smokeless tobacco: Never Used  . Tobacco comment: Quit x 20 plus years  Substance Use Topics  . Alcohol use: Yes    Alcohol/week: 0.0 oz    Comment: a beer 1-2 times a week  . Drug use: No    Comment: remote marijuana use      Allergies   Patient has no known allergies.   Review of Systems Review of Systems  All other systems reviewed and are negative.    Physical Exam Updated Vital Signs BP (!) 159/92   Pulse 64   Temp 98 F (36.7 C) (Oral)   Resp (!) 29   Ht 5\' 6"  (1.676 m)    Wt 94.3 kg (208 lb)   SpO2 98%   BMI 33.57 kg/m   Physical Exam  Constitutional: He appears well-developed and well-nourished. No distress.  HENT:  Head: Normocephalic and atraumatic.  Mouth/Throat: Oropharynx is clear and moist. No oropharyngeal exudate.  Eyes: Conjunctivae and EOM are normal. Pupils are equal, round, and reactive to light. Right eye exhibits no discharge. Left eye exhibits no discharge. No scleral icterus.  Neck: Normal range of motion. Neck supple. No JVD present. No thyromegaly present.  Cardiovascular: Normal heart sounds and intact distal pulses. Exam reveals no gallop and no friction rub.  No murmur heard. Regular tachycardia mixed with a regular sinus rhythm, jumping back and forth between 101 140 bpm.  There is no irregular rhythm here  Pulmonary/Chest: Effort normal and breath sounds normal. No respiratory distress. He has no wheezes. He has no rales.  Abdominal: Soft. Bowel sounds are normal. He exhibits no distension and no mass. There is no tenderness.  Musculoskeletal: Normal range of motion. He exhibits no edema or tenderness.  Lymphadenopathy:    He has no cervical adenopathy.  Neurological: He is alert. Coordination normal.  Skin: Skin is warm and dry. No rash noted. No erythema.  Psychiatric: He has a normal mood and affect. His behavior is normal.  Nursing note and vitals reviewed.    ED Treatments / Results  Labs (all labs ordered are listed, but only abnormal results are displayed) Labs Reviewed  BASIC METABOLIC PANEL - Abnormal; Notable for the following components:      Result Value   Potassium 3.0 (*)    Creatinine, Ser 1.42 (*)    GFR calc non Af Amer 54 (*)    All other components within normal limits  CBC  TROPONIN I  MAGNESIUM  TSH    EKG  EKG Interpretation  Date/Time:  Monday February 15 2017 11:31:40 EST Ventricular Rate:  154 PR Interval:  144 QRS Duration: 82 QT Interval:  301 QTC Calculation: 492 R Axis:   -9 Text  Interpretation:  Atrial flutter vs PSVT Repolarization abnormality, prob rate related ST elevation, consider inferior injury Abnormal ekg Since last tracing rate faster Confirmed by Noemi Chapel [id number]) on 02/15/2017 11:39:33 AM        EKG Interpretation  Date/Time:  Monday February 15 2017 11:31:40 EST Ventricular Rate:  154 PR Interval:  144 QRS Duration: 82 QT Interval:  301 QTC Calculation: 492 R Axis:   -9 Text Interpretation:  Atrial flutter vs PSVT Repolarization abnormality, prob rate related ST elevation, consider inferior injury Abnormal ekg Since last tracing  rate faster Confirmed by Noemi Chapel [phone]) on 02/15/2017 11:39:33 AM        Radiology Dg Chest 2 View  Result Date: 02/15/2017 CLINICAL DATA:  Onset of chest pain this morning greatest on the left. Recent URI symptoms with cough. Occasional smoker. EXAM: CHEST  2 VIEW COMPARISON:  Chest x-ray of September 20, 2016 FINDINGS: The lungs are adequately inflated. There is no focal infiltrate. The interstitial markings are mildly prominent. The heart and pulmonary vascularity are normal. The mediastinum is normal in width. There is mild tortuosity of the descending thoracic aorta. The bony thorax exhibits no acute abnormality. IMPRESSION: Mild chronic bronchitic changes, stable. There is no acute cardiopulmonary abnormality. Electronically Signed   By: David  Martinique M.D.   On: 02/15/2017 12:48    Procedures Procedures (including critical care time)  Medications Ordered in ED Medications  metoprolol tartrate (LOPRESSOR) tablet 50 mg (50 mg Oral Given 02/15/17 1209)  potassium chloride SA (K-DUR,KLOR-CON) CR tablet 40 mEq (40 mEq Oral Given 02/15/17 1303)     Initial Impression / Assessment and Plan / ED Course  I have reviewed the triage vital signs and the nursing notes.  Pertinent labs & imaging results that were available during my care of the patient were reviewed by me and considered in my medical decision making (see  chart for details).     Care was discussed with Dr. Harl Bowie of the cardiology service who will come to see the patient.  Suspect this is related to the patient's tapering of his beta-blocker, he has been given a dose of Lopressor by mouth, the patient has a negative troponin, cardiology to see  Dr. Harl Bowie has recommedned Outpt f/u in offic3e Rate is controlled 50mg  / day per Dr. Harl Bowie of metoprolol Pt in agreement Has rate of 70 at this time, with no sx. K was slightly low - K Rx given Pt stable and ina greement.  Final Clinical Impressions(s) / ED Diagnoses   Final diagnoses:  Atrial flutter with rapid ventricular response ([place])  Hypokalemia    ED Discharge Orders        Ordered    potassium chloride (K-DUR) 10 MEQ tablet  Daily     02/15/17 1521       Noemi Chapel, MD 02/15/17 1521

## 2017-02-15 NOTE — Consult Note (Addendum)
Cardiology Consultation:   Patient ID: Calvin Henry; [id number]; 10-Oct-1960   Admit date: 02/15/2017 Date of Consult: 02/15/2017  Primary Care Provider: [name], PA-C Primary Cardiologist: Dr Johnny Bridge Primary Electrophysiologist:  Dr [name]   Patient Profile:   Calvin Henry is a 56 y.o. male with a hx of PSVT who is being seen today for the evaluation of tachcyardia at the request of Dr Sabra Heck.  History of Present Illness:   Calvin Henry is a 56 yo male with history PSVT s/pt ablation by Dr Rayann Heman and HTN presents with chest pain. Patient had done very well s/p recent ablation, at last outpatient visit no complaints and Toprol was decreased to 25mg  daily. 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 Felt heart racing, mild SOB. Similar to his prior episodes of palpitations. Denies any specific chest pain or pressure. At time of interview symptoms have resolved.    K 4, Cr 1.42 (1.45 2 months ago), Mg pending, TSH pending, Hgb 15.6, Plt 280 EKG aflutter rates 150 CXR pending Echo Jan 2018 LVEF 55-60%, no WMAs, indeterminate diastolic function Past Medical History:  Diagnosis Date  . Acid reflux   . Arthritis   . Chronic back pain   . Essential hypertension   . H. pylori infection    Treated with Prevpac  . Hemorrhoids   . PSVT (paroxysmal supraventricular tachycardia) ([place])   . Tubular adenoma     Past Surgical History:  Procedure Laterality Date  . COLONOSCOPY  08/07/10   friable anal canal otherwise normal  . ESOPHAGOGASTRODUODENOSCOPY  08/07/10   schatzkis ring otherwise normal, s/p 56-F dilation, small hiatal hernia.chronic active gastritis on bx  . HEMORRHOID BANDING    . HERNIA REPAIR        Inpatient Medications: Scheduled Meds:  Continuous Infusions:  PRN Meds:   Allergies:   No Known Allergies  Social History:   Social History   Socioeconomic History  . Marital status: Married     Spouse name: Not on file  . Number of children: Not on file  . Years of education: Not on file  . Highest education level: Not on file  Social Needs  . Financial resource strain: Not on file  . Food insecurity - worry: Not on file  . Food insecurity - inability: Not on file  . Transportation needs - medical: Not on file  . Transportation needs - non-medical: Not on file  Occupational History  . Not on file  Tobacco Use  . Smoking status: Former Smoker    Packs/day: 0.50    Years: 10.00    Pack years: 5.00    Types: Cigarettes    Last attempt to quit: 12/01/1986    Years since quitting: 30.2  . Smokeless tobacco: Never Used  . Tobacco comment: Quit x 20 plus years  Substance and Sexual Activity  . Alcohol use: Yes    Alcohol/week: 0.0 oz    Comment: a beer 1-2 times a week  . Drug use: No    Comment: remote marijuana use   . Sexual activity: Yes  Other Topics Concern  . 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 Diabetes Mother   . Hypertension Mother   . Colon cancer Neg Hx  ROS:  Please see the history of present illness.  ROS  All other ROS reviewed and negative.     Physical Exam/Data:   Vitals:   02/15/17 1145 02/15/17 1200 02/15/17 1215 02/15/17 1230  BP: (!) 148/77 132/83 (!) 143/91 (!) 141/94  Pulse: 94 97 80 (!) 103  Resp: (!) 0 (!) 0 (!) 0 (!) 7  Temp:      TempSrc:      SpO2: 97% 97% 97% 97%  Weight:      Height:       No intake or output data in the 24 hours ending 02/15/17 1247 Filed Weights   02/15/17 1113  Weight: 208 lb (94.3 kg)   Body mass index is 33.57 kg/m.  General:  Well nourished, well developed, in no acute distress HEENT: normal Lymph: no adenopathy Neck: no JVD Endocrine:  No thryomegaly Vascular: No carotid bruits; FA pulses 2+ bilaterally without bruits  Cardiac:  Irregular, tachycardia Lungs:   clear to auscultation bilaterally, no wheezing, rhonchi or rales  Abd: soft, nontender, no hepatomegaly  Ext: no edema Musculoskeletal:  No deformities, BUE and BLE strength normal and equal Skin: warm and dry  Neuro:  CNs 2-12 intact, no focal abnormalities noted Psych:  Normal affect   EKG:  Aflutter with RVR Telemetry:  Aflutter with varaible rates    Laboratory Data:  Chemistry Recent Labs  Lab 02/15/17 1118  NA 139  K 3.0*  CL 102  CO2 23  GLUCOSE 97  BUN 13  CREATININE 1.42*  CALCIUM 9.1  GFRNONAA 54*  GFRAA >60  ANIONGAP 14    No results for input(s): PROT, ALBUMIN, AST, ALT, ALKPHOS, BILITOT in the last 168 hours. Hematology Recent Labs  Lab 02/15/17 1118  WBC 6.1  RBC 4.78  HGB 15.6  HCT 46.8  MCV 97.9  MCH 32.6  MCHC 33.3  RDW 15.0  PLT 280   Cardiac Enzymes Recent Labs  Lab 02/15/17 1118  TROPONINI <0.03   No results for input(s): TROPIPOC in the last 168 hours.  BNPNo results for input(s): BNP, PROBNP in the last 168 hours.  DDimer No results for input(s): DDIMER in the last 168 hours.  Radiology/Studies:  No results found.  Assessment and Plan:   1. Palpitations/Aflutter - patient presents with new presentation of aflutter. Prior PSVT s/p ablation, this is the first documentation of flutter - rates improved but remain elevated after receiving lopressor oral 50mg . We will dose IV lopressor and follow rates, if can get controlled in ER would be ok for discharge with increasing his ToproL XL to 50mg .  - CHADS2Vasc score is 1(HTN). History of heme positive stools followed by GI. Score of 1 and prior heme positive stools will hold on starting anticoag, will touch base with GI if ok to start, anticipate starting anticoagulation as outpatient with the assumption he likely will need a cardioversion after 3 weeks of anticoag.  - fairly specific timing of onset of symptoms. He has long history of palpitations thought to be related to PSVT, I cannot  say fore sure he has not had aflutter before, and thus would not proceed directly with anticoag, plus he needs to be cleared by GI for anticoag.   2. Hypokalemia - given 59mEq of KCl. May have played some role in his arrhythmia - Mg pending - does not appear hypokalemia has been in issue in the past, if recurrent may need to consider chronic replacement or changing HCTZ to maxide.  Follow rates in ER, I have dosed lopressor 5mg  IV x 1. Can dose additional 2 times if needed, could also consider IV bolus of diltiazem.    3pm addendum Patient converted back to NSR on his own, did not require IV lopressor. [place] for discharge from ER, please increase his Toprol XL to 50mg  daily. We will arrange f/u in 1-2 weeks.  Carlyle Dolly MD   For questions or updates, please contact [facility] Please consult [email or web address] for contact info under Cardiology/STEMI.   Merrily Pew, MD  02/15/2017 12:47 PM

## 2017-02-15 NOTE — ED Notes (Signed)
Spoke with Dr. Harl Bowie and updated on pt condition.  Heart rate in the 60's NSR and pt pain free.

## 2017-02-16 ENCOUNTER — Telehealth: Payer: Self-pay | Admitting: Cardiology

## 2017-02-16 NOTE — Progress Notes (Signed)
Touch base with Roseanne Kaufman from GI. Patient with colonscopy planned for December. We will hold on anticoagulation until after that study, he has recent heme positive stool. His CHADS2Vasc score is 1, anticoagulation is borderline indicated. He self converted from aflutter to SR and thus a cardioversion is not required and anticoag not needed from that standpoint. For now hold on anticoag, reconsider after additional Gi testing   Zandra Abts MD

## 2017-02-28 NOTE — Progress Notes (Signed)
Cardiology Office Note    Date:  03/01/2017   ID:  Calvin Henry, DOB August 04, 1960, MRN [id number]  PCP:  Calvin Samples, PA-C  Cardiologist: Dr. Domenic Polite Primary Electrophysiologist: Dr. Rayann Heman   Chief Complaint  Patient presents with  . Follow-up    Recent Emergency Dept Visit    History of Present Illness:    Calvin Henry is a 56 y.o. male with past medical history of pSVT (s/p ablation in 12/2016), HTN, and hemorrhoids who presents to the office today for follow-up from a recent Emergency Department visit.   He was last examined by Dr. Domenic Polite on 02/08/2017 and denied any recurrent chest pain or palpitations. Toprol-XL was reduced from 50mg  daily to 25mg  daily.   On 02/15/2017, he developed an acute episode of chest pain with associated palpitations and proceeded to [facility] ED for further evaluation. He was found to be in atrial flutter with HR in the 150's. Labs showed a Na+ of 139, K+ 3.0, creatinine 1.42, WBC 6.1, Hgb 15.6, and platelets 280. Troponin was negative and TSH was stable at 0.509. He was evaluated in the ED by Dr. Harl Bowie and experienced spontaneous conversion to NSR. He was not started on anticoagulation at that time as his CHA2DS2-VASc Score is 1 and he is undergoing GI evaluation for heme-positive stools. His Toprol-XL was increased back to 50mg  daily.   In talking with the patient today, he reports doing well since his recent ED visit. He denies any repeat episodes of chest pain, dyspnea on exertion, or palpitations. No recent lightheadedness, dizziness, or presyncope. He does not check his blood pressure or heart rate regularly but he is maintaining NSR at the time of today's visit.   He was given a prescription for potassium supplementation at the time of his ED visit but did not start this. He has since increased intake of potassium rich foods.  He also remained on Toprol-XL 25 mg daily and is tolerating this well.  He does consume alcohol and  caffeine regularly and has not limited his intake of this thus far.  Does report being under increased social stress as his wife recently said she wanted a separation.   Past Medical History:  Diagnosis Date  . Acid reflux   . Arthritis   . Atrial flutter ([place])    a. diagnosed in 02/2017 with spontaneous conversion back to NSR.   Marland Kitchen Chronic back pain   . Essential hypertension   . H. pylori infection    Treated with Prevpac  . Hemorrhoids   . PSVT (paroxysmal supraventricular tachycardia) ([place])    a. s/p ablation in 12/2016  . Tubular adenoma     Past Surgical History:  Procedure Laterality Date  . COLONOSCOPY  08/07/10   friable anal canal otherwise normal  . COLONOSCOPY WITH ESOPHAGOGASTRODUODENOSCOPY (EGD) [name] 07/06/2013   Performed by Daneil Dolin, MD at [place]  . ESOPHAGOGASTRODUODENOSCOPY  08/07/10   schatzkis ring otherwise normal, s/p 56-F dilation, small hiatal hernia.chronic active gastritis on bx  . HEMORRHOID BANDING    . HERNIA REPAIR    . HERNIA REPAIR UMBILICAL ADULT [name] 0/97/3532   Performed by Jamesetta So, MD at [facility]  . LEFT TOTAL HIP ARTHROPLASTY ANTERIOR APPROACH Left 10/04/2015   Performed by Mcarthur Rossetti, MD at [facility]  . SVT Ablation [name] 12/15/2016   Performed by [name], MD at [facility] INVASIVE CV LAB    Current Medications: Outpatient Medications Prior to  Visit  Medication Sig Dispense Refill  . aspirin EC 81 MG tablet Take 81 mg by mouth daily.    . hydrochlorothiazide (MICROZIDE) 12.5 MG capsule Take 12.5 mg by mouth daily.    Marland Kitchen HYDROcodone-acetaminophen (NORCO) 10-325 MG tablet Take 1 tablet by mouth every 6 (six) hours as needed for severe pain.    Marland Kitchen losartan (COZAAR) 100 MG tablet Take 100 mg by mouth daily.    . metoprolol succinate (TOPROL XL) 25 MG 24 hr tablet Take 1 tablet (25 mg total) by mouth daily. 30 tablet 6  . potassium chloride (K-DUR) 10 MEQ tablet Take 1 tablet (10 mEq total) daily for 10 days by mouth. 10  tablet 0   No facility-administered medications prior to visit.      Allergies:   Patient has no known allergies.   Social History   Socioeconomic History  . Marital status: Married    Spouse name: None  . Number of children: None  . Years of education: None  . Highest education level: None  Social Needs  . Financial resource strain: None  . Food insecurity - worry: None  . Food insecurity - inability: None  . Transportation needs - medical: None  . Transportation needs - non-medical: None  Occupational History  . None  Tobacco Use  . Smoking status: Current Some Day Smoker    Packs/day: 0.50    Years: 10.00    Pack years: 5.00    Types: Cigars    Last attempt to quit: 12/01/1986    Years since quitting: 30.2  . Smokeless tobacco: Never Used  . Tobacco comment: Quit x 20 plus years  Substance and Sexual Activity  . Alcohol use: Yes    Alcohol/week: 0.0 oz    Comment: a beer 1-2 times a week  . Drug use: No    Comment: remote marijuana use   . Sexual activity: Yes  Other Topics Concern  . 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   Review of Systems:   Please see the history of present illness.     General:  No chills, fever, night sweats or weight changes.  Cardiovascular:  No chest pain, dyspnea on exertion, edema, orthopnea, paroxysmal nocturnal dyspnea. Positive for palpitations (now resolved).  Dermatological: No rash, lesions/masses Respiratory: No cough, dyspnea Urologic: No hematuria, dysuria Abdominal:   No nausea, vomiting, diarrhea, bright red blood per rectum, melena, or hematemesis Neurologic:  No visual changes, wkns, changes in mental status. All other systems reviewed and are otherwise negative except as noted above.   Physical Exam:    VS:  BP (!) 150/98   Pulse 85   Ht 5\' 6"  (1.676 m)    Wt 207 lb (93.9 kg)   SpO2 98%   BMI 33.41 kg/m    General: Well developed, well nourished [place] American male appearing in no acute distress. Head: Normocephalic, atraumatic, sclera non-icteric, no xanthomas, nares are without discharge.  Neck: No carotid bruits. JVD not elevated.  Lungs: Respirations regular and unlabored, without wheezes or rales.  Heart: Regular rate and rhythm. No S3 or S4.  No murmur, no rubs, or gallops appreciated. Abdomen: Soft, non-tender, non-distended with normoactive bowel sounds. No hepatomegaly. No rebound/guarding. No obvious abdominal masses. Msk:  Strength and tone appear normal for age. No joint deformities  or effusions. Extremities: No clubbing or cyanosis. No lower extremity edema.  Distal pedal pulses are 2+ bilaterally. Neuro: Alert and oriented X 3. Moves all extremities spontaneously. No focal deficits noted. Psych:  Responds to questions appropriately with a normal affect. Skin: No rashes or lesions noted  Wt Readings from Last 3 Encounters:  03/01/17 207 lb (93.9 kg)  02/15/17 208 lb (94.3 kg)  02/11/17 204 lb (92.5 kg)     Studies/Labs Reviewed:   EKG:  EKG is ordered today.  The ekg ordered today demonstrates NSR, HR 84, with TWI along V4-V5 (similar to prior tracings).   Recent Labs: 02/15/2017: BUN 13; Creatinine, Ser 1.42; Hemoglobin 15.6; Magnesium 1.7; Platelets 280; Potassium 3.0; Sodium 139; TSH 0.509   Lipid Panel No results found for: CHOL, TRIG, HDL, CHOLHDL, VLDL, LDLCALC, LDLDIRECT  Additional studies/ records that were reviewed today include:   Echocardiogram: 04/2016 Study Conclusions  - Left ventricle: The cavity size was normal. Wall thickness was   increased in a pattern of moderate LVH. Systolic function was   normal. The estimated ejection fraction was in the range of 55%   to 60%. Wall motion was normal; there were no regional wall   motion abnormalities. Diastolic dysfunction, grade indeterminate. - Left  atrium: The atrium was mildly to moderately dilated.  AV Nodal Ablation: 12/15/2016 Ablation:  A 40F BW 4mm ablation catheter was therefore advanced through the right femoral vein and advanced into the right atrium. Mapping of Koch's triangle was performed which revealed a small sized triangle. A single lesion was delivered at site 8 in Koch's triangle with a target temperature of 60 degrees of 50 watts for 60 seconds. Good accelerated junctional rhythm was observed with intact VA conduction.    Measurements following ablation:  Following ablation, Rapid atrial pacing was again performed with PR<RR and an AV WCL of 390 msec. AEST was performed which revealed a single AH jump with an occasional single echo beat and no tachycardia observed. The Atrial ERP was 500/260 msec. V pacing was performed which revealed midline concentric decremental VA conduction with no retrograde jump, no echo beats, and no tachycardias. The VA WCL was 310 msec and a VERP 500/32msec.  Isuprel was again infused at 4 mcg/min with an adequate acceleration in heart rate response. RAP revealed PR<RR with an AVWCL of 310 msec.  Atrial pacing was continued on isuprel down to a CL of 200 msec with no arrhythmias induced. AEST was performed which revealed a single AH jumps with no echo beats and no tachycardias induced during isuprel.   Following ablation the AH interval was 72 msec with an HV interval of 38 msec. The procedure was therefore considered completed. All catheters were removed and the sheaths were aspirated and flushed. The sheaths were removed and hemostasis was assured. EBL<70ml. There were no early apparent complications.   CONCLUSIONS:  1. Sinus rhythm upon presentation.  2. The patient had dual AV nodal physiology with easily inducible nonsustained classic AV nodal reentrant tachycardia, there were no other accessory pathways or other arrhythmias induced  3. Successful radiofrequency modification of the slow AV nodal  pathway  4. No inducible arrhythmias following ablation.  5. No early apparent complications.   Assessment:    1. Paroxysmal atrial flutter ([place])   2. PSVT (paroxysmal supraventricular tachycardia) ([place])   3. Essential hypertension   4. Hypokalemia      Plan:   In order of problems listed above:  1. Paroxysmal Atrial Flutter - the  patient was recently evaluated at [facility] ED and found to be in atrial flutter with HR in the 150's. Labs showed he was hypokalemic but troponin was negative and TSH was stable at 0.509. He experienced spontaneous conversion to NSR and was not started on anticoagulation at that time as his CHA2DS2-VASc Score is 1 and he is undergoing GI evaluation for heme-positive stools (reports having an episode of hematochezia yesterday). Would need to consider anticoagulation if found to have recurrent episodes of PAF and cleared by GI.  - he denies any recurrent dyspnea or palpitations. Is maintaining NSR by EKG today. Will continue on Toprol-XL 25mg  daily (reports having fatigue with higher dosing). I informed him he can take an additional tablet as needed for palpitations. Importance of limiting caffeine and alcohol intake was reviewed.   2. pSVT - s/p ablation in 12/2016. Remains on Toprol-XL 25mg  daily.   3. HTN - BP is elevated at 150/98 during today's visit. Notes being under increased stress over the past several days. Will continue current medication regimen of HCTZ 12.5mg  daily, Losartan 100mg  daily, and Toprol-XL 25mg  daily. Encouraged the patient to check his BP regularly at home and to report back if BP remains elevated.   4. Hypokalemia - K+ was 3.0 during his recent ED visit. He did not start K+ supplementation but has increased his intake of K+ rich foods. Will recheck BMET today. If K+ still low, will need to start K-dur.     Medication Adjustments/Labs and Tests Ordered: Current medicines are reviewed at length with the patient today.  Concerns  regarding medicines are outlined above.  Medication changes, Labs and Tests ordered today are listed in the Patient Instructions below. Patient Instructions  Medication Instructions:  Your physician recommends that you continue on your current medications as directed. Please refer to the Current Medication list given to you today.   Labwork: Your physician recommends that you return for lab work in: Today   Testing/Procedures: NONE   Follow-Up: Your physician recommends that you schedule a follow-up appointment in: 3 Months with Dr. Domenic Polite  Any Other Special Instructions Will Be Listed Below (If Applicable). You may take an extra Metoprolol for palpitations if needed   If you need a refill on your cardiac medications before your next appointment, please call your pharmacy.  Thank you for choosing [facility]!    Signed, Erma Heritage, PA-C  03/01/2017 2:12 PM    [facility] [address], [place], [place]  [address] Phone: [phone]; Fax: [phone]  [address] [place], [place] [address] Phone: [phone]

## 2017-03-01 ENCOUNTER — Encounter: Payer: Self-pay | Admitting: Student

## 2017-03-01 ENCOUNTER — Other Ambulatory Visit (HOSPITAL_COMMUNITY)
Admission: RE | Admit: 2017-03-01 | Discharge: 2017-03-01 | Disposition: A | Discharge status: Home / Self Care | Payer: Self-pay | Source: Ambulatory Visit | Attending: Student | Admitting: Student

## 2017-03-01 ENCOUNTER — Ambulatory Visit (INDEPENDENT_AMBULATORY_CARE_PROVIDER_SITE_OTHER): Payer: Self-pay | Admitting: Student

## 2017-03-01 VITALS — BP 150/98 | HR 85 | Ht 66.0 in | Wt 207.0 lb

## 2017-03-01 DIAGNOSIS — I10 Essential (primary) hypertension: Secondary | ICD-10-CM

## 2017-03-01 DIAGNOSIS — I471 Supraventricular tachycardia, unspecified: Secondary | ICD-10-CM

## 2017-03-01 DIAGNOSIS — E876 Hypokalemia: Secondary | ICD-10-CM | POA: Insufficient documentation

## 2017-03-01 DIAGNOSIS — I4892 Unspecified atrial flutter: Secondary | ICD-10-CM

## 2017-03-01 LAB — BASIC METABOLIC PANEL
Anion gap: 8 (ref 5–15)
BUN: 12 mg/dL (ref 6–20)
CO2: 26 mmol/L (ref 22–32)
Calcium: 9 mg/dL (ref 8.9–10.3)
Chloride: 102 mmol/L (ref 101–111)
Creatinine, Ser: 1.43 mg/dL — ABNORMAL HIGH (ref 0.61–1.24)
GFR calc Af Amer: >60 mL/min (ref >60)
GFR calc non Af Amer: 53 mL/min — ABNORMAL LOW (ref >60)
Glucose, Bld: 90 mg/dL (ref 65–99)
Potassium: 3.9 mmol/L (ref 3.5–5.1)
Sodium: 136 mmol/L (ref 135–145)

## 2017-03-01 NOTE — Patient Instructions (Signed)
Medication Instructions:  Your physician recommends that you continue on your current medications as directed. Please refer to the Current Medication list given to you today.   Labwork: Your physician recommends that you return for lab work in: Today    Testing/Procedures: NONE   Follow-Up: Your physician recommends that you schedule a follow-up appointment in: 3 Months with Dr. Domenic Polite   Any Other Special Instructions Will Be Listed Below (If Applicable). You may take an extra Metoprolol for palpitations if needed     If you need a refill on your cardiac medications before your next appointment, please call your pharmacy.  Thank you for choosing [facility]!

## 2017-03-18 ENCOUNTER — Other Ambulatory Visit: Payer: Self-pay

## 2017-03-18 ENCOUNTER — Encounter (HOSPITAL_COMMUNITY)
Admission: RE | Disposition: A | Discharge status: Home / Self Care | Payer: Self-pay | Source: Ambulatory Visit | Attending: Internal Medicine

## 2017-03-18 ENCOUNTER — Encounter (HOSPITAL_COMMUNITY): Payer: Self-pay | Admitting: *Deleted

## 2017-03-18 ENCOUNTER — Ambulatory Visit (HOSPITAL_COMMUNITY)
Admission: RE | Admit: 2017-03-18 | Discharge: 2017-03-18 | Disposition: A | Discharge status: Home / Self Care | Payer: Self-pay | Source: Ambulatory Visit | Attending: Internal Medicine | Admitting: Internal Medicine

## 2017-03-18 DIAGNOSIS — Z86010 Personal history of colonic polyps: Secondary | ICD-10-CM | POA: Insufficient documentation

## 2017-03-18 DIAGNOSIS — Z7982 Long term (current) use of aspirin: Secondary | ICD-10-CM | POA: Insufficient documentation

## 2017-03-18 DIAGNOSIS — K219 Gastro-esophageal reflux disease without esophagitis: Secondary | ICD-10-CM | POA: Insufficient documentation

## 2017-03-18 DIAGNOSIS — I471 Supraventricular tachycardia: Secondary | ICD-10-CM | POA: Insufficient documentation

## 2017-03-18 DIAGNOSIS — R195 Other fecal abnormalities: Secondary | ICD-10-CM

## 2017-03-18 DIAGNOSIS — Z79899 Other long term (current) drug therapy: Secondary | ICD-10-CM | POA: Insufficient documentation

## 2017-03-18 DIAGNOSIS — I10 Essential (primary) hypertension: Secondary | ICD-10-CM | POA: Insufficient documentation

## 2017-03-18 DIAGNOSIS — K5730 Diverticulosis of large intestine without perforation or abscess without bleeding: Secondary | ICD-10-CM | POA: Insufficient documentation

## 2017-03-18 DIAGNOSIS — K921 Melena: Secondary | ICD-10-CM | POA: Insufficient documentation

## 2017-03-18 DIAGNOSIS — F17290 Nicotine dependence, other tobacco product, uncomplicated: Secondary | ICD-10-CM | POA: Insufficient documentation

## 2017-03-18 HISTORY — PX: COLONOSCOPY: SHX5424

## 2017-03-18 SURGERY — COLONOSCOPY
Anesthesia: Moderate Sedation

## 2017-03-18 MED ORDER — MEPERIDINE HCL 100 MG/ML IJ SOLN
INTRAMUSCULAR | Status: AC
  Filled 2017-03-18: qty 2

## 2017-03-18 MED ORDER — ONDANSETRON HCL 4 MG/2ML IJ SOLN
INTRAMUSCULAR | Status: DC | PRN
  Administered 2017-03-18: 4 mg via INTRAVENOUS

## 2017-03-18 MED ORDER — PROMETHAZINE HCL 25 MG/ML IJ SOLN
INTRAMUSCULAR | Status: AC
  Filled 2017-03-18: qty 1

## 2017-03-18 MED ORDER — MEPERIDINE HCL 100 MG/ML IJ SOLN
INTRAMUSCULAR | Status: DC | PRN
  Administered 2017-03-18: 25 mg via INTRAVENOUS
  Administered 2017-03-18: 50 mg via INTRAVENOUS

## 2017-03-18 MED ORDER — SODIUM CHLORIDE 0.9% FLUSH
INTRAVENOUS | Status: AC
  Filled 2017-03-18: qty 10

## 2017-03-18 MED ORDER — MIDAZOLAM HCL 5 MG/5ML IJ SOLN
INTRAMUSCULAR | Status: AC
  Filled 2017-03-18: qty 10

## 2017-03-18 MED ORDER — PROMETHAZINE HCL 25 MG/ML IJ SOLN
12.5000 mg | Freq: Once | INTRAMUSCULAR | Status: AC
  Administered 2017-03-18: 12.5 mg via INTRAVENOUS

## 2017-03-18 MED ORDER — STERILE WATER FOR IRRIGATION IR SOLN
Status: DC | PRN
  Administered 2017-03-18: 12:00:00

## 2017-03-18 MED ORDER — MIDAZOLAM HCL 5 MG/5ML IJ SOLN
INTRAMUSCULAR | Status: DC | PRN
  Administered 2017-03-18: 2 mg via INTRAVENOUS
  Administered 2017-03-18: 1 mg via INTRAVENOUS

## 2017-03-18 MED ORDER — SODIUM CHLORIDE 0.9 % IV SOLN
INTRAVENOUS | Status: DC
Start: 2017-03-18 — End: 2017-03-18
  Administered 2017-03-18: 11:00:00 via INTRAVENOUS

## 2017-03-18 MED ORDER — ONDANSETRON HCL 4 MG/2ML IJ SOLN
INTRAMUSCULAR | Status: AC
  Filled 2017-03-18: qty 2

## 2017-03-18 NOTE — Discharge Instructions (Signed)
Diverticulosis Diverticulosis is a condition that develops when small pouches (diverticula) form in the wall of the large intestine (colon). The colon is where water is absorbed and stool is formed. The pouches form when the inside layer of the colon pushes through weak spots in the outer layers of the colon. You may have a few pouches or many of them. What are the causes? The cause of this condition is not known. What increases the risk? The following factors may make you more likely to develop this condition:  Being older than age 27. Your risk for this condition increases with age. Diverticulosis is rare among people younger than age 36. By age 28, many people have it.  Eating a low-fiber diet.  Having frequent constipation.  Being overweight.  Not getting enough exercise.  Smoking.  Taking over-the-counter pain medicines, like aspirin and ibuprofen.  Having a family history of diverticulosis.  What are the signs or symptoms? In most people, there are no symptoms of this condition. If you do have symptoms, they may include:  Bloating.  Cramps in the abdomen.  Constipation or diarrhea.  Pain in the lower left side of the abdomen.  How is this diagnosed? This condition is most often diagnosed during an exam for other colon problems. Because diverticulosis usually has no symptoms, it often cannot be diagnosed independently. This condition may be diagnosed by:  Using a flexible scope to examine the colon (colonoscopy).  Taking an X-ray of the colon after dye has been put into the colon (barium enema).  Doing a CT scan.  How is this treated? You may not need treatment for this condition if you have never developed an infection related to diverticulosis. If you have had an infection before, treatment may include:  Eating a high-fiber diet. This may include eating more fruits, vegetables, and grains.  Taking a fiber supplement.  Taking a live bacteria supplement  (probiotic).  Taking medicine to relax your colon.  Taking antibiotic medicines.  Follow these instructions at home:  Drink 6-8 glasses of water or more each day to prevent constipation.  Try not to strain when you have a bowel movement.  If you have had an infection before: ? Eat more fiber as directed by your health care provider or your diet and nutrition specialist (dietitian). ? Take a fiber supplement or probiotic, if your health care provider approves.  Take over-the-counter and prescription medicines only as told by your health care provider.  If you were prescribed an antibiotic, take it as told by your health care provider. Do not stop taking the antibiotic even if you start to feel better.  Keep all follow-up visits as told by your health care provider. This is important. Contact a health care provider if:  You have pain in your abdomen.  You have bloating.  You have cramps.  You have not had a bowel movement in 3 days. Get help right away if:  Your pain gets worse.  Your bloating becomes very bad.  You have a fever or chills, and your symptoms suddenly get worse.  You vomit.  You have bowel movements that are bloody or black.  You have bleeding from your rectum. Summary  Diverticulosis is a condition that develops when small pouches (diverticula) form in the wall of the large intestine (colon).  You may have a few pouches or many of them.  This condition is most often diagnosed during an exam for other colon problems.  If you have had an  infection related to diverticulosis, treatment may include increasing the fiber in your diet, taking supplements, or taking medicines. This information is not intended to replace advice given to you by your health care provider. Make sure you discuss any questions you have with your health care provider. Document Released: 12/26/2003 Document Revised: 02/17/2016 Document Reviewed: 02/17/2016 Elsevier Interactive  Patient Education  2017 [place].  Colonoscopy Discharge Instructions  Read the instructions outlined below and refer to this sheet in the next few weeks. These discharge instructions provide you with general information on caring for yourself after you leave the hospital. Your doctor may also give you specific instructions. While your treatment has been planned according to the most current medical practices available, unavoidable complications occasionally occur. If you have any problems or questions after discharge, call Dr. Gala Romney at [phone]. ACTIVITY  You may resume your regular activity, but move at a slower pace for the next 24 hours.   Take frequent rest periods for the next 24 hours.   Walking will help get rid of the air and reduce the bloated feeling in your belly (abdomen).   No driving for 24 hours (because of the medicine (anesthesia) used during the test).    Do not sign any important legal documents or operate any machinery for 24 hours (because of the anesthesia used during the test).  NUTRITION  Drink plenty of fluids.   You may resume your normal diet as instructed by your doctor.   Begin with a light meal and progress to your normal diet. Heavy or fried foods are harder to digest and may make you feel sick to your stomach (nauseated).   Avoid alcoholic beverages for 24 hours or as instructed.  MEDICATIONS  You may resume your normal medications unless your doctor tells you otherwise.  WHAT YOU CAN EXPECT TODAY  Some feelings of bloating in the abdomen.   Passage of more gas than usual.   Spotting of blood in your stool or on the toilet paper.  IF YOU HAD POLYPS REMOVED DURING THE COLONOSCOPY:  No aspirin products for 7 days or as instructed.   No alcohol for 7 days or as instructed.   Eat a soft diet for the next 24 hours.  FINDING OUT THE RESULTS OF YOUR TEST Not all test results are available during your visit. If your test results are not back  during the visit, make an appointment with your caregiver to find out the results. Do not assume everything is normal if you have not heard from your caregiver or the medical facility. It is important for you to follow up on all of your test results.  SEEK IMMEDIATE MEDICAL ATTENTION IF:  You have more than a spotting of blood in your stool.   Your belly is swollen (abdominal distention).   You are nauseated or vomiting.   You have a temperature over 101.   You have abdominal pain or discomfort that is severe or gets worse throughout the day.    Diverticulosis information provided  Recommend repeat colonoscopy in 5 years   Diverticulosis Diverticulosis is a condition that develops when small pouches (diverticula) form in the wall of the large intestine (colon). The colon is where water is absorbed and stool is formed. The pouches form when the inside layer of the colon pushes through weak spots in the outer layers of the colon. You may have a few pouches or many of them. What are the causes? The cause of this condition is  not known. What increases the risk? The following factors may make you more likely to develop this condition:  Being older than age 19. Your risk for this condition increases with age. Diverticulosis is rare among people younger than age 42. By age 12, many people have it.  Eating a low-fiber diet.  Having frequent constipation.  Being overweight.  Not getting enough exercise.  Smoking.  Taking over-the-counter pain medicines, like aspirin and ibuprofen.  Having a family history of diverticulosis.  What are the signs or symptoms? In most people, there are no symptoms of this condition. If you do have symptoms, they may include:  Bloating.  Cramps in the abdomen.  Constipation or diarrhea.  Pain in the lower left side of the abdomen.  How is this diagnosed? This condition is most often diagnosed during an exam for other colon problems. Because  diverticulosis usually has no symptoms, it often cannot be diagnosed independently. This condition may be diagnosed by:  Using a flexible scope to examine the colon (colonoscopy).  Taking an X-ray of the colon after dye has been put into the colon (barium enema).  Doing a CT scan.  How is this treated? You may not need treatment for this condition if you have never developed an infection related to diverticulosis. If you have had an infection before, treatment may include:  Eating a high-fiber diet. This may include eating more fruits, vegetables, and grains.  Taking a fiber supplement.  Taking a live bacteria supplement (probiotic).  Taking medicine to relax your colon.  Taking antibiotic medicines.  Follow these instructions at home:  Drink 6-8 glasses of water or more each day to prevent constipation.  Try not to strain when you have a bowel movement.  If you have had an infection before: ? Eat more fiber as directed by your health care provider or your diet and nutrition specialist (dietitian). ? Take a fiber supplement or probiotic, if your health care provider approves.  Take over-the-counter and prescription medicines only as told by your health care provider.  If you were prescribed an antibiotic, take it as told by your health care provider. Do not stop taking the antibiotic even if you start to feel better.  Keep all follow-up visits as told by your health care provider. This is important. Contact a health care provider if:  You have pain in your abdomen.  You have bloating.  You have cramps.  You have not had a bowel movement in 3 days. Get help right away if:  Your pain gets worse.  Your bloating becomes very bad.  You have a fever or chills, and your symptoms suddenly get worse.  You vomit.  You have bowel movements that are bloody or black.  You have bleeding from your rectum. Summary  Diverticulosis is a condition that develops when small  pouches (diverticula) form in the wall of the large intestine (colon).  You may have a few pouches or many of them.  This condition is most often diagnosed during an exam for other colon problems.  If you have had an infection related to diverticulosis, treatment may include increasing the fiber in your diet, taking supplements, or taking medicines. This information is not intended to replace advice given to you by your health care provider. Make sure you discuss any questions you have with your health care provider.

## 2017-03-18 NOTE — Op Note (Signed)
[facility] Patient Name: Calvin Henry Procedure Date: 03/18/2017 10:55 AM MRN: [id number] Date of Birth: 11-22-1960 Attending MD: Gennette Pac , MD CSN: [id number] Age: 56 Admit Type: Inpatient Procedure:                Colonoscopy Indications:              Hematochezia Providers:                Gennette Pac, MD, Criselda Peaches. Patsy Lager, RN,                            Edythe Clarity, Technician, Dyann Ruddle Referring MD:              Medicines:                Midazolam 3 mg IV, Meperidine 75 mg IV,                            Promethazine 12.5 mg IV, Ondansetron 4 mg IV Complications:            No immediate complications. Estimated Blood Loss:     Estimated blood loss: none. Procedure:                Pre-Anesthesia Assessment:                           - Prior to the procedure, a History and Physical                            was performed, and patient medications and                            allergies were reviewed. The patient's tolerance of                            previous anesthesia was also reviewed. The risks                            and benefits of the procedure and the sedation                            options and risks were discussed with the patient.                            All questions were answered, and informed consent                            was obtained. Prior Anticoagulants: The patient has                            taken no previous anticoagulant or antiplatelet                            agents. ASA Grade Assessment: II - A patient with  mild systemic disease. After reviewing the risks                            and benefits, the patient was deemed in                            satisfactory condition to undergo the procedure.                           After obtaining informed consent, the colonoscope                            was passed under direct vision. Throughout the                             procedure, the patient's blood pressure, pulse, and                            oxygen saturations were monitored continuously. The                            EC-[id number] ([id number]) scope was introduced through                            the anus and advanced to the the cecum, identified                            by appendiceal orifice and ileocecal valve. The                            colonoscopy was performed without difficulty. The                            patient tolerated the procedure well. The quality                            of the bowel preparation was adequate. The                            ileocecal valve, appendiceal orifice, and rectum                            were photographed. The entire colon was well                            visualized. Scope In: 12:11:10 PM Scope Out: 12:19:39 PM Scope Withdrawal Time: 0 hours 6 minutes 23 seconds  Total Procedure Duration: 0 hours 8 minutes 29 seconds  Findings:      The perianal and digital rectal examinations were normal.      Scattered small and large-mouthed diverticula were found in the entire       colon. Slightly friable anal canal. Distal rectal scar consistent with       prior hemorrhoid banding.      The exam was otherwise without abnormality  on direct and retroflexion       views. Impression:               - Diverticulosis in the entire examined colon.                           - The examination was otherwise normal on direct                            and retroflexion views. Friable anal canal.                           - No specimens collected. Moderate Sedation:      Moderate (conscious) sedation was administered by the endoscopy nurse       and supervised by the endoscopist. The following parameters were       monitored: oxygen saturation, heart rate, blood pressure, respiratory       rate, EKG, adequacy of pulmonary ventilation, and response to care.       Total physician intraservice time was 12  minutes. Recommendation:           - Patient has a contact number available for                            emergencies. The signs and symptoms of potential                            delayed complications were discussed with the                            patient. Return to normal activities tomorrow.                            Written discharge instructions were provided to the                            patient.                           - Resume previous diet.                           - Continue present medications.                           - Repeat colonoscopy in 5 years for surveillance.                            No further GI workup unless ongoing signs/symptoms                            of GI bleeding.                           - Return to GI clinic PRN. Procedure Code(s):        --- Professional ---  [phone], Colonoscopy, flexible; diagnostic, including                            collection of specimen(s) by brushing or washing,                            when performed (separate procedure)                           99152, Moderate sedation services provided by the                            same physician or other qualified health care                            professional performing the diagnostic or                            therapeutic service that the sedation supports,                            requiring the presence of an independent trained                            observer to assist in the monitoring of the                            patient's level of consciousness and physiological                            status; initial 15 minutes of intraservice time,                            patient age 8 years or older Diagnosis Code(s):        --- Professional ---                           K92.1, Melena (includes Hematochezia)                           K57.30, Diverticulosis of large intestine without                            perforation or  abscess without bleeding CPT copyright 2016 American Medical Association. All rights reserved. The codes documented in this report are preliminary and upon coder review may  be revised to meet current compliance requirements. [name]. Winfrey Chillemi, MD Gennette Pac, MD 03/18/2017 12:25:28 PM This report has been signed electronically. Number of Addenda: 0

## 2017-03-18 NOTE — H&P (Signed)
@LOGO @   Primary Care Physician:  [name], PA-C Primary Gastroenterologist:  Dr. Gala Romney  Pre-Procedure History & Physical: HPI:  Calvin Henry is a 56 y.o. male here for further evaluation of Hemoccult positive stool. History of colonic polyps.  Past Medical History:  Diagnosis Date  . Acid reflux   . Arthritis   . Atrial flutter ([place])    a. diagnosed in 02/2017 with spontaneous conversion back to NSR.   Marland Kitchen Chronic back pain   . Essential hypertension   . H. pylori infection    Treated with Prevpac  . Hemorrhoids   . PSVT (paroxysmal supraventricular tachycardia) ([place])    a. s/p ablation in 12/2016  . Tubular adenoma     Past Surgical History:  Procedure Laterality Date  . COLONOSCOPY  08/07/10   friable anal canal otherwise normal  . COLONOSCOPY WITH ESOPHAGOGASTRODUODENOSCOPY (EGD) [name] 07/06/2013   Dr. Gala Romney- inflamed hemorrhoids- multiple colonic polyps= tubular adenoma. stomach bx= hpylori treated with prevpac  . ESOPHAGOGASTRODUODENOSCOPY  08/07/10   schatzkis ring otherwise normal, s/p 56-F dilation, small hiatal hernia.chronic active gastritis on bx  . HEMORRHOID BANDING    . HERNIA REPAIR    . SVT ABLATION [name] 12/15/2016   Procedure: SVT Ablation;  Surgeon: [name], MD;  Location: [facility];  Service: Cardiovascular;  Laterality: [name];  . TOTAL HIP ARTHROPLASTY Left 10/04/2015   Procedure: LEFT TOTAL HIP ARTHROPLASTY ANTERIOR APPROACH;  Surgeon: Mcarthur Rossetti, MD;  [facility]: [facility];  Service: Orthopedics;  Laterality: Left;  . UMBILICAL HERNIA REPAIR  08/05/2011   Procedure: HERNIA REPAIR UMBILICAL ADULT;  Surgeon: Jamesetta So, MD;  Location: [facility];  Service: General;  Laterality: [name];    Prior to Admission medications   Medication Sig Start Date End Date Taking? Authorizing Provider  aspirin EC 81 MG tablet Take 81 mg by mouth daily.   Yes [provider]  ibuprofen (ADVIL,MOTRIN) 200 MG tablet Take 400 mg by mouth  daily as needed for headache or moderate pain.   Yes [provider]  losartan (COZAAR) 100 MG tablet Take 100 mg by mouth daily.   Yes [provider]  metoprolol succinate (TOPROL XL) 25 MG 24 hr tablet Take 1 tablet (25 mg total) by mouth daily. Patient taking differently: Take 25 mg by mouth daily. May take an additional 25 mg as needed for palpitations 02/08/17  Yes Satira Sark, MD  oxyCODONE-acetaminophen (PERCOCET) 10-325 MG tablet Take 1 tablet by mouth every 4 (four) hours as needed for pain.   Yes [provider]    Allergies as of 02/11/2017  . (No Known Allergies)    Family History  Problem Relation Age of Onset  . Diabetes Mother   . Hypertension Mother   . Colon cancer Neg Hx     Social History   Socioeconomic History  . Marital status: Married    Spouse name: Not on file  . Number of children: Not on file  . Years of education: Not on file  . Highest education level: Not on file  Social Needs  . Financial resource strain: Not on file  . Food insecurity - worry: Not on file  . Food insecurity - inability: Not on file  . Transportation needs - medical: Not on file  . Transportation needs - non-medical: Not on file  Occupational History  . Not on file  Tobacco Use  . Smoking status: Current Some Day Smoker    Packs/day:  0.50    Years: 10.00    Pack years: 5.00    Types: Cigars    Last attempt to quit: 12/01/1986    Years since quitting: 30.3  . Smokeless tobacco: Never Used  . Tobacco comment: Quit x 20 plus years  Substance and Sexual Activity  . Alcohol use: Yes    Alcohol/week: 0.0 oz    Comment: a beer 1-2 times a week  . Drug use: No    Comment: remote marijuana use   . Sexual activity: Yes  Other Topics Concern  . 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 No significant cervical adenopathy. Lungs:  Clear throughout to auscultation.   No wheezes, crackles, or rhonchi. No acute distress. Heart:  Regular rate and rhythm; no murmurs, clicks, rubs,  or gallops. Abdomen: Non-distended, normal bowel sounds.  Soft and nontender without appreciable mass or hepatosplenomegaly.    Impression:  Pleasant 56 year old gentleman history of colonic adenomas. Recently referred for Hemoccult positive stool. Patient needs further evaluation via colonoscopy.  Recommendations:  I have offered the patient a diagnostic colonoscopy today.  The risks, benefits, limitations, alternatives and imponderables have been reviewed with the patient. Questions have been answered. All parties are agreeable.    Notice: This dictation was prepared with Dragon dictation along with smaller phrase technology. Any transcriptional errors that result from this process are unintentional and may not be corrected upon review.

## 2017-03-18 NOTE — Progress Notes (Signed)
Due to a medical procedure, Calvin Henry may not drive or operate machinery until after 1:00 PM on 03/19/2016.

## 2017-03-23 ENCOUNTER — Encounter (HOSPITAL_COMMUNITY): Payer: Self-pay | Admitting: Internal Medicine

## 2017-04-26 ENCOUNTER — Ambulatory Visit (HOSPITAL_COMMUNITY)
Admission: RE | Admit: 2017-04-26 | Discharge: 2017-04-26 | Disposition: A | Discharge status: Home / Self Care | Payer: BLUE CROSS/BLUE SHIELD | Source: Ambulatory Visit | Attending: Family Medicine | Admitting: Family Medicine

## 2017-04-26 ENCOUNTER — Other Ambulatory Visit (HOSPITAL_COMMUNITY): Payer: Self-pay | Admitting: Family Medicine

## 2017-04-26 DIAGNOSIS — K219 Gastro-esophageal reflux disease without esophagitis: Secondary | ICD-10-CM | POA: Insufficient documentation

## 2017-04-26 DIAGNOSIS — Z1389 Encounter for screening for other disorder: Secondary | ICD-10-CM | POA: Diagnosis not present

## 2017-06-07 ENCOUNTER — Ambulatory Visit: Payer: Self-pay | Admitting: Cardiology

## 2017-08-09 ENCOUNTER — Ambulatory Visit: Payer: Self-pay | Admitting: Cardiology

## 2017-08-09 NOTE — Progress Notes (Deleted)
Cardiology Office Note  Date: 08/09/2017   ID: Calvin Henry, DOB 1960-06-04, MRN [id number]  PCP: Scherrie Bateman  Primary Cardiologist: Rozann Lesches, MD   No chief complaint on file.   History of Present Illness: Calvin Henry is a 57 y.o. male last seen by Ms. Strader PA-C in November 2018.  Cardiac history includes PSVT status post previous radiofrequency ablation, also paroxysmal atrial flutter that was documented in November 2018 with spontaneous conversion to sinus rhythm.  CHADSVASC score is 1.  He was not anticoagulated after this single episode, particularly in light of an ongoing work-up for heme positive stools at that time.  Past Medical History:  Diagnosis Date  . Acid reflux   . Arthritis   . Atrial flutter ([place])    a. diagnosed in 02/2017 with spontaneous conversion back to NSR.   Marland Kitchen Chronic back pain   . Essential hypertension   . H. pylori infection    Treated with Prevpac  . Hemorrhoids   . PSVT (paroxysmal supraventricular tachycardia) ([place])    a. s/p ablation in 12/2016  . Tubular adenoma     Past Surgical History:  Procedure Laterality Date  . COLONOSCOPY  08/07/10   friable anal canal otherwise normal  . COLONOSCOPY [name] 03/18/2017   Procedure: COLONOSCOPY;  Surgeon: Daneil Dolin, MD;  [facility];  Service: Endoscopy;  Laterality: [name];  1:15pm  . COLONOSCOPY WITH ESOPHAGOGASTRODUODENOSCOPY (EGD) [name] 07/06/2013   Dr. Gala Romney- inflamed hemorrhoids- multiple colonic polyps= tubular adenoma. stomach bx= hpylori treated with prevpac  . ESOPHAGOGASTRODUODENOSCOPY  08/07/10   schatzkis ring otherwise normal, s/p 56-F dilation, small hiatal hernia.chronic active gastritis on bx  . HEMORRHOID BANDING    . HERNIA REPAIR    . SVT ABLATION [name] 12/15/2016   Procedure: SVT Ablation;  Surgeon: [name], MD;  Location: [facility];  Service: Cardiovascular;  Laterality: [name];  . TOTAL HIP ARTHROPLASTY Left 10/04/2015   Procedure: LEFT TOTAL HIP ARTHROPLASTY ANTERIOR APPROACH;  Surgeon: Mcarthur Rossetti, MD;  [facility];  Service: Orthopedics;  Laterality: Left;  . UMBILICAL HERNIA REPAIR  08/05/2011   Procedure: HERNIA REPAIR UMBILICAL ADULT;  Surgeon: Jamesetta So, MD;  Location: [facility];  Service: General;  Laterality: [name];    Current Outpatient Medications  Medication Sig Dispense Refill  . aspirin EC 81 MG tablet Take 81 mg by mouth daily.    Marland Kitchen ibuprofen (ADVIL,MOTRIN) 200 MG tablet Take 400 mg by mouth daily as needed for headache or moderate pain.    Marland Kitchen losartan (COZAAR) 100 MG tablet Take 100 mg by mouth daily.    . metoprolol succinate (TOPROL XL) 25 MG 24 hr tablet Take 1 tablet (25 mg total) by mouth daily. (Patient taking differently: Take 25 mg by mouth daily. May take an additional 25 mg as needed for palpitations) 30 tablet 6  . oxyCODONE-acetaminophen (PERCOCET) 10-325 MG tablet Take 1 tablet by mouth every 4 (four) hours as needed for pain.     No current facility-administered medications for this visit.    Allergies:  Patient has no known allergies.   Social History: The patient  reports that he has been smoking cigars.  He has a 5.00 pack-year smoking history. He has never used smokeless tobacco. He reports that he drinks alcohol. He reports that he does not use drugs.   Family History: The patient's family history includes Diabetes in his mother; Hypertension in his mother.  ROS:  Please see the history of present illness. Otherwise, complete review of systems is positive for {NONE DEFAULTED:18576::"none"}.  All other systems are reviewed and negative.   Physical Exam: VS:  There were no vitals taken for this visit., BMI There is no height or weight on file to calculate BMI.  Wt Readings from Last 3 Encounters:  03/18/17 207 lb (93.9 kg)  03/01/17 207 lb (93.9 kg)  02/15/17 208 lb (94.3 kg)    General: Patient appears comfortable at rest. HEENT: Conjunctiva and lids  normal, oropharynx clear with moist mucosa. Neck: Supple, no elevated JVP or carotid bruits, no thyromegaly. Lungs: Clear to auscultation, nonlabored breathing at rest. Cardiac: Regular rate and rhythm, no S3 or significant systolic murmur, no pericardial rub. Abdomen: Soft, nontender, no hepatomegaly, bowel sounds present, no guarding or rebound. Extremities: No pitting edema, distal pulses 2+. Skin: Warm and dry. Musculoskeletal: No kyphosis. Neuropsychiatric: Alert and oriented x3, affect grossly appropriate.  ECG: I personally reviewed the tracing from 03/01/2017 which showed sinus rhythm with anterolateral T wave inversions.  Recent Labwork: 02/15/2017: Hemoglobin 15.6; Magnesium 1.7; Platelets 280; TSH 0.509 03/01/2017: BUN 12; Creatinine, Ser 1.43; Potassium 3.9; Sodium 136   Other Studies Reviewed Today:  Echocardiogram 04/24/2016: Study Conclusions  - Left ventricle: The cavity size was normal. Wall thickness was   increased in a pattern of moderate LVH. Systolic function was   normal. The estimated ejection fraction was in the range of 55%   to 60%. Wall motion was normal; there were no regional wall   motion abnormalities. Diastolic dysfunction, grade indeterminate. - Left atrium: The atrium was mildly to moderately dilated.  Assessment and Plan:   Current medicines were reviewed with the patient today.  No orders of the defined types were placed in this encounter.   Disposition:  Signed, Satira Sark, MD, [facility] 08/09/2017 8:38 AM    [facility] [place]. [address] [place], [place] [address] Phone: [phone]; Fax: [phone]

## 2017-09-14 NOTE — Progress Notes (Signed)
Cardiology Office Note  Date: 09/17/2017   ID: Calvin Henry, DOB [birth year], MRN [id number]  PCP: Calvin Henry  Primary Cardiologist: Calvin Lesches, MD   Chief Complaint  Patient presents with  . Paroxysmal atrial flutter    History of Present Illness: Calvin Henry is a 57 y.o. male last seen in November 2018 by Ms. Strader PA-C.  He presents for a follow-up visit.  He was seen at [facility] ER on May 11 presenting with an episode of rapid palpitations.  I do not have complete information. What was provided indicated that he was in atrial fibrillation, but it does not sound like he was cardioverted and went home from the ER.  He recalls being given a medicine through his IV that made his heart rate return to normal.  His heart rate is regular today and he reports no change in feeling of intermittent palpitations.  He reports compliance with his medications including Toprol-XL 25 mg daily.  CHADSVASC score is 1.  He has had paroxysmal atrial flutter and also a history of PSVT with radiofrequency ablation in September of last year.   I reviewed his lab work which is outlined below.  Past Medical History:  Diagnosis Date  . Acid reflux   . Arthritis   . Atrial flutter ([place])    a. diagnosed in 02/2017 with spontaneous conversion back to NSR.   Marland Kitchen Chronic back pain   . Essential hypertension   . H. pylori infection    Treated with Prevpac  . Hemorrhoids   . PSVT (paroxysmal supraventricular tachycardia) ([place])    a. s/p ablation in 12/2016  . Tubular adenoma     Past Surgical History:  Procedure Laterality Date  . COLONOSCOPY  08/07/10   friable anal canal otherwise normal  . COLONOSCOPY [name] 03/18/2017   Procedure: COLONOSCOPY;  Surgeon: Calvin Dolin, MD;  [facility];  Service: Endoscopy;  Laterality: [name];  1:15pm  . COLONOSCOPY WITH ESOPHAGOGASTRODUODENOSCOPY (EGD) [name] 07/06/2013   Calvin Henry- inflamed hemorrhoids-  multiple colonic polyps= tubular adenoma. stomach bx= hpylori treated with prevpac  . ESOPHAGOGASTRODUODENOSCOPY  08/07/10   schatzkis ring otherwise normal, s/p 56-F dilation, small hiatal hernia.chronic active gastritis on bx  . HEMORRHOID BANDING    . HERNIA REPAIR    . SVT ABLATION [name] 12/15/2016   Procedure: SVT Ablation;  Surgeon: Calvin Grayer, MD;  Location: [facility];  Service: Cardiovascular;  Laterality: [name];  . TOTAL HIP ARTHROPLASTY Left 10/04/2015   Procedure: LEFT TOTAL HIP ARTHROPLASTY ANTERIOR APPROACH;  Surgeon: Calvin Rossetti, MD;  [facility];  Service: Orthopedics;  Laterality: Left;  . UMBILICAL HERNIA REPAIR  08/05/2011   Procedure: HERNIA REPAIR UMBILICAL ADULT;  Surgeon: Calvin So, MD;  Location: [facility];  Service: General;  Laterality: [name];    Current Outpatient Medications  Medication Sig Dispense Refill  . aspirin EC 81 MG tablet Take 81 mg by mouth daily.    Marland Kitchen ibuprofen (ADVIL,MOTRIN) 200 MG tablet Take 400 mg by mouth daily as needed for headache or moderate pain.    Marland Kitchen losartan (COZAAR) 100 MG tablet Take 100 mg by mouth daily.    . metoprolol succinate (TOPROL-XL) 50 MG 24 hr tablet Take 1 tablet (50 mg total) by mouth daily. 90 tablet 3  . oxyCODONE-acetaminophen (PERCOCET) 10-325 MG tablet Take 1 tablet by mouth every 4 (four) hours as needed for pain.     No  current facility-administered medications for this visit.    Allergies:  Patient has no known allergies.   Social History: The patient  reports that he has been smoking cigars.  He has a 5.00 pack-year smoking history. He has never used smokeless tobacco. He reports that he drinks alcohol. He reports that he does not use drugs.   ROS:  Please see the history of present illness. Otherwise, complete review of systems is positive for none.  All other systems are reviewed and negative.   Physical Exam: VS:  BP (!) 158/78   Pulse 84   Ht 5\' 6"  (1.676 m)   Wt 204 lb (92.5 kg)    SpO2 96%   BMI 32.93 kg/m , BMI Body mass index is 32.93 kg/m.  Wt Readings from Last 3 Encounters:  09/17/17 204 lb (92.5 kg)  03/18/17 207 lb (93.9 kg)  03/01/17 207 lb (93.9 kg)    General: Patient appears comfortable at rest. HEENT: Conjunctiva and lids normal, oropharynx clear. Neck: Supple, no elevated JVP or carotid bruits, no thyromegaly. Lungs: Clear to auscultation, nonlabored breathing at rest. Cardiac: Regular rate and rhythm, no S3 or significant systolic murmur, no pericardial rub. Abdomen: Soft, nontender, bowel sounds present. Extremities: No pitting edema, distal pulses 2+. Skin: Warm and dry. Musculoskeletal: No kyphosis. Neuropsychiatric: Alert and oriented x3, affect grossly appropriate.  ECG: I personally reviewed the tracing from 03/01/2017 which showed sinus rhythm with decreased R wave progression and nonspecific ST-T changes.  Recent Labwork: 02/15/2017: Hemoglobin 15.6; Magnesium 1.7; Platelets 280; TSH 0.509 03/01/2017: BUN 12; Creatinine, Ser 1.43; Potassium 3.9; Sodium 136  May 2019: Hemoglobin 16.2, platelets 238, BUN 12, creatinine 1.18, potassium 3.8, AST 48, ALT 45, troponin T less than 0.01  Other Studies Reviewed Today:  Echocardiogram 04/24/2016: Study Conclusions  - Left ventricle: The cavity size was normal. Wall thickness was   increased in a pattern of moderate LVH. Systolic function was   normal. The estimated ejection fraction was in the range of 55%   to 60%. Wall motion was normal; there were no regional wall   motion abnormalities. Diastolic dysfunction, grade indeterminate. - Left atrium: The atrium was mildly to moderately dilated.  Assessment and Plan:  1.  PSVT status post radiofrequency ablation in September 2018.  2.  Paroxysmal atrial flutter, documented in November 2018 during ER visit to [facility].  He spontaneously converted to sinus rhythm and has not been anticoagulated with relatively low thromboembolic  risk score.  He had a recent visit at [facility] ER in May with rapid palpitations and what was described as atrial fibrillation, although I do not have any ECGs for review as yet.  These are being requested.  For now we will increase Toprol-XL to 50 mg daily and continue aspirin.  3.  History of regular alcohol intake, moderation has been discussed.  4.  Essential hypertension, on Cozaar followed by PCP.  Current medicines were reviewed with the patient today.  Disposition: Follow-up in 3 months.  Signed, Satira Sark, MD, [facility] 09/17/2017 2:55 PM    [facility] [facility] [address] [place], [place] [address] Phone: [phone]; Fax: [phone]

## 2017-09-17 ENCOUNTER — Ambulatory Visit (INDEPENDENT_AMBULATORY_CARE_PROVIDER_SITE_OTHER): Payer: Self-pay | Admitting: Cardiology

## 2017-09-17 ENCOUNTER — Encounter: Payer: Self-pay | Admitting: Cardiology

## 2017-09-17 VITALS — BP 158/78 | HR 84 | Ht 66.0 in | Wt 204.0 lb

## 2017-09-17 DIAGNOSIS — Z7289 Other problems related to lifestyle: Secondary | ICD-10-CM

## 2017-09-17 DIAGNOSIS — I10 Essential (primary) hypertension: Secondary | ICD-10-CM

## 2017-09-17 DIAGNOSIS — Z789 Other specified health status: Secondary | ICD-10-CM

## 2017-09-17 DIAGNOSIS — I471 Supraventricular tachycardia, unspecified: Secondary | ICD-10-CM

## 2017-09-17 DIAGNOSIS — I4892 Unspecified atrial flutter: Secondary | ICD-10-CM

## 2017-09-17 MED ORDER — METOPROLOL SUCCINATE ER 50 MG PO TB24: 50.0000 mg | ORAL_TABLET | Freq: Every day | ORAL | 3 refills | Status: DC

## 2017-09-17 NOTE — Patient Instructions (Addendum)
Your physician wants you to follow-up in: 3 months with Dr Domenic Polite     INCREASE Toprol to 50 mg daily    All other medications stay the same   No lab work or tests ordered today.     If you need a refill on your cardiac medications before your next appointment, please call your pharmacy.      Thank you for choosing [facility] !

## 2017-12-27 ENCOUNTER — Ambulatory Visit: Payer: Self-pay | Admitting: Cardiology

## 2018-01-31 NOTE — Progress Notes (Deleted)
Cardiology Office Note  Date: 01/31/2018   ID: Calvin Henry, DOB May 29, 1960, MRN [id number]  PCP: Scherrie Bateman  Primary Cardiologist: Rozann Lesches, MD   No chief complaint on file.   History of Present Illness: Calvin Henry is a 57 y.o. male last seen in June.  Past Medical History:  Diagnosis Date  . Acid reflux   . Arthritis   . Atrial flutter ([place])    a. diagnosed in 02/2017 with spontaneous conversion back to NSR.   Marland Kitchen Chronic back pain   . Essential hypertension   . H. pylori infection    Treated with Prevpac  . Hemorrhoids   . PSVT (paroxysmal supraventricular tachycardia) ([place])    a. s/p ablation in 12/2016  . Tubular adenoma     Past Surgical History:  Procedure Laterality Date  . COLONOSCOPY  08/07/10   friable anal canal otherwise normal  . COLONOSCOPY [name] 03/18/2017   Procedure: COLONOSCOPY;  Surgeon: Daneil Dolin, MD;  [facility];  Service: Endoscopy;  Laterality: [name];  1:15pm  . COLONOSCOPY WITH ESOPHAGOGASTRODUODENOSCOPY (EGD) [name] 07/06/2013   Dr. Gala Romney- inflamed hemorrhoids- multiple colonic polyps= tubular adenoma. stomach bx= hpylori treated with prevpac  . ESOPHAGOGASTRODUODENOSCOPY  08/07/10   schatzkis ring otherwise normal, s/p 56-F dilation, small hiatal hernia.chronic active gastritis on bx  . HEMORRHOID BANDING    . HERNIA REPAIR    . SVT ABLATION [name] 12/15/2016   Procedure: SVT Ablation;  Surgeon: [name], MD;  Location: [facility];  Service: Cardiovascular;  Laterality: [name];  . TOTAL HIP ARTHROPLASTY Left 10/04/2015   Procedure: LEFT TOTAL HIP ARTHROPLASTY ANTERIOR APPROACH;  Surgeon: Mcarthur Rossetti, MD;  Location: [facility];  Service: Orthopedics;  Laterality: Left;  . UMBILICAL HERNIA REPAIR  08/05/2011   Procedure: HERNIA REPAIR UMBILICAL ADULT;  Surgeon: Jamesetta So, MD;  Location: [facility];  Service: General;  Laterality: [name];    Current Outpatient Medications  Medication Sig  Dispense Refill  . aspirin EC 81 MG tablet Take 81 mg by mouth daily.    Marland Kitchen ibuprofen (ADVIL,MOTRIN) 200 MG tablet Take 400 mg by mouth daily as needed for headache or moderate pain.    Marland Kitchen losartan (COZAAR) 100 MG tablet Take 100 mg by mouth daily.    . metoprolol succinate (TOPROL-XL) 50 MG 24 hr tablet Take 1 tablet (50 mg total) by mouth daily. 90 tablet 3  . oxyCODONE-acetaminophen (PERCOCET) 10-325 MG tablet Take 1 tablet by mouth every 4 (four) hours as needed for pain.     No current facility-administered medications for this visit.    Allergies:  Patient has no known allergies.   Social History: The patient  reports that he has been smoking cigars. He has a 5.00 pack-year smoking history. He has never used smokeless tobacco. He reports that he drinks alcohol. He reports that he does not use drugs.   Family History: The patient's family history includes Diabetes in his mother; Hypertension in his mother.   ROS:  Please see the history of present illness. Otherwise, complete review of systems is positive for {NONE DEFAULTED:18576::"none"}.  All other systems are reviewed and negative.   Physical Exam: VS:  There were no vitals taken for this visit., BMI There is no height or weight on file to calculate BMI.  Wt Readings from Last 3 Encounters:  09/17/17 204 lb (92.5 kg)  03/18/17 207 lb (93.9 kg)  03/01/17 207 lb (93.9 kg)  General: Patient appears comfortable at rest. HEENT: Conjunctiva and lids normal, oropharynx clear with moist mucosa. Neck: Supple, no elevated JVP or carotid bruits, no thyromegaly. Lungs: Clear to auscultation, nonlabored breathing at rest. Cardiac: Regular rate and rhythm, no S3 or significant systolic murmur, no pericardial rub. Abdomen: Soft, nontender, no hepatomegaly, bowel sounds present, no guarding or rebound. Extremities: No pitting edema, distal pulses 2+. Skin: Warm and dry. Musculoskeletal: No kyphosis. Neuropsychiatric: Alert and oriented x3,  affect grossly appropriate.  ECG: I personally reviewed the tracing from 03/01/2017 which showed sinus rhythm with decreased R wave progression and nonspecific ST-T changes.  Recent Labwork: 02/15/2017: Hemoglobin 15.6; Magnesium 1.7; Platelets 280; TSH 0.509 03/01/2017: BUN 12; Creatinine, Ser 1.43; Potassium 3.9; Sodium 136  May 2019: Hemoglobin 16.2, platelets 238, BUN 12, creatinine 1.18, potassium 3.8, AST 48, ALT 45, troponin T less than 0.01  Other Studies Reviewed Today:  Echocardiogram 04/24/2016: Study Conclusions  - Left ventricle: The cavity size was normal. Wall thickness was increased in a pattern of moderate LVH. Systolic function was normal. The estimated ejection fraction was in the range of 55% to 60%. Wall motion was normal; there were no regional wall motion abnormalities. Diastolic dysfunction, grade indeterminate. - Left atrium: The atrium was mildly to moderately dilated.  Assessment and Plan:    Current medicines were reviewed with the patient today.  No orders of the defined types were placed in this encounter.   Disposition:  Signed, Satira Sark, MD, [facility] 01/31/2018 9:11 AM    [facility], [place], [place] [address] Phone: [phone]; Fax: [phone]

## 2018-02-01 ENCOUNTER — Ambulatory Visit: Payer: Self-pay | Admitting: Cardiology

## 2018-02-02 ENCOUNTER — Encounter: Payer: Self-pay | Admitting: Cardiology

## 2018-03-01 IMAGING — DX DG CHEST 2V
2 series · 2 of 2 positions shown · non-contrast
Comparison: 02/15/2017

CLINICAL DATA: Productive cough at night

EXAM:
CHEST  2 VIEW

[chest pa]
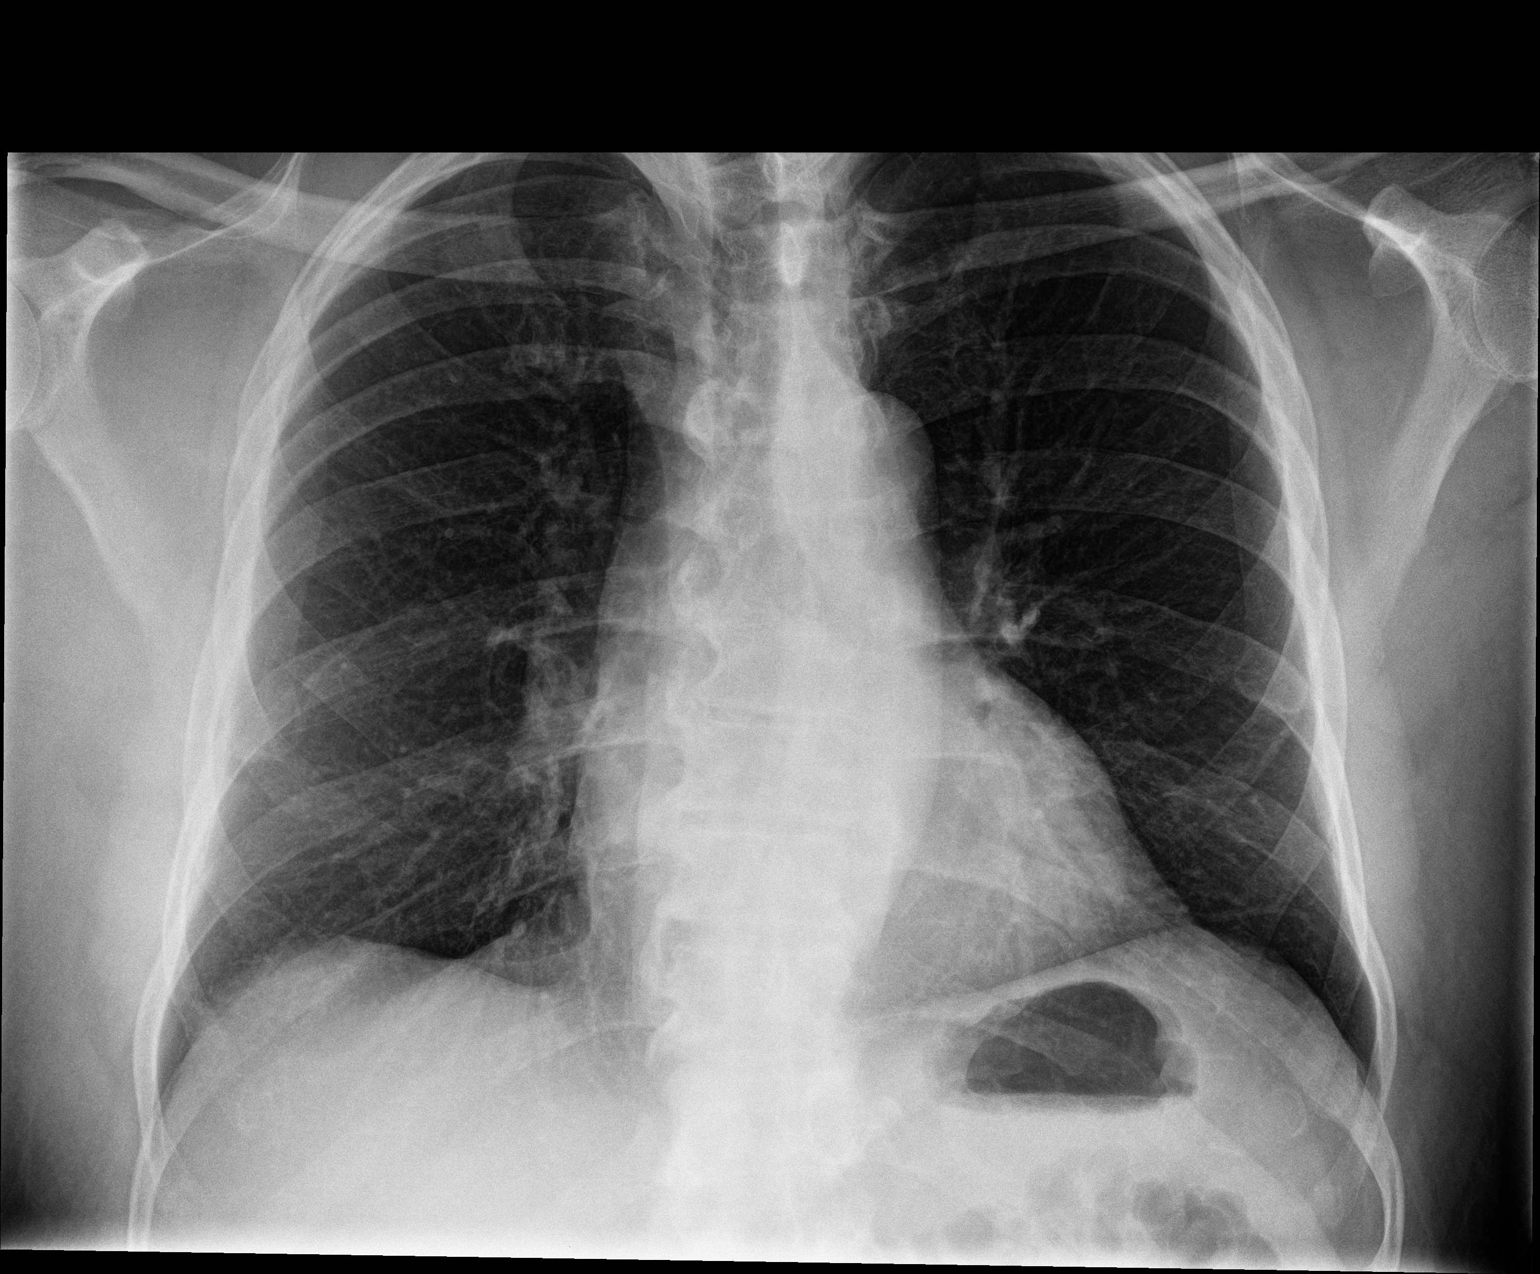

[chest lat]
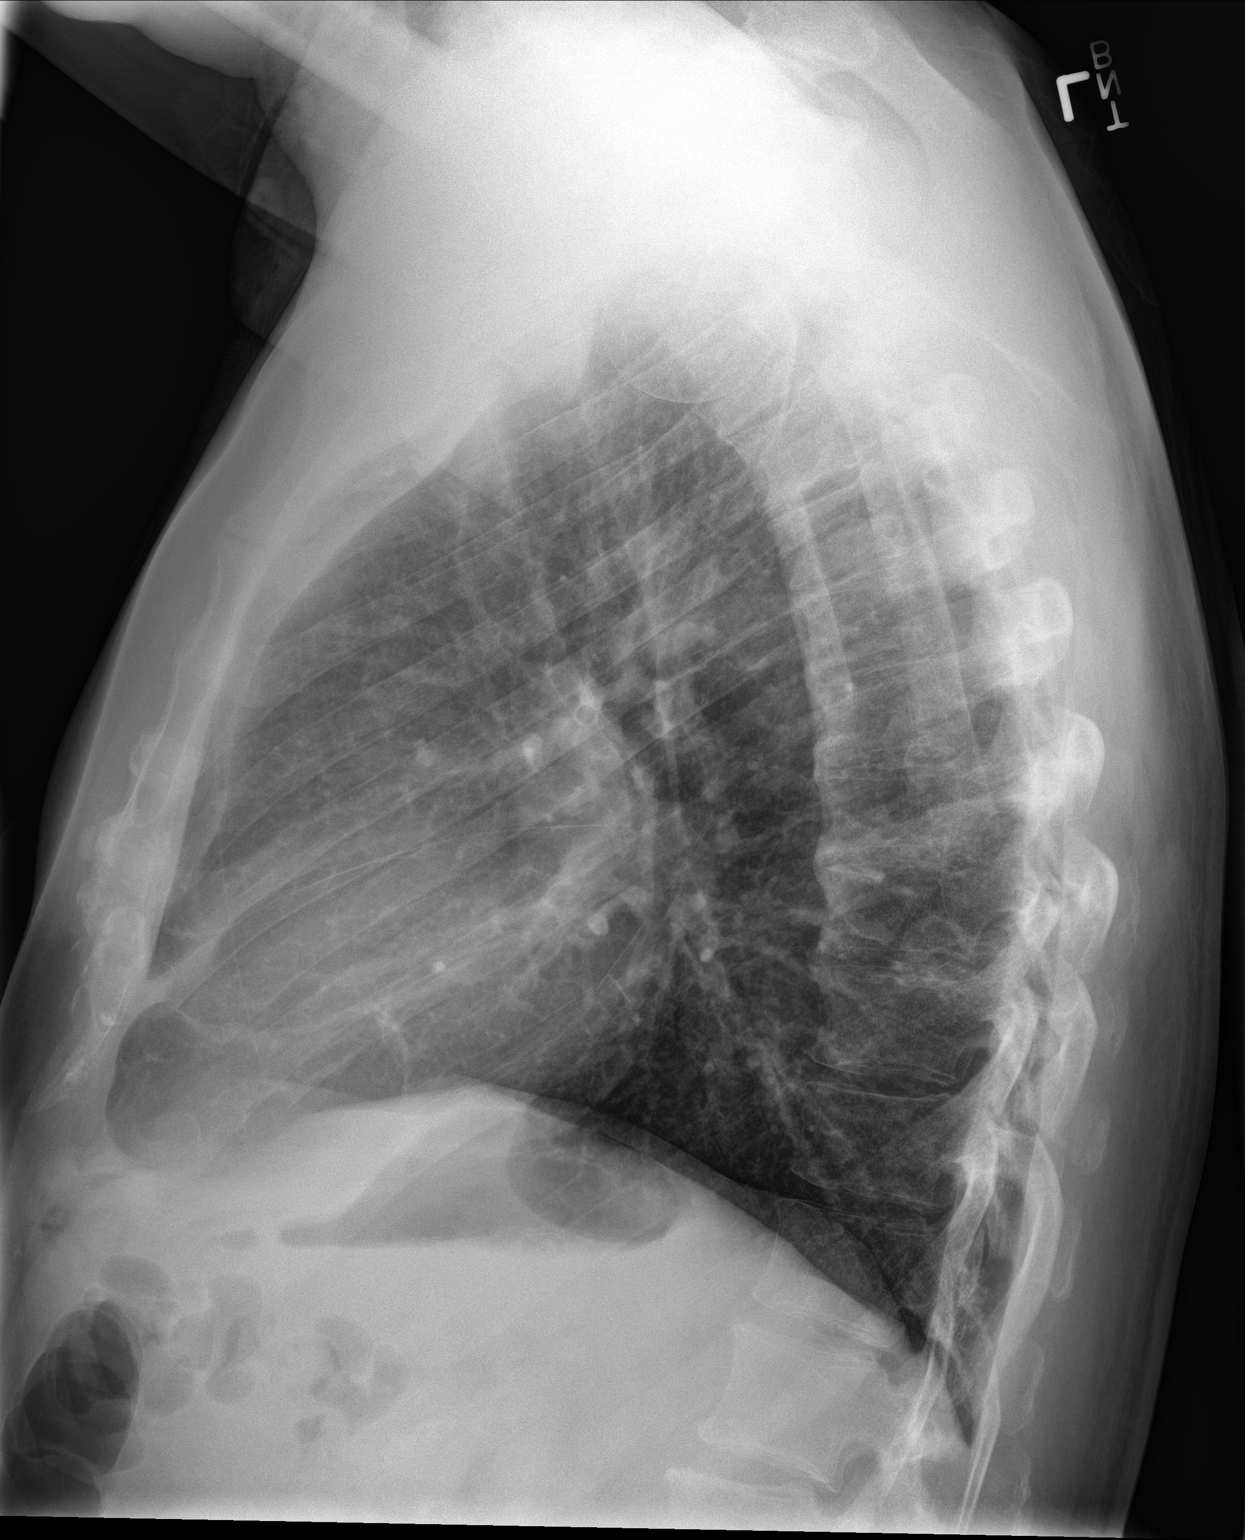

[2 of 2 positions shown; findings below may reference images not displayed]

FINDINGS: Heart and mediastinal contours are within normal limits. No focal
opacities or effusions. No acute bony abnormality.
IMPRESSION: No active cardiopulmonary disease.

## 2018-06-10 DIAGNOSIS — Z1389 Encounter for screening for other disorder: Secondary | ICD-10-CM | POA: Diagnosis not present

## 2018-06-10 DIAGNOSIS — Z6830 Body mass index (BMI) 30.0-30.9, adult: Secondary | ICD-10-CM | POA: Diagnosis not present

## 2018-06-10 DIAGNOSIS — M25561 Pain in right knee: Secondary | ICD-10-CM | POA: Diagnosis not present

## 2018-06-10 DIAGNOSIS — M545 Low back pain: Secondary | ICD-10-CM | POA: Diagnosis not present

## 2018-06-10 DIAGNOSIS — E6609 Other obesity due to excess calories: Secondary | ICD-10-CM | POA: Diagnosis not present

## 2018-07-08 DIAGNOSIS — Z6831 Body mass index (BMI) 31.0-31.9, adult: Secondary | ICD-10-CM | POA: Diagnosis not present

## 2018-07-08 DIAGNOSIS — F1120 Opioid dependence, uncomplicated: Secondary | ICD-10-CM | POA: Diagnosis not present

## 2018-07-08 DIAGNOSIS — E6609 Other obesity due to excess calories: Secondary | ICD-10-CM | POA: Diagnosis not present

## 2018-07-08 DIAGNOSIS — M10032 Idiopathic gout, left wrist: Secondary | ICD-10-CM | POA: Diagnosis not present

## 2018-08-05 DIAGNOSIS — F17200 Nicotine dependence, unspecified, uncomplicated: Secondary | ICD-10-CM | POA: Diagnosis not present

## 2018-08-05 DIAGNOSIS — Z6829 Body mass index (BMI) 29.0-29.9, adult: Secondary | ICD-10-CM | POA: Diagnosis not present

## 2018-08-05 DIAGNOSIS — G894 Chronic pain syndrome: Secondary | ICD-10-CM | POA: Diagnosis not present

## 2018-08-05 DIAGNOSIS — Z719 Counseling, unspecified: Secondary | ICD-10-CM | POA: Diagnosis not present

## 2018-09-02 DIAGNOSIS — Z0001 Encounter for general adult medical examination with abnormal findings: Secondary | ICD-10-CM | POA: Diagnosis not present

## 2018-09-02 DIAGNOSIS — Z6829 Body mass index (BMI) 29.0-29.9, adult: Secondary | ICD-10-CM | POA: Diagnosis not present

## 2018-09-02 DIAGNOSIS — G894 Chronic pain syndrome: Secondary | ICD-10-CM | POA: Diagnosis not present

## 2018-09-02 DIAGNOSIS — E7849 Other hyperlipidemia: Secondary | ICD-10-CM | POA: Diagnosis not present

## 2018-09-02 DIAGNOSIS — F17290 Nicotine dependence, other tobacco product, uncomplicated: Secondary | ICD-10-CM | POA: Diagnosis not present

## 2018-09-02 DIAGNOSIS — Z719 Counseling, unspecified: Secondary | ICD-10-CM | POA: Diagnosis not present

## 2018-09-02 DIAGNOSIS — I10 Essential (primary) hypertension: Secondary | ICD-10-CM | POA: Diagnosis not present

## 2018-09-21 ENCOUNTER — Other Ambulatory Visit: Payer: Self-pay | Admitting: Cardiology

## 2018-09-30 DIAGNOSIS — G894 Chronic pain syndrome: Secondary | ICD-10-CM | POA: Diagnosis not present

## 2018-09-30 DIAGNOSIS — Z6830 Body mass index (BMI) 30.0-30.9, adult: Secondary | ICD-10-CM | POA: Diagnosis not present

## 2018-09-30 DIAGNOSIS — E6609 Other obesity due to excess calories: Secondary | ICD-10-CM | POA: Diagnosis not present

## 2018-09-30 DIAGNOSIS — I10 Essential (primary) hypertension: Secondary | ICD-10-CM | POA: Diagnosis not present

## 2018-09-30 DIAGNOSIS — F1120 Opioid dependence, uncomplicated: Secondary | ICD-10-CM | POA: Diagnosis not present

## 2018-10-28 DIAGNOSIS — M5136 Other intervertebral disc degeneration, lumbar region: Secondary | ICD-10-CM | POA: Diagnosis not present

## 2018-10-28 DIAGNOSIS — F1010 Alcohol abuse, uncomplicated: Secondary | ICD-10-CM | POA: Diagnosis not present

## 2018-10-28 DIAGNOSIS — G894 Chronic pain syndrome: Secondary | ICD-10-CM | POA: Diagnosis not present

## 2018-10-28 DIAGNOSIS — I471 Supraventricular tachycardia: Secondary | ICD-10-CM | POA: Diagnosis not present

## 2018-10-28 DIAGNOSIS — E6609 Other obesity due to excess calories: Secondary | ICD-10-CM | POA: Diagnosis not present

## 2018-10-28 DIAGNOSIS — I10 Essential (primary) hypertension: Secondary | ICD-10-CM | POA: Diagnosis not present

## 2018-10-28 DIAGNOSIS — E7849 Other hyperlipidemia: Secondary | ICD-10-CM | POA: Diagnosis not present

## 2018-10-28 DIAGNOSIS — Z6830 Body mass index (BMI) 30.0-30.9, adult: Secondary | ICD-10-CM | POA: Diagnosis not present

## 2018-11-25 DIAGNOSIS — G894 Chronic pain syndrome: Secondary | ICD-10-CM | POA: Diagnosis not present

## 2018-11-25 DIAGNOSIS — M5136 Other intervertebral disc degeneration, lumbar region: Secondary | ICD-10-CM | POA: Diagnosis not present

## 2018-11-25 DIAGNOSIS — M25561 Pain in right knee: Secondary | ICD-10-CM | POA: Diagnosis not present

## 2018-11-25 DIAGNOSIS — Z6829 Body mass index (BMI) 29.0-29.9, adult: Secondary | ICD-10-CM | POA: Diagnosis not present

## 2018-12-29 DIAGNOSIS — E7849 Other hyperlipidemia: Secondary | ICD-10-CM | POA: Diagnosis not present

## 2018-12-29 DIAGNOSIS — G894 Chronic pain syndrome: Secondary | ICD-10-CM | POA: Diagnosis not present

## 2018-12-29 DIAGNOSIS — Z6829 Body mass index (BMI) 29.0-29.9, adult: Secondary | ICD-10-CM | POA: Diagnosis not present

## 2018-12-29 DIAGNOSIS — I10 Essential (primary) hypertension: Secondary | ICD-10-CM | POA: Diagnosis not present

## 2018-12-29 DIAGNOSIS — M1611 Unilateral primary osteoarthritis, right hip: Secondary | ICD-10-CM | POA: Diagnosis not present

## 2019-01-03 DIAGNOSIS — M5416 Radiculopathy, lumbar region: Secondary | ICD-10-CM | POA: Diagnosis not present

## 2019-01-03 DIAGNOSIS — S83241A Other tear of medial meniscus, current injury, right knee, initial encounter: Secondary | ICD-10-CM | POA: Diagnosis not present

## 2019-01-03 DIAGNOSIS — M25561 Pain in right knee: Secondary | ICD-10-CM | POA: Diagnosis not present

## 2019-01-27 DIAGNOSIS — E663 Overweight: Secondary | ICD-10-CM | POA: Diagnosis not present

## 2019-01-27 DIAGNOSIS — M5136 Other intervertebral disc degeneration, lumbar region: Secondary | ICD-10-CM | POA: Diagnosis not present

## 2019-01-27 DIAGNOSIS — G894 Chronic pain syndrome: Secondary | ICD-10-CM | POA: Diagnosis not present

## 2019-01-27 DIAGNOSIS — Z6829 Body mass index (BMI) 29.0-29.9, adult: Secondary | ICD-10-CM | POA: Diagnosis not present

## 2019-02-10 ENCOUNTER — Other Ambulatory Visit: Payer: Self-pay

## 2019-02-10 DIAGNOSIS — Z20822 Contact with and (suspected) exposure to covid-19: Secondary | ICD-10-CM

## 2019-02-12 LAB — NOVEL CORONAVIRUS, NAA: SARS-CoV-2, NAA: NOT DETECTED

## 2019-02-24 DIAGNOSIS — I10 Essential (primary) hypertension: Secondary | ICD-10-CM | POA: Diagnosis not present

## 2019-02-24 DIAGNOSIS — G894 Chronic pain syndrome: Secondary | ICD-10-CM | POA: Diagnosis not present

## 2019-02-24 DIAGNOSIS — M5136 Other intervertebral disc degeneration, lumbar region: Secondary | ICD-10-CM | POA: Diagnosis not present

## 2019-02-24 DIAGNOSIS — Z6828 Body mass index (BMI) 28.0-28.9, adult: Secondary | ICD-10-CM | POA: Diagnosis not present

## 2019-02-24 DIAGNOSIS — E7849 Other hyperlipidemia: Secondary | ICD-10-CM | POA: Diagnosis not present

## 2019-03-24 DIAGNOSIS — Z6829 Body mass index (BMI) 29.0-29.9, adult: Secondary | ICD-10-CM | POA: Diagnosis not present

## 2019-03-24 DIAGNOSIS — M545 Low back pain: Secondary | ICD-10-CM | POA: Diagnosis not present

## 2019-03-24 DIAGNOSIS — M1711 Unilateral primary osteoarthritis, right knee: Secondary | ICD-10-CM | POA: Diagnosis not present

## 2019-03-24 DIAGNOSIS — M5136 Other intervertebral disc degeneration, lumbar region: Secondary | ICD-10-CM | POA: Diagnosis not present

## 2019-03-24 DIAGNOSIS — E663 Overweight: Secondary | ICD-10-CM | POA: Diagnosis not present

## 2019-04-13 ENCOUNTER — Other Ambulatory Visit: Payer: Self-pay

## 2019-04-13 ENCOUNTER — Ambulatory Visit: Payer: Medicare PPO | Admitting: Orthopaedic Surgery

## 2019-04-13 ENCOUNTER — Ambulatory Visit: Payer: Medicare PPO

## 2019-04-13 ENCOUNTER — Encounter: Payer: Self-pay | Admitting: Orthopaedic Surgery

## 2019-04-13 VITALS — BP 144/87 | HR 74 | Temp 97.6°F | Ht 66.0 in | Wt 189.0 lb

## 2019-04-13 DIAGNOSIS — M25561 Pain in right knee: Secondary | ICD-10-CM

## 2019-04-13 DIAGNOSIS — G8929 Other chronic pain: Secondary | ICD-10-CM | POA: Diagnosis not present

## 2019-04-13 MED ORDER — NAPROXEN 500 MG PO TABS: 500.0000 mg | ORAL_TABLET | Freq: Two times a day (BID) | ORAL | 5 refills | Status: DC

## 2019-04-13 NOTE — Progress Notes (Signed)
Subjective:    Patient ID: Calvin Henry, male    DOB: Nov 14, 1960, 58 y.o.   MRN: [id number]  HPI He has had pain of the right knee for many months, more than six.  He has swelling at times, popping and has given way once or twice.  He has no trauma, no redness, no weakness.  He has tried Tylenol and heat. He has been seen at [facility]. I have reviewed his notes.  He has no recent x-rays.    Review of Systems  Constitutional: Positive for activity change.  Musculoskeletal: Positive for arthralgias, gait problem and joint swelling.  All other systems reviewed and are negative.  For Review of Systems, all other systems reviewed and are negative.  The following is a summary of the past history medically, past history surgically, known current medicines, social history and family history.  This information is gathered electronically by the computer from prior information and documentation.  I review this each visit and have found including this information at this point in the chart is beneficial and informative.   Past Medical History:  Diagnosis Date  . Acid reflux   . Arthritis   . Atrial flutter ([place])    a. diagnosed in 02/2017 with spontaneous conversion back to NSR.   Marland Kitchen Chronic back pain   . Essential hypertension   . H. pylori infection    Treated with Prevpac  . Hemorrhoids   . PSVT (paroxysmal supraventricular tachycardia) ([place])    a. s/p ablation in 12/2016  . Tubular adenoma     Past Surgical History:  Procedure Laterality Date  . COLONOSCOPY  08/07/10   friable anal canal otherwise normal  . COLONOSCOPY [name] 03/18/2017   Procedure: COLONOSCOPY;  Surgeon: Daneil Dolin, MD;  [facility];  Service: Endoscopy;  Laterality: [name];  1:15pm  . COLONOSCOPY WITH ESOPHAGOGASTRODUODENOSCOPY (EGD) [name] 07/06/2013   Dr. Gala Romney- inflamed hemorrhoids- multiple colonic polyps= tubular adenoma. stomach bx= hpylori treated with prevpac  . ESOPHAGOGASTRODUODENOSCOPY   08/07/10   schatzkis ring otherwise normal, s/p 56-F dilation, small hiatal hernia.chronic active gastritis on bx  . HEMORRHOID BANDING    . HERNIA REPAIR    . SVT ABLATION [name] 12/15/2016   Procedure: SVT Ablation;  Surgeon: [name], MD;  Location: [facility];  Service: Cardiovascular;  Laterality: [name];  . TOTAL HIP ARTHROPLASTY Left 10/04/2015   Procedure: LEFT TOTAL HIP ARTHROPLASTY ANTERIOR APPROACH;  Surgeon: Mcarthur Rossetti, MD;  [facility];  Service: Orthopedics;  Laterality: Left;  . UMBILICAL HERNIA REPAIR  08/05/2011   Procedure: HERNIA REPAIR UMBILICAL ADULT;  Surgeon: Jamesetta So, MD;  Location: [facility];  Service: General;  Laterality: [name];    Current Outpatient Medications on File Prior to Visit  Medication Sig Dispense Refill  . aspirin EC 81 MG tablet Take 81 mg by mouth daily.    Marland Kitchen ibuprofen (ADVIL,MOTRIN) 200 MG tablet Take 400 mg by mouth daily as needed for headache or moderate pain.    Marland Kitchen losartan (COZAAR) 100 MG tablet Take 100 mg by mouth daily.    . metoprolol succinate (TOPROL-XL) 50 MG 24 hr tablet TAKE 1 TABLET BY MOUTH DAILY 90 tablet 3  . oxyCODONE-acetaminophen (PERCOCET) 10-325 MG tablet Take 1 tablet by mouth every 4 (four) hours as needed for pain.     No current facility-administered medications on file prior to visit.    Social History   Socioeconomic History  . Marital status:  Married    Spouse name: Not on file  . Number of children: Not on file  . Years of education: Not on file  . Highest education level: Not on file  Occupational History  . Not on file  Tobacco Use  . Smoking status: Current Some Day Smoker    Packs/day: 0.50    Years: 10.00    Pack years: 5.00    Types: Cigars    Last attempt to quit: 12/01/1986    Years since quitting: 32.3  . Smokeless tobacco: Never Used  . Tobacco comment: Quit x 20 plus years  Substance and Sexual Activity  . Alcohol use: Yes    Comment: a beer 1-2 times a week  . Drug  use: No    Comment: remote marijuana use   . Sexual activity: Yes  Other Topics Concern  . 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 Difficulty of Paying Living Expenses: Not on file  Food Insecurity:   . Worried About Charity fundraiser in the Last Year: Not on file  . Ran Out of Food in the Last Year: Not on file  Transportation Needs:   . Lack of Transportation (Medical): Not on file  . Lack of Transportation (Non-Medical): Not on file  Physical Activity:   . Days of Exercise per Week: Not on file  . Minutes of Exercise per Session: Not on file  Stress:   . Feeling of Stress : Not on file  Social Connections:   . Frequency of Communication with Friends and Family: Not on file  . Frequency of Social Gatherings with Friends and Family: Not on file  . Attends Religious Services: Not on file  . Active Member of Clubs or Organizations: Not on file  . Attends Archivist Meetings: Not on file  . Marital Status: Not on file  Intimate Partner Violence:   . Fear of Current or Ex-Partner: Not on file  . Emotionally Abused: Not on file  . Physically Abused: Not on file  . Sexually Abused: Not on file    Family History  Problem Relation Age of Onset  . Diabetes Mother   . Hypertension Mother   . Colon cancer Neg Hx     BP (!) 144/87   Pulse 74   Temp 97.6 F (36.4 C)   Ht 5\' 6"  (1.676 m)   Wt 189 lb (85.7 kg)   BMI 30.51 kg/m   Body mass index is 30.51 kg/m.     Objective:   Physical Exam Vitals and nursing note reviewed.  Constitutional:      Appearance: He is well-developed.  HENT:     Head: Normocephalic and atraumatic.  Eyes:     Conjunctiva/sclera: Conjunctivae normal.     Pupils: Pupils are equal, round, and reactive to light.  Cardiovascular:     Rate and Rhythm: Normal rate and regular rhythm.  Pulmonary:     Effort: Pulmonary  effort is normal.  Abdominal:     Palpations: Abdomen is soft.  Musculoskeletal:     Cervical back: Normal range of motion and neck supple.       Legs:  Skin:    General: Skin is warm and dry.  Neurological:     Mental Status: He is alert and oriented to person, place, and time.  Cranial Nerves: No cranial nerve deficit.     Motor: No abnormal muscle tone.     Coordination: Coordination normal.     Deep Tendon Reflexes: Reflexes are normal and symmetric. Reflexes normal.  Psychiatric:        Behavior: Behavior normal.        Thought Content: Thought content normal.        Judgment: Judgment normal.    X-rays were done of the right knee, reported separately.       Assessment & Plan:   Encounter Diagnosis  Name Primary?  . Chronic pain of right knee Yes   PROCEDURE NOTE:  The patient requests injections of the right knee , verbal consent was obtained.  The right knee was prepped appropriately after time out was performed.   Sterile technique was observed and injection of 1 cc of Depo-Medrol 40 mg with several cc's of plain xylocaine. Anesthesia was provided by ethyl chloride and a 20-gauge needle was used to inject the knee area. The injection was tolerated well.  A band aid dressing was applied.  The patient was advised to apply ice later today and tomorrow to the injection sight as needed.  I will begin Naprosyn 500 po bid pc.  I will see him in the [place] office on January 20th.  Call if any problem.  Precautions discussed.   Electronically Signed Sanjuana Kava, MD 12/31/20209:59 AM

## 2019-04-28 DIAGNOSIS — G894 Chronic pain syndrome: Secondary | ICD-10-CM | POA: Diagnosis not present

## 2019-04-28 DIAGNOSIS — E663 Overweight: Secondary | ICD-10-CM | POA: Diagnosis not present

## 2019-04-28 DIAGNOSIS — Z1389 Encounter for screening for other disorder: Secondary | ICD-10-CM | POA: Diagnosis not present

## 2019-04-28 DIAGNOSIS — Z6829 Body mass index (BMI) 29.0-29.9, adult: Secondary | ICD-10-CM | POA: Diagnosis not present

## 2019-04-30 DIAGNOSIS — R079 Chest pain, unspecified: Secondary | ICD-10-CM | POA: Diagnosis not present

## 2019-04-30 DIAGNOSIS — I4891 Unspecified atrial fibrillation: Secondary | ICD-10-CM | POA: Diagnosis not present

## 2019-04-30 DIAGNOSIS — Z8249 Family history of ischemic heart disease and other diseases of the circulatory system: Secondary | ICD-10-CM | POA: Diagnosis not present

## 2019-04-30 DIAGNOSIS — Z87891 Personal history of nicotine dependence: Secondary | ICD-10-CM | POA: Diagnosis not present

## 2019-04-30 DIAGNOSIS — I10 Essential (primary) hypertension: Secondary | ICD-10-CM | POA: Diagnosis not present

## 2019-04-30 DIAGNOSIS — E785 Hyperlipidemia, unspecified: Secondary | ICD-10-CM | POA: Diagnosis not present

## 2019-04-30 DIAGNOSIS — I480 Paroxysmal atrial fibrillation: Secondary | ICD-10-CM | POA: Diagnosis not present

## 2019-04-30 DIAGNOSIS — Z20822 Contact with and (suspected) exposure to covid-19: Secondary | ICD-10-CM | POA: Diagnosis not present

## 2019-04-30 DIAGNOSIS — F1010 Alcohol abuse, uncomplicated: Secondary | ICD-10-CM | POA: Diagnosis not present

## 2019-04-30 DIAGNOSIS — Z96642 Presence of left artificial hip joint: Secondary | ICD-10-CM | POA: Diagnosis not present

## 2019-04-30 DIAGNOSIS — I209 Angina pectoris, unspecified: Secondary | ICD-10-CM | POA: Diagnosis not present

## 2019-04-30 DIAGNOSIS — R0789 Other chest pain: Secondary | ICD-10-CM | POA: Diagnosis not present

## 2019-05-01 DIAGNOSIS — Z96642 Presence of left artificial hip joint: Secondary | ICD-10-CM | POA: Diagnosis not present

## 2019-05-01 DIAGNOSIS — I480 Paroxysmal atrial fibrillation: Secondary | ICD-10-CM | POA: Diagnosis not present

## 2019-05-01 DIAGNOSIS — Z87891 Personal history of nicotine dependence: Secondary | ICD-10-CM | POA: Diagnosis not present

## 2019-05-01 DIAGNOSIS — E785 Hyperlipidemia, unspecified: Secondary | ICD-10-CM | POA: Diagnosis not present

## 2019-05-01 DIAGNOSIS — I209 Angina pectoris, unspecified: Secondary | ICD-10-CM | POA: Diagnosis not present

## 2019-05-01 DIAGNOSIS — F1010 Alcohol abuse, uncomplicated: Secondary | ICD-10-CM | POA: Diagnosis not present

## 2019-05-01 DIAGNOSIS — Z20822 Contact with and (suspected) exposure to covid-19: Secondary | ICD-10-CM | POA: Diagnosis not present

## 2019-05-01 DIAGNOSIS — I10 Essential (primary) hypertension: Secondary | ICD-10-CM | POA: Diagnosis not present

## 2019-05-01 DIAGNOSIS — Z8249 Family history of ischemic heart disease and other diseases of the circulatory system: Secondary | ICD-10-CM | POA: Diagnosis not present

## 2019-05-01 DIAGNOSIS — I4891 Unspecified atrial fibrillation: Secondary | ICD-10-CM | POA: Diagnosis not present

## 2019-05-02 DIAGNOSIS — Z96642 Presence of left artificial hip joint: Secondary | ICD-10-CM | POA: Diagnosis not present

## 2019-05-02 DIAGNOSIS — I480 Paroxysmal atrial fibrillation: Secondary | ICD-10-CM | POA: Diagnosis not present

## 2019-05-02 DIAGNOSIS — Z20822 Contact with and (suspected) exposure to covid-19: Secondary | ICD-10-CM | POA: Diagnosis not present

## 2019-05-02 DIAGNOSIS — R0789 Other chest pain: Secondary | ICD-10-CM | POA: Diagnosis not present

## 2019-05-02 DIAGNOSIS — F1010 Alcohol abuse, uncomplicated: Secondary | ICD-10-CM | POA: Diagnosis not present

## 2019-05-02 DIAGNOSIS — Z87891 Personal history of nicotine dependence: Secondary | ICD-10-CM | POA: Diagnosis not present

## 2019-05-02 DIAGNOSIS — Z8249 Family history of ischemic heart disease and other diseases of the circulatory system: Secondary | ICD-10-CM | POA: Diagnosis not present

## 2019-05-02 DIAGNOSIS — I10 Essential (primary) hypertension: Secondary | ICD-10-CM | POA: Diagnosis not present

## 2019-05-02 DIAGNOSIS — I209 Angina pectoris, unspecified: Secondary | ICD-10-CM | POA: Diagnosis not present

## 2019-05-02 DIAGNOSIS — R079 Chest pain, unspecified: Secondary | ICD-10-CM | POA: Diagnosis not present

## 2019-05-02 DIAGNOSIS — E785 Hyperlipidemia, unspecified: Secondary | ICD-10-CM | POA: Diagnosis not present

## 2019-05-03 ENCOUNTER — Ambulatory Visit: Payer: Medicare PPO | Admitting: Orthopaedic Surgery

## 2019-05-22 DIAGNOSIS — E785 Hyperlipidemia, unspecified: Secondary | ICD-10-CM | POA: Insufficient documentation

## 2019-05-22 DIAGNOSIS — I10 Essential (primary) hypertension: Secondary | ICD-10-CM | POA: Diagnosis not present

## 2019-05-22 DIAGNOSIS — R9439 Abnormal result of other cardiovascular function study: Secondary | ICD-10-CM | POA: Insufficient documentation

## 2019-05-22 DIAGNOSIS — I480 Paroxysmal atrial fibrillation: Secondary | ICD-10-CM | POA: Diagnosis not present

## 2019-05-22 DIAGNOSIS — F419 Anxiety disorder, unspecified: Secondary | ICD-10-CM | POA: Insufficient documentation

## 2019-05-22 DIAGNOSIS — I209 Angina pectoris, unspecified: Secondary | ICD-10-CM | POA: Diagnosis not present

## 2019-05-26 DIAGNOSIS — I10 Essential (primary) hypertension: Secondary | ICD-10-CM | POA: Diagnosis not present

## 2019-05-26 DIAGNOSIS — E7849 Other hyperlipidemia: Secondary | ICD-10-CM | POA: Diagnosis not present

## 2019-05-26 DIAGNOSIS — Z0001 Encounter for general adult medical examination with abnormal findings: Secondary | ICD-10-CM | POA: Diagnosis not present

## 2019-05-26 DIAGNOSIS — G894 Chronic pain syndrome: Secondary | ICD-10-CM | POA: Diagnosis not present

## 2019-05-26 DIAGNOSIS — Z1389 Encounter for screening for other disorder: Secondary | ICD-10-CM | POA: Diagnosis not present

## 2019-05-26 DIAGNOSIS — I471 Supraventricular tachycardia: Secondary | ICD-10-CM | POA: Diagnosis not present

## 2019-05-26 DIAGNOSIS — Z6829 Body mass index (BMI) 29.0-29.9, adult: Secondary | ICD-10-CM | POA: Diagnosis not present

## 2019-06-07 DIAGNOSIS — Z7982 Long term (current) use of aspirin: Secondary | ICD-10-CM | POA: Diagnosis not present

## 2019-06-07 DIAGNOSIS — R9439 Abnormal result of other cardiovascular function study: Secondary | ICD-10-CM | POA: Diagnosis not present

## 2019-06-07 DIAGNOSIS — I480 Paroxysmal atrial fibrillation: Secondary | ICD-10-CM | POA: Diagnosis not present

## 2019-06-07 DIAGNOSIS — E785 Hyperlipidemia, unspecified: Secondary | ICD-10-CM | POA: Diagnosis not present

## 2019-06-07 DIAGNOSIS — F17210 Nicotine dependence, cigarettes, uncomplicated: Secondary | ICD-10-CM | POA: Diagnosis not present

## 2019-06-07 DIAGNOSIS — Z9114 Patient's other noncompliance with medication regimen: Secondary | ICD-10-CM | POA: Diagnosis not present

## 2019-06-07 DIAGNOSIS — I10 Essential (primary) hypertension: Secondary | ICD-10-CM | POA: Diagnosis not present

## 2019-06-07 DIAGNOSIS — I25119 Atherosclerotic heart disease of native coronary artery with unspecified angina pectoris: Secondary | ICD-10-CM | POA: Diagnosis not present

## 2019-06-07 DIAGNOSIS — R0789 Other chest pain: Secondary | ICD-10-CM | POA: Diagnosis not present

## 2019-06-07 DIAGNOSIS — I351 Nonrheumatic aortic (valve) insufficiency: Secondary | ICD-10-CM | POA: Diagnosis not present

## 2019-06-07 DIAGNOSIS — I209 Angina pectoris, unspecified: Secondary | ICD-10-CM | POA: Diagnosis not present

## 2019-06-07 DIAGNOSIS — F419 Anxiety disorder, unspecified: Secondary | ICD-10-CM | POA: Diagnosis not present

## 2019-06-23 DIAGNOSIS — E7849 Other hyperlipidemia: Secondary | ICD-10-CM | POA: Diagnosis not present

## 2019-06-23 DIAGNOSIS — E663 Overweight: Secondary | ICD-10-CM | POA: Diagnosis not present

## 2019-06-23 DIAGNOSIS — I471 Supraventricular tachycardia: Secondary | ICD-10-CM | POA: Diagnosis not present

## 2019-06-23 DIAGNOSIS — G894 Chronic pain syndrome: Secondary | ICD-10-CM | POA: Diagnosis not present

## 2019-06-23 DIAGNOSIS — Z6829 Body mass index (BMI) 29.0-29.9, adult: Secondary | ICD-10-CM | POA: Diagnosis not present

## 2019-07-09 DIAGNOSIS — M170 Bilateral primary osteoarthritis of knee: Secondary | ICD-10-CM | POA: Diagnosis not present

## 2019-07-09 DIAGNOSIS — E785 Hyperlipidemia, unspecified: Secondary | ICD-10-CM | POA: Diagnosis not present

## 2019-07-09 DIAGNOSIS — Z6830 Body mass index (BMI) 30.0-30.9, adult: Secondary | ICD-10-CM | POA: Diagnosis not present

## 2019-07-09 DIAGNOSIS — I4891 Unspecified atrial fibrillation: Secondary | ICD-10-CM | POA: Diagnosis not present

## 2019-07-09 DIAGNOSIS — E669 Obesity, unspecified: Secondary | ICD-10-CM | POA: Diagnosis not present

## 2019-07-09 DIAGNOSIS — I208 Other forms of angina pectoris: Secondary | ICD-10-CM | POA: Diagnosis not present

## 2019-07-09 DIAGNOSIS — I739 Peripheral vascular disease, unspecified: Secondary | ICD-10-CM | POA: Diagnosis not present

## 2019-07-09 DIAGNOSIS — M545 Low back pain: Secondary | ICD-10-CM | POA: Diagnosis not present

## 2019-07-09 DIAGNOSIS — I10 Essential (primary) hypertension: Secondary | ICD-10-CM | POA: Diagnosis not present

## 2019-07-17 DIAGNOSIS — I480 Paroxysmal atrial fibrillation: Secondary | ICD-10-CM | POA: Diagnosis not present

## 2019-07-17 DIAGNOSIS — I209 Angina pectoris, unspecified: Secondary | ICD-10-CM | POA: Diagnosis not present

## 2019-07-17 DIAGNOSIS — I10 Essential (primary) hypertension: Secondary | ICD-10-CM | POA: Diagnosis not present

## 2019-07-17 DIAGNOSIS — R9439 Abnormal result of other cardiovascular function study: Secondary | ICD-10-CM | POA: Diagnosis not present

## 2019-07-17 DIAGNOSIS — F419 Anxiety disorder, unspecified: Secondary | ICD-10-CM | POA: Diagnosis not present

## 2019-07-17 DIAGNOSIS — E785 Hyperlipidemia, unspecified: Secondary | ICD-10-CM | POA: Diagnosis not present

## 2019-07-21 DIAGNOSIS — G894 Chronic pain syndrome: Secondary | ICD-10-CM | POA: Diagnosis not present

## 2019-07-21 DIAGNOSIS — E7849 Other hyperlipidemia: Secondary | ICD-10-CM | POA: Diagnosis not present

## 2019-07-21 DIAGNOSIS — M5136 Other intervertebral disc degeneration, lumbar region: Secondary | ICD-10-CM | POA: Diagnosis not present

## 2019-07-21 DIAGNOSIS — I10 Essential (primary) hypertension: Secondary | ICD-10-CM | POA: Diagnosis not present

## 2019-07-21 DIAGNOSIS — Z6829 Body mass index (BMI) 29.0-29.9, adult: Secondary | ICD-10-CM | POA: Diagnosis not present

## 2019-08-18 DIAGNOSIS — R351 Nocturia: Secondary | ICD-10-CM | POA: Diagnosis not present

## 2019-08-18 DIAGNOSIS — G894 Chronic pain syndrome: Secondary | ICD-10-CM | POA: Diagnosis not present

## 2019-08-18 DIAGNOSIS — E7849 Other hyperlipidemia: Secondary | ICD-10-CM | POA: Diagnosis not present

## 2019-08-18 DIAGNOSIS — Z6829 Body mass index (BMI) 29.0-29.9, adult: Secondary | ICD-10-CM | POA: Diagnosis not present

## 2019-08-18 DIAGNOSIS — E663 Overweight: Secondary | ICD-10-CM | POA: Diagnosis not present

## 2019-09-21 DIAGNOSIS — M5136 Other intervertebral disc degeneration, lumbar region: Secondary | ICD-10-CM | POA: Diagnosis not present

## 2019-09-21 DIAGNOSIS — E7849 Other hyperlipidemia: Secondary | ICD-10-CM | POA: Diagnosis not present

## 2019-09-21 DIAGNOSIS — E6609 Other obesity due to excess calories: Secondary | ICD-10-CM | POA: Diagnosis not present

## 2019-09-21 DIAGNOSIS — I10 Essential (primary) hypertension: Secondary | ICD-10-CM | POA: Diagnosis not present

## 2019-09-21 DIAGNOSIS — Z6830 Body mass index (BMI) 30.0-30.9, adult: Secondary | ICD-10-CM | POA: Diagnosis not present

## 2019-10-20 DIAGNOSIS — G894 Chronic pain syndrome: Secondary | ICD-10-CM | POA: Diagnosis not present

## 2019-10-20 DIAGNOSIS — I471 Supraventricular tachycardia: Secondary | ICD-10-CM | POA: Diagnosis not present

## 2019-10-20 DIAGNOSIS — E6609 Other obesity due to excess calories: Secondary | ICD-10-CM | POA: Diagnosis not present

## 2019-10-20 DIAGNOSIS — Z6830 Body mass index (BMI) 30.0-30.9, adult: Secondary | ICD-10-CM | POA: Diagnosis not present

## 2019-10-20 DIAGNOSIS — F1010 Alcohol abuse, uncomplicated: Secondary | ICD-10-CM | POA: Diagnosis not present

## 2019-11-17 DIAGNOSIS — R232 Flushing: Secondary | ICD-10-CM | POA: Diagnosis not present

## 2019-11-17 DIAGNOSIS — Z6830 Body mass index (BMI) 30.0-30.9, adult: Secondary | ICD-10-CM | POA: Diagnosis not present

## 2019-11-17 DIAGNOSIS — G894 Chronic pain syndrome: Secondary | ICD-10-CM | POA: Diagnosis not present

## 2019-11-17 DIAGNOSIS — I471 Supraventricular tachycardia: Secondary | ICD-10-CM | POA: Diagnosis not present

## 2019-11-20 DIAGNOSIS — R232 Flushing: Secondary | ICD-10-CM | POA: Diagnosis not present

## 2019-11-20 DIAGNOSIS — E6609 Other obesity due to excess calories: Secondary | ICD-10-CM | POA: Diagnosis not present

## 2019-11-20 DIAGNOSIS — E7849 Other hyperlipidemia: Secondary | ICD-10-CM | POA: Diagnosis not present

## 2019-11-20 DIAGNOSIS — Z6830 Body mass index (BMI) 30.0-30.9, adult: Secondary | ICD-10-CM | POA: Diagnosis not present

## 2019-12-13 DIAGNOSIS — K219 Gastro-esophageal reflux disease without esophagitis: Secondary | ICD-10-CM | POA: Diagnosis not present

## 2019-12-13 DIAGNOSIS — M10041 Idiopathic gout, right hand: Secondary | ICD-10-CM | POA: Diagnosis not present

## 2019-12-13 DIAGNOSIS — I10 Essential (primary) hypertension: Secondary | ICD-10-CM | POA: Diagnosis not present

## 2019-12-13 DIAGNOSIS — F17200 Nicotine dependence, unspecified, uncomplicated: Secondary | ICD-10-CM | POA: Diagnosis not present

## 2019-12-13 DIAGNOSIS — I4891 Unspecified atrial fibrillation: Secondary | ICD-10-CM | POA: Diagnosis not present

## 2019-12-19 DIAGNOSIS — E6609 Other obesity due to excess calories: Secondary | ICD-10-CM | POA: Diagnosis not present

## 2019-12-19 DIAGNOSIS — G894 Chronic pain syndrome: Secondary | ICD-10-CM | POA: Diagnosis not present

## 2019-12-19 DIAGNOSIS — Z6830 Body mass index (BMI) 30.0-30.9, adult: Secondary | ICD-10-CM | POA: Diagnosis not present

## 2019-12-19 DIAGNOSIS — M109 Gout, unspecified: Secondary | ICD-10-CM | POA: Diagnosis not present

## 2020-01-16 DIAGNOSIS — I10 Essential (primary) hypertension: Secondary | ICD-10-CM | POA: Diagnosis not present

## 2020-01-16 DIAGNOSIS — I209 Angina pectoris, unspecified: Secondary | ICD-10-CM | POA: Diagnosis not present

## 2020-01-16 DIAGNOSIS — I480 Paroxysmal atrial fibrillation: Secondary | ICD-10-CM | POA: Diagnosis not present

## 2020-01-18 DIAGNOSIS — G894 Chronic pain syndrome: Secondary | ICD-10-CM | POA: Diagnosis not present

## 2020-01-18 DIAGNOSIS — Z6830 Body mass index (BMI) 30.0-30.9, adult: Secondary | ICD-10-CM | POA: Diagnosis not present

## 2020-01-18 DIAGNOSIS — G508 Other disorders of trigeminal nerve: Secondary | ICD-10-CM | POA: Diagnosis not present

## 2020-01-18 DIAGNOSIS — Z23 Encounter for immunization: Secondary | ICD-10-CM | POA: Diagnosis not present

## 2020-01-18 DIAGNOSIS — I471 Supraventricular tachycardia: Secondary | ICD-10-CM | POA: Diagnosis not present

## 2020-02-16 DIAGNOSIS — G894 Chronic pain syndrome: Secondary | ICD-10-CM | POA: Diagnosis not present

## 2020-02-16 DIAGNOSIS — I480 Paroxysmal atrial fibrillation: Secondary | ICD-10-CM | POA: Diagnosis not present

## 2020-02-16 DIAGNOSIS — M5136 Other intervertebral disc degeneration, lumbar region: Secondary | ICD-10-CM | POA: Diagnosis not present

## 2020-02-16 DIAGNOSIS — Z6832 Body mass index (BMI) 32.0-32.9, adult: Secondary | ICD-10-CM | POA: Diagnosis not present

## 2020-02-16 DIAGNOSIS — E7849 Other hyperlipidemia: Secondary | ICD-10-CM | POA: Diagnosis not present

## 2020-02-16 DIAGNOSIS — I10 Essential (primary) hypertension: Secondary | ICD-10-CM | POA: Diagnosis not present

## 2020-02-16 DIAGNOSIS — F1011 Alcohol abuse, in remission: Secondary | ICD-10-CM | POA: Diagnosis not present

## 2020-02-21 DIAGNOSIS — Z1211 Encounter for screening for malignant neoplasm of colon: Secondary | ICD-10-CM | POA: Diagnosis not present

## 2020-03-15 DIAGNOSIS — E6609 Other obesity due to excess calories: Secondary | ICD-10-CM | POA: Diagnosis not present

## 2020-03-15 DIAGNOSIS — Z6834 Body mass index (BMI) 34.0-34.9, adult: Secondary | ICD-10-CM | POA: Diagnosis not present

## 2020-03-15 DIAGNOSIS — G894 Chronic pain syndrome: Secondary | ICD-10-CM | POA: Diagnosis not present

## 2020-03-15 DIAGNOSIS — R609 Edema, unspecified: Secondary | ICD-10-CM | POA: Diagnosis not present

## 2020-04-18 DIAGNOSIS — M5136 Other intervertebral disc degeneration, lumbar region: Secondary | ICD-10-CM | POA: Diagnosis not present

## 2020-04-18 DIAGNOSIS — M25561 Pain in right knee: Secondary | ICD-10-CM | POA: Diagnosis not present

## 2020-04-18 DIAGNOSIS — Z6835 Body mass index (BMI) 35.0-35.9, adult: Secondary | ICD-10-CM | POA: Diagnosis not present

## 2020-04-18 DIAGNOSIS — G894 Chronic pain syndrome: Secondary | ICD-10-CM | POA: Diagnosis not present

## 2020-04-30 ENCOUNTER — Encounter: Payer: Self-pay | Admitting: Orthopaedic Surgery

## 2020-04-30 ENCOUNTER — Other Ambulatory Visit: Payer: Self-pay

## 2020-04-30 ENCOUNTER — Ambulatory Visit (INDEPENDENT_AMBULATORY_CARE_PROVIDER_SITE_OTHER): Payer: Self-pay | Admitting: Orthopaedic Surgery

## 2020-04-30 ENCOUNTER — Ambulatory Visit: Payer: Medicare Other

## 2020-04-30 VITALS — BP 135/80 | HR 68 | Ht 66.0 in | Wt 227.0 lb

## 2020-04-30 DIAGNOSIS — G8929 Other chronic pain: Secondary | ICD-10-CM

## 2020-04-30 DIAGNOSIS — M25561 Pain in right knee: Secondary | ICD-10-CM

## 2020-04-30 MED ORDER — NAPROXEN 500 MG PO TABS: 500.0000 mg | ORAL_TABLET | Freq: Two times a day (BID) | ORAL | 5 refills | Status: DC

## 2020-04-30 NOTE — Progress Notes (Signed)
Patient ER:Calvin Henry, male DOB:June 12, 1960, 60 y.o. PYP:[id number]  Chief Complaint  Patient presents with  . Knee Pain    Right/ for years     HPI  Calvin Henry is a 60 y.o. male who has increasing pain of the right knee.  He has swelling and giving way.  He has pain at night.  He is not getting better.  Advil, rest, heat and ice do not help.   Body mass index is 36.64 kg/m.  ROS  Review of Systems  Constitutional: Positive for activity change.  Musculoskeletal: Positive for arthralgias, gait problem and joint swelling.  All other systems reviewed and are negative.   All other systems reviewed and are negative.  The following is a summary of the past history medically, past history surgically, known current medicines, social history and family history.  This information is gathered electronically by the computer from prior information and documentation.  I review this each visit and have found including this information at this point in the chart is beneficial and informative.    Past Medical History:  Diagnosis Date  . Acid reflux   . Arthritis   . Atrial flutter ([place])    a. diagnosed in 02/2017 with spontaneous conversion back to NSR.   Marland Kitchen Chronic back pain   . Essential hypertension   . H. pylori infection    Treated with Prevpac  . Hemorrhoids   . PSVT (paroxysmal supraventricular tachycardia) ([place])    a. s/p ablation in 12/2016  . Tubular adenoma     Past Surgical History:  Procedure Laterality Date  . COLONOSCOPY  08/07/10   friable anal canal otherwise normal  . COLONOSCOPY [name] 03/18/2017   Procedure: COLONOSCOPY;  Surgeon: Daneil Dolin, MD;  [facility];  Service: Endoscopy;  Laterality: [name];  1:15pm  . COLONOSCOPY WITH ESOPHAGOGASTRODUODENOSCOPY (EGD) [name] 07/06/2013   Dr. Gala Romney- inflamed hemorrhoids- multiple colonic polyps= tubular adenoma. stomach bx= hpylori treated with prevpac  . ESOPHAGOGASTRODUODENOSCOPY  08/07/10    schatzkis ring otherwise normal, s/p 56-F dilation, small hiatal hernia.chronic active gastritis on bx  . HEMORRHOID BANDING    . HERNIA REPAIR    . SVT ABLATION [name] 12/15/2016   Procedure: SVT Ablation;  Surgeon: [name], MD;  Location: [facility];  Service: Cardiovascular;  Laterality: [name];  . TOTAL HIP ARTHROPLASTY Left 10/04/2015   Procedure: LEFT TOTAL HIP ARTHROPLASTY ANTERIOR APPROACH;  Surgeon: Mcarthur Rossetti, MD;  [facility];  Service: Orthopedics;  Laterality: Left;  . UMBILICAL HERNIA REPAIR  08/05/2011   Procedure: HERNIA REPAIR UMBILICAL ADULT;  Surgeon: Jamesetta So, MD;  Location: [facility];  Service: General;  Laterality: [name];    Family History  Problem Relation Age of Onset  . Diabetes Mother   . Hypertension Mother   . Colon cancer Neg Hx     Social History Social History   Tobacco Use  . Smoking status: Current Some Day Smoker    Packs/day: 0.50    Years: 10.00    Pack years: 5.00    Types: Cigars    Last attempt to quit: 12/01/1986    Years since quitting: 33.4  . Smokeless tobacco: Never Used  . Tobacco comment: Quit x 20 plus years  Vaping Use  . Vaping Use: Never used  Substance Use Topics  . Alcohol use: Yes    Comment: a beer 1-2 times a week  . Drug use: No    Comment: remote marijuana  use     No Known Allergies  Current Outpatient Medications  Medication Sig Dispense Refill  . amLODipine (NORVASC) 10 MG tablet Take by mouth.    Marland Kitchen aspirin EC 81 MG tablet Take 81 mg by mouth daily.    Marland Kitchen atorvastatin (LIPITOR) 20 MG tablet Take 2 tablets by mouth daily.    Marland Kitchen ibuprofen (ADVIL,MOTRIN) 200 MG tablet Take 400 mg by mouth daily as needed for headache or moderate pain.    . isosorbide mononitrate (IMDUR) 30 MG 24 hr tablet Take by mouth.    . losartan (COZAAR) 100 MG tablet Take 100 mg by mouth daily.    . metoprolol succinate (TOPROL-XL) 50 MG 24 hr tablet TAKE 1 TABLET BY MOUTH DAILY 90 tablet 3  . oxyCODONE-acetaminophen  (PERCOCET) 10-325 MG tablet Take 1 tablet by mouth every 4 (four) hours as needed for pain.    . naproxen (NAPROSYN) 500 MG tablet Take 1 tablet (500 mg total) by mouth 2 (two) times daily with a meal. 60 tablet 5  . olmesartan (BENICAR) 40 MG tablet Take by mouth.     No current facility-administered medications for this visit.     Physical Exam  Blood pressure 135/80, pulse 68, height 5\' 6"  (1.676 m), weight 227 lb (103 kg).  Constitutional: overall normal hygiene, normal nutrition, well developed, normal grooming, normal body habitus. Assistive device:none  Musculoskeletal: gait and station Limp right, muscle tone and strength are normal, no tremors or atrophy is present.  .  Neurological: coordination overall normal.  Deep tendon reflex/nerve stretch intact.  Sensation normal.  Cranial nerves II-XII intact.   Skin:   Normal overall no scars, lesions, ulcers or rashes. No psoriasis.  Psychiatric: Alert and oriented x 3.  Recent memory intact, remote memory unclear.  Normal mood and affect. Well groomed.  Good eye contact.  Cardiovascular: overall no swelling, no varicosities, no edema bilaterally, normal temperatures of the legs and arms, no clubbing, cyanosis and good capillary refill.  Lymphatic: palpation is normal.  Right knee with pain, crepitus, effusion, ROM 0 to 105, positive medial [place], limp right, NV intact.  All other systems reviewed and are negative   The patient has been educated about the nature of the problem(s) and counseled on treatment options.  The patient appeared to understand what I have discussed and is in agreement with it.  Encounter Diagnosis  Name Primary?  . Chronic pain of right knee Yes    PROCEDURE NOTE:  The patient requests injections of the right knee , verbal consent was obtained.  The right knee was prepped appropriately after time out was performed.   Sterile technique was observed and injection of 1 cc of Depo-Medrol 40 mg  with several cc's of plain xylocaine. Anesthesia was provided by ethyl chloride and a 20-gauge needle was used to inject the knee area. The injection was tolerated well.  A band aid dressing was applied.  The patient was advised to apply ice later today and tomorrow to the injection sight as needed.  PLAN Call if any problems.  Precautions discussed.  Continue current medications.   Return to clinic 2 weeks   I will begin Naprosyn 500 po bid pc  Get MRI of the right knee.  Electronically Signed Sanjuana Kava, MD 1/18/202211:30 AM

## 2020-05-08 ENCOUNTER — Other Ambulatory Visit: Payer: Self-pay

## 2020-05-08 ENCOUNTER — Ambulatory Visit (HOSPITAL_COMMUNITY)
Admission: RE | Admit: 2020-05-08 | Discharge: 2020-05-08 | Disposition: A | Discharge status: Home / Self Care | Payer: Medicare Other | Source: Ambulatory Visit | Attending: Orthopaedic Surgery | Admitting: Orthopaedic Surgery

## 2020-05-08 DIAGNOSIS — G8929 Other chronic pain: Secondary | ICD-10-CM | POA: Diagnosis not present

## 2020-05-08 DIAGNOSIS — M25561 Pain in right knee: Secondary | ICD-10-CM | POA: Insufficient documentation

## 2020-05-14 ENCOUNTER — Other Ambulatory Visit: Payer: Self-pay

## 2020-05-14 ENCOUNTER — Encounter: Payer: Self-pay | Admitting: Orthopaedic Surgery

## 2020-05-14 ENCOUNTER — Ambulatory Visit (INDEPENDENT_AMBULATORY_CARE_PROVIDER_SITE_OTHER): Payer: Medicare Other | Admitting: Orthopaedic Surgery

## 2020-05-14 VITALS — BP 132/77 | HR 56 | Ht 66.0 in | Wt 227.0 lb

## 2020-05-14 DIAGNOSIS — M25561 Pain in right knee: Secondary | ICD-10-CM

## 2020-05-14 DIAGNOSIS — M7051 Other bursitis of knee, right knee: Secondary | ICD-10-CM

## 2020-05-14 DIAGNOSIS — G8929 Other chronic pain: Secondary | ICD-10-CM | POA: Diagnosis not present

## 2020-05-14 MED ORDER — PREDNISONE 5 MG (21) PO TBPK: ORAL_TABLET | ORAL | 0 refills | Status: DC

## 2020-05-14 NOTE — Progress Notes (Signed)
Patient Calvin Henry, male DOB:1960-09-28, 60 y.o. WFU:[id number]  Chief Complaint  Patient presents with  . Knee Pain    Right/ review MRI     HPI  Calvin Henry is a 60 y.o. male who has continued pain of the right knee.  He had MRI which showed: IMPRESSION: 1. Pes anserine bursitis and semimembranosus-tibial collateral ligament bursitis. Potential small adjacent Baker's cyst. 2. Small knee effusion with focal synovitis just above the patella and medial to the medial patellar facet adjacent to a thin medial plica. 3. Mild degenerative chondral thinning in the medial and patellofemoral compartments. 4. Trace MCL bursitis.  I have explained the findings to him.  I will begin prednisone dose pack.  He does not want an injection today.  I have independently reviewed the MRI.        Body mass index is 36.64 kg/m.  ROS  Review of Systems  Constitutional: Positive for activity change.  Musculoskeletal: Positive for arthralgias, gait problem and joint swelling.  All other systems reviewed and are negative.   All other systems reviewed and are negative.  The following is a summary of the past history medically, past history surgically, known current medicines, social history and family history.  This information is gathered electronically by the computer from prior information and documentation.  I review this each visit and have found including this information at this point in the chart is beneficial and informative.    Past Medical History:  Diagnosis Date  . Acid reflux   . Arthritis   . Atrial flutter ([place])    a. diagnosed in 02/2017 with spontaneous conversion back to NSR.   Marland Kitchen Chronic back pain   . Essential hypertension   . H. pylori infection    Treated with Prevpac  . Hemorrhoids   . PSVT (paroxysmal supraventricular tachycardia) ([place])    a. s/p ablation in 12/2016  . Tubular adenoma     Past Surgical History:  Procedure Laterality Date   . COLONOSCOPY  08/07/10   friable anal canal otherwise normal  . COLONOSCOPY [name] 03/18/2017   Procedure: COLONOSCOPY;  Surgeon: Daneil Dolin, MD;  [facility];  Service: Endoscopy;  Laterality: [name];  1:15pm  . COLONOSCOPY WITH ESOPHAGOGASTRODUODENOSCOPY (EGD) [name] 07/06/2013   Dr. Gala Romney- inflamed hemorrhoids- multiple colonic polyps= tubular adenoma. stomach bx= hpylori treated with prevpac  . ESOPHAGOGASTRODUODENOSCOPY  08/07/10   schatzkis ring otherwise normal, s/p 56-F dilation, small hiatal hernia.chronic active gastritis on bx  . HEMORRHOID BANDING    . HERNIA REPAIR    . SVT ABLATION [name] 12/15/2016   Procedure: SVT Ablation;  Surgeon: [name], MD;  Location: [place] [facility];  Service: Cardiovascular;  Laterality: [name];  . TOTAL HIP ARTHROPLASTY Left 10/04/2015   Procedure: LEFT TOTAL HIP ARTHROPLASTY ANTERIOR APPROACH;  Surgeon: Mcarthur Rossetti, MD;  Location: [facility];  Service: Orthopedics;  Laterality: Left;  . UMBILICAL HERNIA REPAIR  08/05/2011   Procedure: HERNIA REPAIR UMBILICAL ADULT;  Surgeon: Jamesetta So, MD;  Location: [facility];  Service: General;  Laterality: [name];    Family History  Problem Relation Age of Onset  . Diabetes Mother   . Hypertension Mother   . Colon cancer Neg Hx     Social History Social History   Tobacco Use  . Smoking status: Current Some Day Smoker    Packs/day: 0.50    Years: 10.00    Pack years: 5.00    Types: Cigars  Last attempt to quit: 12/01/1986    Years since quitting: 33.4  . Smokeless tobacco: Never Used  . Tobacco comment: Quit x 20 plus years  Vaping Use  . Vaping Use: Never used  Substance Use Topics  . Alcohol use: Yes    Comment: a beer 1-2 times a week  . Drug use: No    Comment: remote marijuana use     No Known Allergies  Current Outpatient Medications  Medication Sig Dispense Refill  . amLODipine (NORVASC) 10 MG tablet Take by mouth.    Marland Kitchen aspirin EC 81 MG tablet Take 81 mg by mouth  daily.    Marland Kitchen atorvastatin (LIPITOR) 20 MG tablet Take 2 tablets by mouth daily.    Marland Kitchen ibuprofen (ADVIL,MOTRIN) 200 MG tablet Take 400 mg by mouth daily as needed for headache or moderate pain.    . isosorbide mononitrate (IMDUR) 30 MG 24 hr tablet Take by mouth.    . losartan (COZAAR) 100 MG tablet Take 100 mg by mouth daily.    . metoprolol succinate (TOPROL-XL) 50 MG 24 hr tablet TAKE 1 TABLET BY MOUTH DAILY 90 tablet 3  . naproxen (NAPROSYN) 500 MG tablet Take 1 tablet (500 mg total) by mouth 2 (two) times daily with a meal. 60 tablet 5  . olmesartan (BENICAR) 40 MG tablet Take by mouth.    . oxyCODONE-acetaminophen (PERCOCET) 10-325 MG tablet Take 1 tablet by mouth every 4 (four) hours as needed for pain.    . predniSONE (STERAPRED UNI-PAK 21 TAB) 5 MG (21) TBPK tablet Take 6 pills first day; 5 pills second day; 4 pills third day; 3 pills fourth day; 2 pills next day and 1 pill last day. 21 tablet 0   No current facility-administered medications for this visit.     Physical Exam  Blood pressure 132/77, pulse (!) 56, height 5\' 6"  (1.676 m), weight 227 lb (103 kg).  Constitutional: overall normal hygiene, normal nutrition, well developed, normal grooming, normal body habitus. Assistive device:none  Musculoskeletal: gait and station Limp right, muscle tone and strength are normal, no tremors or atrophy is present.  .  Neurological: coordination overall normal.  Deep tendon reflex/nerve stretch intact.  Sensation normal.  Cranial nerves II-XII intact.   Skin:   Normal overall no scars, lesions, ulcers or rashes. No psoriasis.  Psychiatric: Alert and oriented x 3.  Recent memory intact, remote memory unclear.  Normal mood and affect. Well groomed.  Good eye contact.  Cardiovascular: overall no swelling, no varicosities, no edema bilaterally, normal temperatures of the legs and arms, no clubbing, cyanosis and good capillary refill.  Lymphatic: palpation is normal.  Right knee tender,  more anterior and laterally over bursa.  NV intact.  Limp right.    All other systems reviewed and are negative   The patient has been educated about the nature of the problem(s) and counseled on treatment options.  The patient appeared to understand what I have discussed and is in agreement with it.  Encounter Diagnoses  Name Primary?  . Chronic pain of right knee Yes  . Pes anserinus bursitis of right knee     PLAN Call if any problems.  Precautions discussed.  Continue current medications.   Return to clinic 2 weeks   Begin prednisone dose pack.  Electronically Signed Sanjuana Kava, MD 2/1/202210:23 AM

## 2020-05-14 NOTE — Patient Instructions (Signed)
Use Aspercreme, Biofreeze or Voltaren gel over the counter 2-3 times daily make sure you rub it in well each time you use it.

## 2020-05-16 DIAGNOSIS — G894 Chronic pain syndrome: Secondary | ICD-10-CM | POA: Diagnosis not present

## 2020-05-16 DIAGNOSIS — Z6836 Body mass index (BMI) 36.0-36.9, adult: Secondary | ICD-10-CM | POA: Diagnosis not present

## 2020-05-16 DIAGNOSIS — E6609 Other obesity due to excess calories: Secondary | ICD-10-CM | POA: Diagnosis not present

## 2020-05-28 ENCOUNTER — Other Ambulatory Visit: Payer: Self-pay

## 2020-05-28 ENCOUNTER — Encounter: Payer: Self-pay | Admitting: Orthopaedic Surgery

## 2020-05-28 ENCOUNTER — Ambulatory Visit (INDEPENDENT_AMBULATORY_CARE_PROVIDER_SITE_OTHER): Payer: Medicare Other | Admitting: Orthopaedic Surgery

## 2020-05-28 VITALS — BP 126/79 | HR 77 | Ht 66.0 in | Wt 236.1 lb

## 2020-05-28 DIAGNOSIS — G8929 Other chronic pain: Secondary | ICD-10-CM | POA: Diagnosis not present

## 2020-05-28 DIAGNOSIS — M7051 Other bursitis of knee, right knee: Secondary | ICD-10-CM | POA: Diagnosis not present

## 2020-05-28 DIAGNOSIS — M25561 Pain in right knee: Secondary | ICD-10-CM

## 2020-05-28 NOTE — Progress Notes (Signed)
Patient OV:Calvin Henry, male DOB:[birth year], 60 y.o. YDX:[id number]  Chief Complaint  Patient presents with  . Knee Pain    Its a little better but still bothers me some    HPI  Calvin Henry is a 60 y.o. male who has right knee pain and pes anserinus bursitis.  He is a little better.  The prednisone dose pack helped but he still has some pain at times, more with the cold weather.  He has no redness, no trauma.     Body mass index is 38.11 kg/m.  ROS  Review of Systems  Constitutional: Positive for activity change.  Musculoskeletal: Positive for arthralgias, gait problem and joint swelling.  All other systems reviewed and are negative.   All other systems reviewed and are negative.  The following is a summary of the past history medically, past history surgically, known current medicines, social history and family history.  This information is gathered electronically by the computer from prior information and documentation.  I review this each visit and have found including this information at this point in the chart is beneficial and informative.    Past Medical History:  Diagnosis Date  . Acid reflux   . Arthritis   . Atrial flutter ([place])    a. diagnosed in 02/2017 with spontaneous conversion back to NSR.   Marland Kitchen Chronic back pain   . Essential hypertension   . H. pylori infection    Treated with Prevpac  . Hemorrhoids   . PSVT (paroxysmal supraventricular tachycardia) ([place])    a. s/p ablation in 12/2016  . Tubular adenoma     Past Surgical History:  Procedure Laterality Date  . COLONOSCOPY  08/07/10   friable anal canal otherwise normal  . COLONOSCOPY [name] 03/18/2017   Procedure: COLONOSCOPY;  Surgeon: Daneil Dolin, MD;  [facility];  Service: Endoscopy;  Laterality: [name];  1:15pm  . COLONOSCOPY WITH ESOPHAGOGASTRODUODENOSCOPY (EGD) [name] 07/06/2013   Dr. Gala Romney- inflamed hemorrhoids- multiple colonic polyps= tubular adenoma. stomach bx= hpylori  treated with prevpac  . ESOPHAGOGASTRODUODENOSCOPY  08/07/10   schatzkis ring otherwise normal, s/p 56-F dilation, small hiatal hernia.chronic active gastritis on bx  . HEMORRHOID BANDING    . HERNIA REPAIR    . SVT ABLATION [name] 12/15/2016   Procedure: SVT Ablation;  Surgeon: [name], MD;  Location: [place] [facility];  Service: Cardiovascular;  Laterality: [name];  . TOTAL HIP ARTHROPLASTY Left 10/04/2015   Procedure: LEFT TOTAL HIP ARTHROPLASTY ANTERIOR APPROACH;  Surgeon: Mcarthur Rossetti, MD;  [facility];  Service: Orthopedics;  Laterality: Left;  . UMBILICAL HERNIA REPAIR  08/05/2011   Procedure: HERNIA REPAIR UMBILICAL ADULT;  Surgeon: Jamesetta So, MD;  Location: [facility];  Service: General;  Laterality: [name];    Family History  Problem Relation Age of Onset  . Diabetes Mother   . Hypertension Mother   . Colon cancer Neg Hx     Social History Social History   Tobacco Use  . Smoking status: Current Some Day Smoker    Packs/day: 0.50    Years: 10.00    Pack years: 5.00    Types: Cigars    Last attempt to quit: 12/01/1986    Years since quitting: 33.5  . Smokeless tobacco: Never Used  . Tobacco comment: Quit x 20 plus years  Vaping Use  . Vaping Use: Never used  Substance Use Topics  . Alcohol use: Yes    Comment: a beer 1-2 times a  week  . Drug use: No    Comment: remote marijuana use     No Known Allergies  Current Outpatient Medications  Medication Sig Dispense Refill  . amLODipine (NORVASC) 10 MG tablet Take by mouth.    Marland Kitchen aspirin EC 81 MG tablet Take 81 mg by mouth daily.    Marland Kitchen atorvastatin (LIPITOR) 20 MG tablet Take 2 tablets by mouth daily.    Marland Kitchen ibuprofen (ADVIL,MOTRIN) 200 MG tablet Take 400 mg by mouth daily as needed for headache or moderate pain.    . isosorbide mononitrate (IMDUR) 30 MG 24 hr tablet Take by mouth.    . losartan (COZAAR) 100 MG tablet Take 100 mg by mouth daily.    . metoprolol succinate (TOPROL-XL) 50 MG 24 hr tablet TAKE 1  TABLET BY MOUTH DAILY 90 tablet 3  . naproxen (NAPROSYN) 500 MG tablet Take 1 tablet (500 mg total) by mouth 2 (two) times daily with a meal. 60 tablet 5  . olmesartan (BENICAR) 40 MG tablet Take by mouth.    . oxyCODONE-acetaminophen (PERCOCET) 10-325 MG tablet Take 1 tablet by mouth every 4 (four) hours as needed for pain.    . predniSONE (STERAPRED UNI-PAK 21 TAB) 5 MG (21) TBPK tablet Take 6 pills first day; 5 pills second day; 4 pills third day; 3 pills fourth day; 2 pills next day and 1 pill last day. 21 tablet 0   No current facility-administered medications for this visit.     Physical Exam  Blood pressure 126/79, pulse 77, height 5\' 6"  (1.676 m), weight 236 lb 2 oz (107.1 kg).  Constitutional: overall normal hygiene, normal nutrition, well developed, normal grooming, normal body habitus. Assistive device:none  Musculoskeletal: gait and station Limp none, muscle tone and strength are normal, no tremors or atrophy is present.  .  Neurological: coordination overall normal.  Deep tendon reflex/nerve stretch intact.  Sensation normal.  Cranial nerves II-XII intact.   Skin:   Normal overall no scars, lesions, ulcers or rashes. No psoriasis.  Psychiatric: Alert and oriented x 3.  Recent memory intact, remote memory unclear.  Normal mood and affect. Well groomed.  Good eye contact.  Cardiovascular: overall no swelling, no varicosities, no edema bilaterally, normal temperatures of the legs and arms, no clubbing, cyanosis and good capillary refill.  Lymphatic: palpation is normal.  He has some tenderness of the right patella area more than the pes anserinus area.  NV intact, ROM is nearly full.  Knee is stable.  All other systems reviewed and are negative   The patient has been educated about the nature of the problem(s) and counseled on treatment options.  The patient appeared to understand what I have discussed and is in agreement with it.  Encounter Diagnoses  Name Primary?  .  Pes anserinus bursitis of right knee Yes  . Chronic pain of right knee     PLAN Call if any problems.  Precautions discussed.  Continue current medications.   Return to clinic 1 month   Electronically Signed Sanjuana Kava, MD 2/15/20228:41 AM

## 2020-06-13 DIAGNOSIS — G894 Chronic pain syndrome: Secondary | ICD-10-CM | POA: Diagnosis not present

## 2020-06-13 DIAGNOSIS — Z6837 Body mass index (BMI) 37.0-37.9, adult: Secondary | ICD-10-CM | POA: Diagnosis not present

## 2020-06-13 DIAGNOSIS — Z1389 Encounter for screening for other disorder: Secondary | ICD-10-CM | POA: Diagnosis not present

## 2020-06-13 DIAGNOSIS — Z0001 Encounter for general adult medical examination with abnormal findings: Secondary | ICD-10-CM | POA: Diagnosis not present

## 2020-06-13 DIAGNOSIS — F1120 Opioid dependence, uncomplicated: Secondary | ICD-10-CM | POA: Diagnosis not present

## 2020-06-26 ENCOUNTER — Ambulatory Visit (INDEPENDENT_AMBULATORY_CARE_PROVIDER_SITE_OTHER): Payer: Medicare Other | Admitting: Orthopaedic Surgery

## 2020-06-26 ENCOUNTER — Other Ambulatory Visit: Payer: Self-pay

## 2020-06-26 ENCOUNTER — Encounter: Payer: Self-pay | Admitting: Orthopaedic Surgery

## 2020-06-26 VITALS — BP 126/79 | HR 76 | Ht 66.0 in | Wt 240.0 lb

## 2020-06-26 DIAGNOSIS — M76892 Other specified enthesopathies of left lower limb, excluding foot: Secondary | ICD-10-CM | POA: Diagnosis not present

## 2020-06-26 NOTE — Progress Notes (Signed)
Patient BP:[name], male DOB:[birth year], 60 y.o. DPO:[id number]  Chief Complaint  Patient presents with  . Knee Pain    R/has pain occasionally, Left knee is hurting now about a 8 on pain scale especially at knee. Wants injection.    HPI  Calvin Henry is a 60 y.o. male who has developed pain in the left knee.  He has had pain in the right knee in the past.  He has pain at the insertion of the left quads tendon on the patella area.  He has no redness, no swelling.  He said it has gotten worse over the last month.  He has tried Advil, ice and heat with only slight help.  He has no swelling of the knee.    Body mass index is 38.74 kg/m.  ROS  Review of Systems  All other systems reviewed and are negative.  The following is a summary of the past history medically, past history surgically, known current medicines, social history and family history.  This information is gathered electronically by the computer from prior information and documentation.  I review this each visit and have found including this information at this point in the chart is beneficial and informative.    Past Medical History:  Diagnosis Date  . Acid reflux   . Arthritis   . Atrial flutter ([place])    a. diagnosed in 02/2017 with spontaneous conversion back to NSR.   Marland Kitchen Chronic back pain   . Essential hypertension   . H. pylori infection    Treated with Prevpac  . Hemorrhoids   . PSVT (paroxysmal supraventricular tachycardia) ([place])    a. s/p ablation in 12/2016  . Tubular adenoma     Past Surgical History:  Procedure Laterality Date  . COLONOSCOPY  08/07/10   friable anal canal otherwise normal  . COLONOSCOPY [name] 03/18/2017   Procedure: COLONOSCOPY;  Surgeon: Daneil Dolin, MD;  [facility];  Service: Endoscopy;  Laterality: [name];  1:15pm  . COLONOSCOPY WITH ESOPHAGOGASTRODUODENOSCOPY (EGD) [name] 07/06/2013   Dr. Gala Romney- inflamed hemorrhoids- multiple colonic polyps= tubular adenoma.  stomach bx= hpylori treated with prevpac  . ESOPHAGOGASTRODUODENOSCOPY  08/07/10   schatzkis ring otherwise normal, s/p 56-F dilation, small hiatal hernia.chronic active gastritis on bx  . HEMORRHOID BANDING    . HERNIA REPAIR    . SVT ABLATION [name] 12/15/2016   Procedure: SVT Ablation;  Surgeon: [name], MD;  Location: [place] [facility];  Service: Cardiovascular;  Laterality: [name];  . TOTAL HIP ARTHROPLASTY Left 10/04/2015   Procedure: LEFT TOTAL HIP ARTHROPLASTY ANTERIOR APPROACH;  Surgeon: Mcarthur Rossetti, MD;  [facility];  Service: Orthopedics;  Laterality: Left;  . UMBILICAL HERNIA REPAIR  08/05/2011   Procedure: HERNIA REPAIR UMBILICAL ADULT;  Surgeon: Jamesetta So, MD;  Location: [facility];  Service: General;  Laterality: [name];    Family History  Problem Relation Age of Onset  . Diabetes Mother   . Hypertension Mother   . Colon cancer Neg Hx     Social History Social History   Tobacco Use  . Smoking status: Current Some Day Smoker    Packs/day: 0.50    Years: 10.00    Pack years: 5.00    Types: Cigars    Last attempt to quit: 12/01/1986    Years since quitting: 33.5  . Smokeless tobacco: Never Used  . Tobacco comment: Quit x 20 plus years  Vaping Use  . Vaping Use: Never used  Substance Use Topics  . Alcohol use: Yes    Comment: a beer 1-2 times a week  . Drug use: No    Comment: remote marijuana use     No Known Allergies  Current Outpatient Medications  Medication Sig Dispense Refill  . amLODipine (NORVASC) 10 MG tablet Take by mouth.    Marland Kitchen aspirin EC 81 MG tablet Take 81 mg by mouth daily.    Marland Kitchen atorvastatin (LIPITOR) 20 MG tablet Take 2 tablets by mouth daily.    Marland Kitchen ibuprofen (ADVIL,MOTRIN) 200 MG tablet Take 400 mg by mouth daily as needed for headache or moderate pain.    . isosorbide mononitrate (IMDUR) 30 MG 24 hr tablet Take by mouth.    . losartan (COZAAR) 100 MG tablet Take 100 mg by mouth daily.    . metoprolol succinate (TOPROL-XL) 50 MG  24 hr tablet TAKE 1 TABLET BY MOUTH DAILY 90 tablet 3  . naproxen (NAPROSYN) 500 MG tablet Take 1 tablet (500 mg total) by mouth 2 (two) times daily with a meal. 60 tablet 5  . olmesartan (BENICAR) 40 MG tablet Take by mouth.    . oxyCODONE-acetaminophen (PERCOCET) 10-325 MG tablet Take 1 tablet by mouth every 4 (four) hours as needed for pain.    . predniSONE (STERAPRED UNI-PAK 21 TAB) 5 MG (21) TBPK tablet Take 6 pills first day; 5 pills second day; 4 pills third day; 3 pills fourth day; 2 pills next day and 1 pill last day. 21 tablet 0   No current facility-administered medications for this visit.     Physical Exam  Blood pressure 126/79, pulse 76, height 5\' 6"  (1.676 m), weight 240 lb (108.9 kg).  Constitutional: overall normal hygiene, normal nutrition, well developed, normal grooming, normal body habitus. Assistive device:none  Musculoskeletal: gait and station Limp left, muscle tone and strength are normal, no tremors or atrophy is present.  .  Neurological: coordination overall normal.  Deep tendon reflex/nerve stretch intact.  Sensation normal.  Cranial nerves II-XII intact.   Skin:   Normal overall no scars, lesions, ulcers or rashes. No psoriasis.  Psychiatric: Alert and oriented x 3.  Recent memory intact, remote memory unclear.  Normal mood and affect. Well groomed.  Good eye contact.  Cardiovascular: overall no swelling, no varicosities, no edema bilaterally, normal temperatures of the legs and arms, no clubbing, cyanosis and good capillary refill.  Lymphatic: palpation is normal.  Left knee has tenderness at the quads insertion on the left patella.  There is no redness, no swelling.  He has a limp.  NV intact.  All other systems reviewed and are negative   The patient has been educated about the nature of the problem(s) and counseled on treatment options.  The patient appeared to understand what I have discussed and is in agreement with it.  Encounter Diagnosis   Name Primary?  . Tendinitis of left quadriceps tendon Yes   Procedure note: After permission from the patient, and prep of the proximal patella area on the left, I injected 1 % Xylocaine and 1 cc of Celestone 6 mgm into the area by sterile technique tolerated well.  PLAN Call if any problems.  Precautions discussed.  Continue current medications.   Return to clinic 2 weeks   Rub Aspercreme, Biofreeze or Voltaren Gel or other type of rub on the area three times a day.  Electronically Signed Sanjuana Kava, MD 3/16/20229:35 AM

## 2020-07-10 ENCOUNTER — Encounter: Payer: Self-pay | Admitting: Orthopaedic Surgery

## 2020-07-10 ENCOUNTER — Other Ambulatory Visit: Payer: Self-pay

## 2020-07-10 ENCOUNTER — Ambulatory Visit (INDEPENDENT_AMBULATORY_CARE_PROVIDER_SITE_OTHER): Payer: Medicare Other | Admitting: Orthopaedic Surgery

## 2020-07-10 VITALS — Ht 66.0 in | Wt 240.0 lb

## 2020-07-10 DIAGNOSIS — M25561 Pain in right knee: Secondary | ICD-10-CM

## 2020-07-10 DIAGNOSIS — G8929 Other chronic pain: Secondary | ICD-10-CM

## 2020-07-10 NOTE — Progress Notes (Signed)
Patient RX:[id number] [name], male DOB:27-Dec-1960, 60 y.o. YPP:[id number]  Chief Complaint  Patient presents with  . Leg Pain    L/ its doing much better, but now my right one wants to hurt.    HPI  Calvin Henry is a 60 y.o. male who had pain in the left knee.  That is much improved.  He has pain of the right knee today.  He has had pain in the right knee for several months. I got X-rays on it in January.  He had swelling then and has giving way now.  He says it pops.  While he was in church this past weekend he had an episode.  He has to watch how long he walks and be prepared to hold on to something if it gives way.   Body mass index is 38.74 kg/m.  ROS  Review of Systems  Constitutional: Positive for activity change.  Musculoskeletal: Positive for arthralgias, gait problem and joint swelling.  All other systems reviewed and are negative.   All other systems reviewed and are negative.  The following is a summary of the past history medically, past history surgically, known current medicines, social history and family history.  This information is gathered electronically by the computer from prior information and documentation.  I review this each visit and have found including this information at this point in the chart is beneficial and informative.    Past Medical History:  Diagnosis Date  . Acid reflux   . Arthritis   . Atrial flutter ([place])    a. diagnosed in 02/2017 with spontaneous conversion back to NSR.   Marland Kitchen Chronic back pain   . Essential hypertension   . H. pylori infection    Treated with Prevpac  . Hemorrhoids   . PSVT (paroxysmal supraventricular tachycardia) ([place])    a. s/p ablation in 12/2016  . Tubular adenoma     Past Surgical History:  Procedure Laterality Date  . COLONOSCOPY  08/07/10   friable anal canal otherwise normal  . COLONOSCOPY [name] 03/18/2017   Procedure: COLONOSCOPY;  Surgeon: Daneil Dolin, MD;  [facility];  Service:  Endoscopy;  Laterality: [name];  1:15pm  . COLONOSCOPY WITH ESOPHAGOGASTRODUODENOSCOPY (EGD) [name] 07/06/2013   Dr. Gala Romney- inflamed hemorrhoids- multiple colonic polyps= tubular adenoma. stomach bx= hpylori treated with prevpac  . ESOPHAGOGASTRODUODENOSCOPY  08/07/10   schatzkis ring otherwise normal, s/p 56-F dilation, small hiatal hernia.chronic active gastritis on bx  . HEMORRHOID BANDING    . HERNIA REPAIR    . SVT ABLATION [name] 12/15/2016   Procedure: SVT Ablation;  Surgeon: [name], MD;  Location: [facility];  Service: Cardiovascular;  Laterality: [name];  . TOTAL HIP ARTHROPLASTY Left 10/04/2015   Procedure: LEFT TOTAL HIP ARTHROPLASTY ANTERIOR APPROACH;  Surgeon: Mcarthur Rossetti, MD;  Location: [facility];  Service: Orthopedics;  Laterality: Left;  . UMBILICAL HERNIA REPAIR  08/05/2011   Procedure: HERNIA REPAIR UMBILICAL ADULT;  Surgeon: Jamesetta So, MD;  Location: [facility];  Service: General;  Laterality: [name];    Family History  Problem Relation Age of Onset  . Diabetes Mother   . Hypertension Mother   . Colon cancer Neg Hx     Social History Social History   Tobacco Use  . Smoking status: Current Some Day Smoker    Packs/day: 0.50    Years: 10.00    Pack years: 5.00    Types: Cigars    Last attempt to quit:  12/01/1986    Years since quitting: 33.6  . Smokeless tobacco: Never Used  . Tobacco comment: Quit x 20 plus years  Vaping Use  . Vaping Use: Never used  Substance Use Topics  . Alcohol use: Yes    Comment: a beer 1-2 times a week  . Drug use: No    Comment: remote marijuana use     No Known Allergies  Current Outpatient Medications  Medication Sig Dispense Refill  . amLODipine (NORVASC) 10 MG tablet Take by mouth.    Marland Kitchen aspirin EC 81 MG tablet Take 81 mg by mouth daily.    Marland Kitchen atorvastatin (LIPITOR) 20 MG tablet Take 2 tablets by mouth daily.    Marland Kitchen ibuprofen (ADVIL,MOTRIN) 200 MG tablet Take 400 mg by mouth daily as needed for headache or moderate pain.     . isosorbide mononitrate (IMDUR) 30 MG 24 hr tablet Take by mouth.    . losartan (COZAAR) 100 MG tablet Take 100 mg by mouth daily.    . metoprolol succinate (TOPROL-XL) 50 MG 24 hr tablet TAKE 1 TABLET BY MOUTH DAILY 90 tablet 3  . naproxen (NAPROSYN) 500 MG tablet Take 1 tablet (500 mg total) by mouth 2 (two) times daily with a meal. 60 tablet 5  . olmesartan (BENICAR) 40 MG tablet Take by mouth.    . oxyCODONE-acetaminophen (PERCOCET) 10-325 MG tablet Take 1 tablet by mouth every 4 (four) hours as needed for pain.    . predniSONE (STERAPRED UNI-PAK 21 TAB) 5 MG (21) TBPK tablet Take 6 pills first day; 5 pills second day; 4 pills third day; 3 pills fourth day; 2 pills next day and 1 pill last day. 21 tablet 0   No current facility-administered medications for this visit.     Physical Exam  Height 5\' 6"  (1.676 m), weight 240 lb (108.9 kg).  Constitutional: overall normal hygiene, normal nutrition, well developed, normal grooming, normal body habitus. Assistive device:none  Musculoskeletal: gait and station Limp right, muscle tone and strength are normal, no tremors or atrophy is present.  .  Neurological: coordination overall normal.  Deep tendon reflex/nerve stretch intact.  Sensation normal.  Cranial nerves II-XII intact.   Skin:   Normal overall no scars, lesions, ulcers or rashes. No psoriasis.  Psychiatric: Alert and oriented x 3.  Recent memory intact, remote memory unclear.  Normal mood and affect. Well groomed.  Good eye contact.  Cardiovascular: overall no swelling, no varicosities, no edema bilaterally, normal temperatures of the legs and arms, no clubbing, cyanosis and good capillary refill.  Lymphatic: palpation is normal.  Left knee has no pain today.  The right knee has ROM 0 to 110, pain medially, effusion, crepitus, positive medial Mc[place]ray.  He has limp to the right.  NV intact.  All other systems reviewed and are negative   The patient has been educated  about the nature of the problem(s) and counseled on treatment options.  The patient appeared to understand what I have discussed and is in agreement with it.  Encounter Diagnosis  Name Primary?  . Chronic pain of right knee Yes   PROCEDURE NOTE:  The patient requests injections of the right knee , verbal consent was obtained.  The right knee was prepped appropriately after time out was performed.   Sterile technique was observed and injection of 1 cc of Celestone 6 mg with several cc's of plain xylocaine. Anesthesia was provided by ethyl chloride and a 20-gauge needle was used to inject the  knee area. The injection was tolerated well.  A band aid dressing was applied.  The patient was advised to apply ice later today and tomorrow to the injection sight as needed.   PLAN Call if any problems.  Precautions discussed.  Continue current medications.   Return to clinic 2 weeks   Get MRI of the right knee.  Electronically Signed Sanjuana Kava, MD 3/30/20229:39 AM

## 2020-07-11 ENCOUNTER — Telehealth: Payer: Self-pay

## 2020-07-11 DIAGNOSIS — M25561 Pain in right knee: Secondary | ICD-10-CM

## 2020-07-11 DIAGNOSIS — G8929 Other chronic pain: Secondary | ICD-10-CM

## 2020-07-11 NOTE — Telephone Encounter (Signed)
[name], [name] [name], Calvin Henry, CMA Please place order for MRI Rt knee with diagnosis of instability,MMT and pain. Orders have been sent to [facility]   Happy Friday Eve!  Carlon

## 2020-07-12 DIAGNOSIS — I10 Essential (primary) hypertension: Secondary | ICD-10-CM | POA: Diagnosis not present

## 2020-07-12 DIAGNOSIS — M5136 Other intervertebral disc degeneration, lumbar region: Secondary | ICD-10-CM | POA: Diagnosis not present

## 2020-07-12 DIAGNOSIS — Z6837 Body mass index (BMI) 37.0-37.9, adult: Secondary | ICD-10-CM | POA: Diagnosis not present

## 2020-07-15 DIAGNOSIS — I480 Paroxysmal atrial fibrillation: Secondary | ICD-10-CM | POA: Diagnosis not present

## 2020-07-18 DIAGNOSIS — G8929 Other chronic pain: Secondary | ICD-10-CM | POA: Diagnosis not present

## 2020-07-18 DIAGNOSIS — M25561 Pain in right knee: Secondary | ICD-10-CM | POA: Diagnosis not present

## 2020-07-18 DIAGNOSIS — M7051 Other bursitis of knee, right knee: Secondary | ICD-10-CM | POA: Diagnosis not present

## 2020-07-24 ENCOUNTER — Other Ambulatory Visit: Payer: Self-pay

## 2020-07-24 ENCOUNTER — Ambulatory Visit (INDEPENDENT_AMBULATORY_CARE_PROVIDER_SITE_OTHER): Payer: Medicare Other | Admitting: Orthopaedic Surgery

## 2020-07-24 ENCOUNTER — Encounter: Payer: Self-pay | Admitting: Orthopaedic Surgery

## 2020-07-24 VITALS — Ht 66.0 in | Wt 240.0 lb

## 2020-07-24 DIAGNOSIS — M25561 Pain in right knee: Secondary | ICD-10-CM | POA: Diagnosis not present

## 2020-07-24 DIAGNOSIS — G8929 Other chronic pain: Secondary | ICD-10-CM

## 2020-07-24 MED ORDER — DICLOFENAC SODIUM 75 MG PO TBEC: 75.0000 mg | DELAYED_RELEASE_TABLET | Freq: Two times a day (BID) | ORAL | 2 refills | Status: DC

## 2020-07-24 NOTE — Progress Notes (Signed)
Patient Calvin Henry, male DOB:05/30/1960, 60 y.o. Calvin Henry  Chief Complaint  Patient presents with  . Knee Pain    R/ here to go over the MRI. There is no change; not able to sleep.    HPI  Calvin Henry is a 60 y.o. male who has continued pain of the right knee with swelling and giving way.  He had MRI of the right knee which showed no ligamentous or meniscus tear.  MRI in December showed the same.  He does have mild pes anserine bursitis.  He says he is no better.  He cannot sleep well secondary to knee pain. He is taking his Naprosyn.  His family doctor gives him pain medicine, Percocet 10, which he takes.  He says he is tired of hurting.  He uses Voltaren Gel at night often.  I have independently reviewed the MRI.  I have explained the findings to him.    Body mass index is 38.74 kg/m.  ROS  Review of Systems  Constitutional: Positive for activity change.  Musculoskeletal: Positive for arthralgias, gait problem and joint swelling.  All other systems reviewed and are negative.   All other systems reviewed and are negative.  The following is a summary of the past history medically, past history surgically, known current medicines, social history and family history.  This information is gathered electronically by the computer from prior information and documentation.  I review this each visit and have found including this information at this point in the chart is beneficial and informative.    Past Medical History:  Diagnosis Date  . Acid reflux   . Arthritis   . Atrial flutter ([place])    a. diagnosed in 02/2017 with spontaneous conversion back to NSR.   Marland Kitchen Chronic back pain   . Essential hypertension   . H. pylori infection    Treated with Prevpac  . Hemorrhoids   . PSVT (paroxysmal supraventricular tachycardia) ([place])    a. s/p ablation in 12/2016  . Tubular adenoma     Past Surgical History:  Procedure Laterality Date  . COLONOSCOPY  08/07/10    friable anal canal otherwise normal  . COLONOSCOPY [name] 03/18/2017   Procedure: COLONOSCOPY;  Surgeon: Daneil Dolin, MD;  [facility];  Service: Endoscopy;  Laterality: [name];  1:15pm  . COLONOSCOPY WITH ESOPHAGOGASTRODUODENOSCOPY (EGD) [name] 07/06/2013   Dr. Gala Romney- inflamed hemorrhoids- multiple colonic polyps= tubular adenoma. stomach bx= hpylori treated with prevpac  . ESOPHAGOGASTRODUODENOSCOPY  08/07/10   schatzkis ring otherwise normal, s/p 56-F dilation, small hiatal hernia.chronic active gastritis on bx  . HEMORRHOID BANDING    . HERNIA REPAIR    . SVT ABLATION [name] 12/15/2016   Procedure: SVT Ablation;  Surgeon: [name], MD;  Location: [place] [facility];  Service: Cardiovascular;  Laterality: [name];  . TOTAL HIP ARTHROPLASTY Left 10/04/2015   Procedure: LEFT TOTAL HIP ARTHROPLASTY ANTERIOR APPROACH;  Surgeon: Mcarthur Rossetti, MD;  Location: [facility];  Service: Orthopedics;  Laterality: Left;  . UMBILICAL HERNIA REPAIR  08/05/2011   Procedure: HERNIA REPAIR UMBILICAL ADULT;  Surgeon: Jamesetta So, MD;  Location: [facility];  Service: General;  Laterality: [name];    Family History  Problem Relation Age of Onset  . Diabetes Mother   . Hypertension Mother   . Colon cancer Neg Hx     Social History Social History   Tobacco Use  . Smoking status: Current Some Day Smoker    Packs/day: 0.50  Years: 10.00    Pack years: 5.00    Types: Cigars    Last attempt to quit: 12/01/1986    Years since quitting: 33.6  . Smokeless tobacco: Never Used  . Tobacco comment: Quit x 20 plus years  Vaping Use  . Vaping Use: Never used  Substance Use Topics  . Alcohol use: Yes    Comment: a beer 1-2 times a week  . Drug use: No    Comment: remote marijuana use     No Known Allergies  Current Outpatient Medications  Medication Sig Dispense Refill  . amLODipine (NORVASC) 10 MG tablet Take by mouth.    Marland Kitchen aspirin EC 81 MG tablet Take 81 mg by mouth daily.    Marland Kitchen atorvastatin  (LIPITOR) 20 MG tablet Take 2 tablets by mouth daily.    . diclofenac (VOLTAREN) 75 MG EC tablet Take 1 tablet (75 mg total) by mouth 2 (two) times daily with a meal. 60 tablet 2  . isosorbide mononitrate (IMDUR) 30 MG 24 hr tablet Take by mouth.    . losartan (COZAAR) 100 MG tablet Take 100 mg by mouth daily.    . metoprolol succinate (TOPROL-XL) 50 MG 24 hr tablet TAKE 1 TABLET BY MOUTH DAILY 90 tablet 3  . olmesartan (BENICAR) 40 MG tablet Take by mouth.    . oxyCODONE-acetaminophen (PERCOCET) 10-325 MG tablet Take 1 tablet by mouth every 4 (four) hours as needed for pain.     No current facility-administered medications for this visit.     Physical Exam  Height 5\' 6"  (1.676 m), weight 240 lb (108.9 kg).  Constitutional: overall normal hygiene, normal nutrition, well developed, normal grooming, normal body habitus. Assistive device:none  Musculoskeletal: gait and station Limp right, muscle tone and strength are normal, no tremors or atrophy is present.  .  Neurological: coordination overall normal.  Deep tendon reflex/nerve stretch intact.  Sensation normal.  Cranial nerves II-XII intact.   Skin:   Normal overall no scars, lesions, ulcers or rashes. No psoriasis.  Psychiatric: Alert and oriented x 3.  Recent memory intact, remote memory unclear.  Normal mood and affect. Well groomed.  Good eye contact.  Cardiovascular: overall no swelling, no varicosities, no edema bilaterally, normal temperatures of the legs and arms, no clubbing, cyanosis and good capillary refill.  Lymphatic: palpation is normal.  Right knee has small effusion, crepitus, ROM 0 to 110, limp right, pain medially.  No distal edema.  All other systems reviewed and are negative   The patient has been educated about the nature of the problem(s) and counseled on treatment options.  The patient appeared to understand what I have discussed and is in agreement with it.  Encounter Diagnosis  Name Primary?  . Chronic  pain of right knee Yes    PLAN Call if any problems.  Precautions discussed.  I will stop his Naprosyn and begin diclofenac bid.  He should ice the knee before bed.  Return to clinic 1 month   Electronically Signed Sanjuana Kava, MD 4/13/20229:39 AM

## 2020-08-09 DIAGNOSIS — Z6837 Body mass index (BMI) 37.0-37.9, adult: Secondary | ICD-10-CM | POA: Diagnosis not present

## 2020-08-09 DIAGNOSIS — I10 Essential (primary) hypertension: Secondary | ICD-10-CM | POA: Diagnosis not present

## 2020-08-09 DIAGNOSIS — E7849 Other hyperlipidemia: Secondary | ICD-10-CM | POA: Diagnosis not present

## 2020-08-21 ENCOUNTER — Encounter: Payer: Self-pay | Admitting: Orthopaedic Surgery

## 2020-08-21 ENCOUNTER — Ambulatory Visit (INDEPENDENT_AMBULATORY_CARE_PROVIDER_SITE_OTHER): Payer: Medicare Other | Admitting: Orthopaedic Surgery

## 2020-08-21 ENCOUNTER — Other Ambulatory Visit: Payer: Self-pay

## 2020-08-21 VITALS — BP 151/95 | HR 66 | Ht 66.0 in | Wt 237.0 lb

## 2020-08-21 DIAGNOSIS — G8929 Other chronic pain: Secondary | ICD-10-CM

## 2020-08-21 DIAGNOSIS — M25561 Pain in right knee: Secondary | ICD-10-CM | POA: Diagnosis not present

## 2020-08-21 NOTE — Progress Notes (Signed)
I am much better.  He changed to the diclofenac and has had very good results with the pain in the right knee.  He still has some pain at times but much, much less.  He has no new trauma.  He is walking well. ROM is full.  NV intact.  Encounter Diagnosis  Name Primary?  . Chronic pain of right knee Yes   He can start coming off the diclofenac.  I will see him as needed.  Call if any problem.  Precautions discussed.   Electronically Signed Sanjuana Kava, MD 5/11/20229:38 AM

## 2020-09-06 DIAGNOSIS — K5909 Other constipation: Secondary | ICD-10-CM | POA: Diagnosis not present

## 2020-09-06 DIAGNOSIS — Z6837 Body mass index (BMI) 37.0-37.9, adult: Secondary | ICD-10-CM | POA: Diagnosis not present

## 2020-09-06 DIAGNOSIS — G894 Chronic pain syndrome: Secondary | ICD-10-CM | POA: Diagnosis not present

## 2020-10-04 DIAGNOSIS — I10 Essential (primary) hypertension: Secondary | ICD-10-CM | POA: Diagnosis not present

## 2020-10-04 DIAGNOSIS — E6609 Other obesity due to excess calories: Secondary | ICD-10-CM | POA: Diagnosis not present

## 2020-10-04 DIAGNOSIS — M5136 Other intervertebral disc degeneration, lumbar region: Secondary | ICD-10-CM | POA: Diagnosis not present

## 2020-10-04 DIAGNOSIS — Z6836 Body mass index (BMI) 36.0-36.9, adult: Secondary | ICD-10-CM | POA: Diagnosis not present

## 2020-10-17 DIAGNOSIS — R9439 Abnormal result of other cardiovascular function study: Secondary | ICD-10-CM | POA: Diagnosis not present

## 2020-10-17 DIAGNOSIS — I480 Paroxysmal atrial fibrillation: Secondary | ICD-10-CM | POA: Diagnosis not present

## 2020-10-17 DIAGNOSIS — E785 Hyperlipidemia, unspecified: Secondary | ICD-10-CM | POA: Diagnosis not present

## 2020-10-17 DIAGNOSIS — I10 Essential (primary) hypertension: Secondary | ICD-10-CM | POA: Diagnosis not present

## 2020-10-25 DIAGNOSIS — M5136 Other intervertebral disc degeneration, lumbar region: Secondary | ICD-10-CM | POA: Diagnosis not present

## 2020-10-25 DIAGNOSIS — Z6837 Body mass index (BMI) 37.0-37.9, adult: Secondary | ICD-10-CM | POA: Diagnosis not present

## 2020-10-25 DIAGNOSIS — R42 Dizziness and giddiness: Secondary | ICD-10-CM | POA: Diagnosis not present

## 2020-11-13 ENCOUNTER — Ambulatory Visit (INDEPENDENT_AMBULATORY_CARE_PROVIDER_SITE_OTHER): Payer: Medicare Other | Admitting: Orthopaedic Surgery

## 2020-11-13 ENCOUNTER — Other Ambulatory Visit: Payer: Self-pay

## 2020-11-13 ENCOUNTER — Encounter: Payer: Self-pay | Admitting: Orthopaedic Surgery

## 2020-11-13 VITALS — BP 150/95 | HR 66 | Ht 66.0 in | Wt 240.1 lb

## 2020-11-13 DIAGNOSIS — M25561 Pain in right knee: Secondary | ICD-10-CM | POA: Diagnosis not present

## 2020-11-13 DIAGNOSIS — G8929 Other chronic pain: Secondary | ICD-10-CM

## 2020-11-13 NOTE — Progress Notes (Signed)
PROCEDURE NOTE:  The patient requests injections of the right knee , verbal consent was obtained.  The right knee was prepped appropriately after time out was performed.   Sterile technique was observed and injection of 1 cc of Celestone '6mg'$  with several cc's of plain xylocaine. Anesthesia was provided by ethyl chloride and a 20-gauge needle was used to inject the knee area. The injection was tolerated well.  A band aid dressing was applied.  The patient was advised to apply ice later today and tomorrow to the injection sight as needed.   Continue the diclofenac.  I will see as needed.  Call if any problem.  Precautions discussed.  Electronically Signed Sanjuana Kava, MD 8/3/20229:04 AM

## 2020-11-22 DIAGNOSIS — M5136 Other intervertebral disc degeneration, lumbar region: Secondary | ICD-10-CM | POA: Diagnosis not present

## 2020-11-22 DIAGNOSIS — N529 Male erectile dysfunction, unspecified: Secondary | ICD-10-CM | POA: Diagnosis not present

## 2020-11-22 DIAGNOSIS — Z6837 Body mass index (BMI) 37.0-37.9, adult: Secondary | ICD-10-CM | POA: Diagnosis not present

## 2020-12-11 ENCOUNTER — Other Ambulatory Visit: Payer: Self-pay

## 2020-12-11 ENCOUNTER — Ambulatory Visit (INDEPENDENT_AMBULATORY_CARE_PROVIDER_SITE_OTHER): Payer: Medicare Other | Admitting: Orthopaedic Surgery

## 2020-12-11 ENCOUNTER — Encounter: Payer: Self-pay | Admitting: Orthopaedic Surgery

## 2020-12-11 DIAGNOSIS — M25561 Pain in right knee: Secondary | ICD-10-CM | POA: Diagnosis not present

## 2020-12-11 DIAGNOSIS — G8929 Other chronic pain: Secondary | ICD-10-CM

## 2020-12-11 MED ORDER — PREDNISONE 10 MG (21) PO TBPK: ORAL_TABLET | ORAL | 1 refills | Status: DC

## 2020-12-11 NOTE — Progress Notes (Signed)
My knee is hurting.  His right knee is very painful.  He has more swelling and popping.  He has no giving way.  It awakens him at night.  He has no new trauma.  The injection last time helped.  He is on diclofenac.  MRI in January of this year showed no meniscus tear and thinning of articular cartilage.    Right knee has effusion, crepitus, ROM 0 to 110, stable, limp right, NV intact.  No distal edema.  Encounter Diagnosis  Name Primary?   Chronic pain of right knee Yes   I will give prednisone dose pack.  Stop the diclofenac when on the dose pack.  Return in two weeks.  Call if any problem.  Precautions discussed.  Electronically Signed Sanjuana Kava, MD 8/31/20229:51 AM

## 2020-12-24 ENCOUNTER — Telehealth: Payer: Self-pay | Admitting: Orthopaedic Surgery

## 2020-12-25 ENCOUNTER — Encounter: Payer: Self-pay | Admitting: Orthopaedic Surgery

## 2020-12-25 ENCOUNTER — Ambulatory Visit (INDEPENDENT_AMBULATORY_CARE_PROVIDER_SITE_OTHER): Payer: Medicare Other | Admitting: Orthopaedic Surgery

## 2020-12-25 ENCOUNTER — Other Ambulatory Visit: Payer: Self-pay

## 2020-12-25 VITALS — Ht 66.0 in | Wt 238.5 lb

## 2020-12-25 DIAGNOSIS — M25561 Pain in right knee: Secondary | ICD-10-CM

## 2020-12-25 DIAGNOSIS — G8929 Other chronic pain: Secondary | ICD-10-CM

## 2020-12-25 NOTE — Progress Notes (Signed)
My knee is a little better today.  He took the prednisone dose pack and had more swelling of the right knee.  Then he began the diclofenac and his knee got much better.  He has less pain today and very little swelling.  He has no pain in GI tract  He has no new trauma.  The right knee has just slight effusion, crepitus and ROM 0 to 110, no limp, stable, NV intact.  Encounter Diagnosis  Name Primary?   Chronic pain of right knee Yes   Continue the diclofenac.  Return in one month.  Call if any problem.  Precautions discussed.  Electronically Signed Sanjuana Kava, MD 9/14/20229:34 AM

## 2021-01-17 DIAGNOSIS — E782 Mixed hyperlipidemia: Secondary | ICD-10-CM | POA: Diagnosis not present

## 2021-01-17 DIAGNOSIS — Z6837 Body mass index (BMI) 37.0-37.9, adult: Secondary | ICD-10-CM | POA: Diagnosis not present

## 2021-01-17 DIAGNOSIS — Z23 Encounter for immunization: Secondary | ICD-10-CM | POA: Diagnosis not present

## 2021-01-22 ENCOUNTER — Ambulatory Visit (INDEPENDENT_AMBULATORY_CARE_PROVIDER_SITE_OTHER): Payer: Medicare Other | Admitting: Orthopaedic Surgery

## 2021-01-22 ENCOUNTER — Encounter: Payer: Self-pay | Admitting: Orthopaedic Surgery

## 2021-01-22 VITALS — Ht 66.0 in | Wt 239.2 lb

## 2021-01-22 DIAGNOSIS — M25561 Pain in right knee: Secondary | ICD-10-CM | POA: Diagnosis not present

## 2021-01-22 DIAGNOSIS — G8929 Other chronic pain: Secondary | ICD-10-CM

## 2021-01-22 NOTE — Progress Notes (Signed)
PROCEDURE NOTE:  The patient requests injections of the right knee , verbal consent was obtained.  The right knee was prepped appropriately after time out was performed.   Sterile technique was observed and injection of 1 cc of Celestone 6mg  with several cc's of plain xylocaine. Anesthesia was provided by ethyl chloride and a 20-gauge needle was used to inject the knee area. The injection was tolerated well.  A band aid dressing was applied.  The patient was advised to apply ice later today and tomorrow to the injection sight as needed.   Return in six weeks.  Call if any problem.  Precautions discussed.  Electronically Signed Sanjuana Kava, MD 10/12/20229:41 AM

## 2021-02-20 DIAGNOSIS — E7849 Other hyperlipidemia: Secondary | ICD-10-CM | POA: Diagnosis not present

## 2021-02-20 DIAGNOSIS — Z6837 Body mass index (BMI) 37.0-37.9, adult: Secondary | ICD-10-CM | POA: Diagnosis not present

## 2021-02-20 DIAGNOSIS — M5136 Other intervertebral disc degeneration, lumbar region: Secondary | ICD-10-CM | POA: Diagnosis not present

## 2021-02-20 DIAGNOSIS — R42 Dizziness and giddiness: Secondary | ICD-10-CM | POA: Diagnosis not present

## 2021-03-05 ENCOUNTER — Encounter: Payer: Self-pay | Admitting: Orthopaedic Surgery

## 2021-03-05 ENCOUNTER — Ambulatory Visit (INDEPENDENT_AMBULATORY_CARE_PROVIDER_SITE_OTHER): Payer: Medicare Other | Admitting: Orthopaedic Surgery

## 2021-03-05 DIAGNOSIS — M25561 Pain in right knee: Secondary | ICD-10-CM | POA: Diagnosis not present

## 2021-03-05 DIAGNOSIS — G8929 Other chronic pain: Secondary | ICD-10-CM

## 2021-03-05 NOTE — Progress Notes (Signed)
PROCEDURE NOTE:  The patient requests injections of the right knee , verbal consent was obtained.  The right knee was prepped appropriately after time out was performed.   Sterile technique was observed and injection of 1 cc of DepoMedrol 40mg  with several cc's of plain xylocaine. Anesthesia was provided by ethyl chloride and a 20-gauge needle was used to inject the knee area. The injection was tolerated well.  A band aid dressing was applied.  The patient was advised to apply ice later today and tomorrow to the injection sight as needed.   Encounter Diagnosis  Name Primary?   Chronic pain of right knee Yes   Return prn.  Call if any problem.  Precautions discussed.  Electronically Signed Sanjuana Kava, MD 11/23/20229:30 AM

## 2021-03-21 DIAGNOSIS — R3589 Other polyuria: Secondary | ICD-10-CM | POA: Diagnosis not present

## 2021-03-21 DIAGNOSIS — R109 Unspecified abdominal pain: Secondary | ICD-10-CM | POA: Diagnosis not present

## 2021-03-21 DIAGNOSIS — F1120 Opioid dependence, uncomplicated: Secondary | ICD-10-CM | POA: Diagnosis not present

## 2021-03-21 DIAGNOSIS — Z6838 Body mass index (BMI) 38.0-38.9, adult: Secondary | ICD-10-CM | POA: Diagnosis not present

## 2021-04-15 DIAGNOSIS — I2510 Atherosclerotic heart disease of native coronary artery without angina pectoris: Secondary | ICD-10-CM | POA: Diagnosis not present

## 2021-04-15 DIAGNOSIS — E785 Hyperlipidemia, unspecified: Secondary | ICD-10-CM | POA: Diagnosis not present

## 2021-04-15 DIAGNOSIS — I10 Essential (primary) hypertension: Secondary | ICD-10-CM | POA: Diagnosis not present

## 2021-04-15 DIAGNOSIS — I480 Paroxysmal atrial fibrillation: Secondary | ICD-10-CM | POA: Diagnosis not present

## 2021-04-24 DIAGNOSIS — E669 Obesity, unspecified: Secondary | ICD-10-CM | POA: Diagnosis not present

## 2021-04-24 DIAGNOSIS — I4891 Unspecified atrial fibrillation: Secondary | ICD-10-CM | POA: Diagnosis not present

## 2021-04-24 DIAGNOSIS — Z6838 Body mass index (BMI) 38.0-38.9, adult: Secondary | ICD-10-CM | POA: Diagnosis not present

## 2021-04-24 DIAGNOSIS — G894 Chronic pain syndrome: Secondary | ICD-10-CM | POA: Diagnosis not present

## 2021-04-30 ENCOUNTER — Other Ambulatory Visit: Payer: Self-pay

## 2021-04-30 ENCOUNTER — Ambulatory Visit (INDEPENDENT_AMBULATORY_CARE_PROVIDER_SITE_OTHER): Payer: Medicare Other | Admitting: Orthopaedic Surgery

## 2021-04-30 ENCOUNTER — Encounter: Payer: Self-pay | Admitting: Orthopaedic Surgery

## 2021-04-30 DIAGNOSIS — G8929 Other chronic pain: Secondary | ICD-10-CM

## 2021-04-30 DIAGNOSIS — M25561 Pain in right knee: Secondary | ICD-10-CM | POA: Diagnosis not present

## 2021-04-30 NOTE — Progress Notes (Signed)
PROCEDURE NOTE:  The patient requests injections of the right knee , verbal consent was obtained.  The right knee was prepped appropriately after time out was performed.   Sterile technique was observed and injection of 1 cc of DepoMedrol 40mg  with several cc's of plain xylocaine. Anesthesia was provided by ethyl chloride and a 20-gauge needle was used to inject the knee area. The injection was tolerated well.  A band aid dressing was applied.  The patient was advised to apply ice later today and tomorrow to the injection sight as needed.  Encounter Diagnosis  Name Primary?   Chronic pain of right knee Yes   I will see as needed.  Call if any problem.  Precautions discussed.  Electronically Signed Sanjuana Kava, MD 1/18/20239:31 AM

## 2021-05-22 DIAGNOSIS — M1611 Unilateral primary osteoarthritis, right hip: Secondary | ICD-10-CM | POA: Diagnosis not present

## 2021-05-22 DIAGNOSIS — I4891 Unspecified atrial fibrillation: Secondary | ICD-10-CM | POA: Diagnosis not present

## 2021-05-22 DIAGNOSIS — M1711 Unilateral primary osteoarthritis, right knee: Secondary | ICD-10-CM | POA: Diagnosis not present

## 2021-05-22 DIAGNOSIS — Z6838 Body mass index (BMI) 38.0-38.9, adult: Secondary | ICD-10-CM | POA: Diagnosis not present

## 2021-06-02 DIAGNOSIS — Z20822 Contact with and (suspected) exposure to covid-19: Secondary | ICD-10-CM | POA: Diagnosis not present

## 2021-06-19 DIAGNOSIS — G894 Chronic pain syndrome: Secondary | ICD-10-CM | POA: Diagnosis not present

## 2021-06-19 DIAGNOSIS — M1711 Unilateral primary osteoarthritis, right knee: Secondary | ICD-10-CM | POA: Diagnosis not present

## 2021-06-19 DIAGNOSIS — F1120 Opioid dependence, uncomplicated: Secondary | ICD-10-CM | POA: Diagnosis not present

## 2021-06-19 DIAGNOSIS — Z6838 Body mass index (BMI) 38.0-38.9, adult: Secondary | ICD-10-CM | POA: Diagnosis not present

## 2021-06-25 DIAGNOSIS — M25861 Other specified joint disorders, right knee: Secondary | ICD-10-CM | POA: Diagnosis not present

## 2021-06-25 DIAGNOSIS — G8929 Other chronic pain: Secondary | ICD-10-CM | POA: Diagnosis not present

## 2021-06-25 DIAGNOSIS — M2242 Chondromalacia patellae, left knee: Secondary | ICD-10-CM | POA: Diagnosis not present

## 2021-06-25 DIAGNOSIS — M25561 Pain in right knee: Secondary | ICD-10-CM | POA: Diagnosis not present

## 2021-06-25 DIAGNOSIS — M2241 Chondromalacia patellae, right knee: Secondary | ICD-10-CM | POA: Diagnosis not present

## 2021-06-29 ENCOUNTER — Telehealth: Payer: Self-pay | Admitting: Orthopaedic Surgery

## 2021-07-03 DIAGNOSIS — M2241 Chondromalacia patellae, right knee: Secondary | ICD-10-CM | POA: Diagnosis not present

## 2021-07-03 DIAGNOSIS — M2242 Chondromalacia patellae, left knee: Secondary | ICD-10-CM | POA: Diagnosis not present

## 2021-07-03 DIAGNOSIS — G8929 Other chronic pain: Secondary | ICD-10-CM | POA: Diagnosis not present

## 2021-07-03 DIAGNOSIS — M25561 Pain in right knee: Secondary | ICD-10-CM | POA: Diagnosis not present

## 2021-07-07 DIAGNOSIS — M2242 Chondromalacia patellae, left knee: Secondary | ICD-10-CM | POA: Diagnosis not present

## 2021-07-07 DIAGNOSIS — G8929 Other chronic pain: Secondary | ICD-10-CM | POA: Diagnosis not present

## 2021-07-07 DIAGNOSIS — M2241 Chondromalacia patellae, right knee: Secondary | ICD-10-CM | POA: Diagnosis not present

## 2021-07-07 DIAGNOSIS — M25561 Pain in right knee: Secondary | ICD-10-CM | POA: Diagnosis not present

## 2021-07-10 DIAGNOSIS — M2242 Chondromalacia patellae, left knee: Secondary | ICD-10-CM | POA: Diagnosis not present

## 2021-07-10 DIAGNOSIS — M25561 Pain in right knee: Secondary | ICD-10-CM | POA: Diagnosis not present

## 2021-07-10 DIAGNOSIS — G8929 Other chronic pain: Secondary | ICD-10-CM | POA: Diagnosis not present

## 2021-07-10 DIAGNOSIS — M2241 Chondromalacia patellae, right knee: Secondary | ICD-10-CM | POA: Diagnosis not present

## 2021-07-22 DIAGNOSIS — Z6838 Body mass index (BMI) 38.0-38.9, adult: Secondary | ICD-10-CM | POA: Diagnosis not present

## 2021-07-22 DIAGNOSIS — H1131 Conjunctival hemorrhage, right eye: Secondary | ICD-10-CM | POA: Diagnosis not present

## 2021-07-22 DIAGNOSIS — I4891 Unspecified atrial fibrillation: Secondary | ICD-10-CM | POA: Diagnosis not present

## 2021-07-22 DIAGNOSIS — G894 Chronic pain syndrome: Secondary | ICD-10-CM | POA: Diagnosis not present

## 2021-07-30 DIAGNOSIS — G8929 Other chronic pain: Secondary | ICD-10-CM | POA: Diagnosis not present

## 2021-07-30 DIAGNOSIS — R936 Abnormal findings on diagnostic imaging of limbs: Secondary | ICD-10-CM | POA: Diagnosis not present

## 2021-07-30 DIAGNOSIS — M25561 Pain in right knee: Secondary | ICD-10-CM | POA: Diagnosis not present

## 2021-07-30 DIAGNOSIS — M2241 Chondromalacia patellae, right knee: Secondary | ICD-10-CM | POA: Diagnosis not present

## 2021-08-12 DIAGNOSIS — M2241 Chondromalacia patellae, right knee: Secondary | ICD-10-CM | POA: Diagnosis not present

## 2021-08-12 DIAGNOSIS — M7121 Synovial cyst of popliteal space [Baker], right knee: Secondary | ICD-10-CM | POA: Diagnosis not present

## 2021-08-12 DIAGNOSIS — M25561 Pain in right knee: Secondary | ICD-10-CM | POA: Diagnosis not present

## 2021-08-12 DIAGNOSIS — G8929 Other chronic pain: Secondary | ICD-10-CM | POA: Diagnosis not present

## 2021-08-12 DIAGNOSIS — R936 Abnormal findings on diagnostic imaging of limbs: Secondary | ICD-10-CM | POA: Diagnosis not present

## 2021-08-14 DIAGNOSIS — M7121 Synovial cyst of popliteal space [Baker], right knee: Secondary | ICD-10-CM | POA: Diagnosis not present

## 2021-08-21 DIAGNOSIS — R7989 Other specified abnormal findings of blood chemistry: Secondary | ICD-10-CM | POA: Diagnosis not present

## 2021-08-21 DIAGNOSIS — Z1331 Encounter for screening for depression: Secondary | ICD-10-CM | POA: Diagnosis not present

## 2021-08-21 DIAGNOSIS — Z0001 Encounter for general adult medical examination with abnormal findings: Secondary | ICD-10-CM | POA: Diagnosis not present

## 2021-08-21 DIAGNOSIS — N529 Male erectile dysfunction, unspecified: Secondary | ICD-10-CM | POA: Diagnosis not present

## 2021-08-25 DIAGNOSIS — Z0001 Encounter for general adult medical examination with abnormal findings: Secondary | ICD-10-CM | POA: Diagnosis not present

## 2021-08-26 DIAGNOSIS — R42 Dizziness and giddiness: Secondary | ICD-10-CM | POA: Diagnosis not present

## 2021-08-26 DIAGNOSIS — U071 COVID-19: Secondary | ICD-10-CM | POA: Diagnosis not present

## 2021-08-26 DIAGNOSIS — R531 Weakness: Secondary | ICD-10-CM | POA: Diagnosis not present

## 2021-09-05 DIAGNOSIS — M2241 Chondromalacia patellae, right knee: Secondary | ICD-10-CM | POA: Diagnosis not present

## 2021-09-05 DIAGNOSIS — M25561 Pain in right knee: Secondary | ICD-10-CM | POA: Diagnosis not present

## 2021-09-05 DIAGNOSIS — G8929 Other chronic pain: Secondary | ICD-10-CM | POA: Diagnosis not present

## 2021-09-25 DIAGNOSIS — F1120 Opioid dependence, uncomplicated: Secondary | ICD-10-CM | POA: Diagnosis not present

## 2021-09-25 DIAGNOSIS — R42 Dizziness and giddiness: Secondary | ICD-10-CM | POA: Diagnosis not present

## 2021-09-25 DIAGNOSIS — M109 Gout, unspecified: Secondary | ICD-10-CM | POA: Diagnosis not present

## 2021-09-25 DIAGNOSIS — Z6835 Body mass index (BMI) 35.0-35.9, adult: Secondary | ICD-10-CM | POA: Diagnosis not present

## 2021-10-08 DIAGNOSIS — I10 Essential (primary) hypertension: Secondary | ICD-10-CM | POA: Diagnosis not present

## 2021-10-08 DIAGNOSIS — E785 Hyperlipidemia, unspecified: Secondary | ICD-10-CM | POA: Diagnosis not present

## 2021-10-08 DIAGNOSIS — I480 Paroxysmal atrial fibrillation: Secondary | ICD-10-CM | POA: Diagnosis not present

## 2021-10-11 DIAGNOSIS — M19072 Primary osteoarthritis, left ankle and foot: Secondary | ICD-10-CM | POA: Diagnosis not present

## 2021-10-11 DIAGNOSIS — Z96642 Presence of left artificial hip joint: Secondary | ICD-10-CM | POA: Diagnosis not present

## 2021-10-11 DIAGNOSIS — Z7722 Contact with and (suspected) exposure to environmental tobacco smoke (acute) (chronic): Secondary | ICD-10-CM | POA: Diagnosis not present

## 2021-10-11 DIAGNOSIS — M25572 Pain in left ankle and joints of left foot: Secondary | ICD-10-CM | POA: Diagnosis not present

## 2021-10-11 DIAGNOSIS — F1010 Alcohol abuse, uncomplicated: Secondary | ICD-10-CM | POA: Diagnosis not present

## 2021-10-11 DIAGNOSIS — M89541 Osteolysis, right hand: Secondary | ICD-10-CM | POA: Diagnosis not present

## 2021-10-11 DIAGNOSIS — Z79899 Other long term (current) drug therapy: Secondary | ICD-10-CM | POA: Diagnosis not present

## 2021-10-11 DIAGNOSIS — M25511 Pain in right shoulder: Secondary | ICD-10-CM | POA: Diagnosis not present

## 2021-10-11 DIAGNOSIS — M19011 Primary osteoarthritis, right shoulder: Secondary | ICD-10-CM | POA: Diagnosis not present

## 2021-10-11 DIAGNOSIS — M109 Gout, unspecified: Secondary | ICD-10-CM | POA: Diagnosis not present

## 2021-10-11 DIAGNOSIS — Z609 Problem related to social environment, unspecified: Secondary | ICD-10-CM | POA: Diagnosis not present

## 2021-10-11 DIAGNOSIS — Z87891 Personal history of nicotine dependence: Secondary | ICD-10-CM | POA: Diagnosis not present

## 2021-10-11 DIAGNOSIS — Z7901 Long term (current) use of anticoagulants: Secondary | ICD-10-CM | POA: Diagnosis not present

## 2021-10-11 DIAGNOSIS — Z791 Long term (current) use of non-steroidal anti-inflammatories (NSAID): Secondary | ICD-10-CM | POA: Diagnosis not present

## 2021-10-11 DIAGNOSIS — I10 Essential (primary) hypertension: Secondary | ICD-10-CM | POA: Diagnosis not present

## 2021-10-11 DIAGNOSIS — I480 Paroxysmal atrial fibrillation: Secondary | ICD-10-CM | POA: Diagnosis not present

## 2021-10-11 DIAGNOSIS — Z79891 Long term (current) use of opiate analgesic: Secondary | ICD-10-CM | POA: Diagnosis not present

## 2021-10-11 DIAGNOSIS — M86141 Other acute osteomyelitis, right hand: Secondary | ICD-10-CM | POA: Diagnosis not present

## 2021-10-11 DIAGNOSIS — M7989 Other specified soft tissue disorders: Secondary | ICD-10-CM | POA: Diagnosis not present

## 2021-10-11 DIAGNOSIS — N179 Acute kidney failure, unspecified: Secondary | ICD-10-CM | POA: Diagnosis not present

## 2021-10-11 DIAGNOSIS — M79644 Pain in right finger(s): Secondary | ICD-10-CM | POA: Diagnosis not present

## 2021-10-12 DIAGNOSIS — I480 Paroxysmal atrial fibrillation: Secondary | ICD-10-CM | POA: Diagnosis not present

## 2021-10-12 DIAGNOSIS — M25511 Pain in right shoulder: Secondary | ICD-10-CM | POA: Diagnosis not present

## 2021-10-12 DIAGNOSIS — M25572 Pain in left ankle and joints of left foot: Secondary | ICD-10-CM | POA: Diagnosis not present

## 2021-10-12 DIAGNOSIS — M79644 Pain in right finger(s): Secondary | ICD-10-CM | POA: Diagnosis not present

## 2021-10-13 DIAGNOSIS — I480 Paroxysmal atrial fibrillation: Secondary | ICD-10-CM | POA: Diagnosis not present

## 2021-10-13 DIAGNOSIS — M25572 Pain in left ankle and joints of left foot: Secondary | ICD-10-CM | POA: Diagnosis not present

## 2021-10-13 DIAGNOSIS — M25441 Effusion, right hand: Secondary | ICD-10-CM | POA: Diagnosis not present

## 2021-10-13 DIAGNOSIS — M25511 Pain in right shoulder: Secondary | ICD-10-CM | POA: Diagnosis not present

## 2021-10-13 DIAGNOSIS — M79644 Pain in right finger(s): Secondary | ICD-10-CM | POA: Diagnosis not present

## 2021-10-13 DIAGNOSIS — R600 Localized edema: Secondary | ICD-10-CM | POA: Diagnosis not present

## 2021-10-14 DIAGNOSIS — M109 Gout, unspecified: Secondary | ICD-10-CM | POA: Diagnosis not present

## 2021-10-14 DIAGNOSIS — I10 Essential (primary) hypertension: Secondary | ICD-10-CM | POA: Diagnosis not present

## 2021-10-14 DIAGNOSIS — I480 Paroxysmal atrial fibrillation: Secondary | ICD-10-CM | POA: Diagnosis not present

## 2021-10-14 DIAGNOSIS — M79644 Pain in right finger(s): Secondary | ICD-10-CM | POA: Diagnosis not present

## 2021-10-20 DIAGNOSIS — M94262 Chondromalacia, left knee: Secondary | ICD-10-CM | POA: Diagnosis not present

## 2021-10-20 DIAGNOSIS — G8929 Other chronic pain: Secondary | ICD-10-CM | POA: Diagnosis not present

## 2021-10-20 DIAGNOSIS — M25561 Pain in right knee: Secondary | ICD-10-CM | POA: Diagnosis not present

## 2021-10-20 DIAGNOSIS — M1711 Unilateral primary osteoarthritis, right knee: Secondary | ICD-10-CM | POA: Diagnosis not present

## 2021-10-21 DIAGNOSIS — I480 Paroxysmal atrial fibrillation: Secondary | ICD-10-CM | POA: Diagnosis not present

## 2021-10-22 DIAGNOSIS — I495 Sick sinus syndrome: Secondary | ICD-10-CM | POA: Diagnosis not present

## 2021-10-22 DIAGNOSIS — I4819 Other persistent atrial fibrillation: Secondary | ICD-10-CM | POA: Diagnosis not present

## 2021-10-23 DIAGNOSIS — Z6836 Body mass index (BMI) 36.0-36.9, adult: Secondary | ICD-10-CM | POA: Diagnosis not present

## 2021-10-23 DIAGNOSIS — M86241 Subacute osteomyelitis, right hand: Secondary | ICD-10-CM | POA: Diagnosis not present

## 2021-10-23 DIAGNOSIS — M1611 Unilateral primary osteoarthritis, right hip: Secondary | ICD-10-CM | POA: Diagnosis not present

## 2021-10-23 DIAGNOSIS — M109 Gout, unspecified: Secondary | ICD-10-CM | POA: Diagnosis not present

## 2021-10-24 DIAGNOSIS — M79644 Pain in right finger(s): Secondary | ICD-10-CM | POA: Diagnosis not present

## 2021-10-24 DIAGNOSIS — I10 Essential (primary) hypertension: Secondary | ICD-10-CM | POA: Diagnosis not present

## 2021-10-24 DIAGNOSIS — I480 Paroxysmal atrial fibrillation: Secondary | ICD-10-CM | POA: Diagnosis not present

## 2021-11-04 DIAGNOSIS — M25472 Effusion, left ankle: Secondary | ICD-10-CM | POA: Diagnosis not present

## 2021-11-05 DIAGNOSIS — M109 Gout, unspecified: Secondary | ICD-10-CM | POA: Insufficient documentation

## 2021-11-06 ENCOUNTER — Other Ambulatory Visit (HOSPITAL_COMMUNITY): Payer: Self-pay | Admitting: Nephrology

## 2021-11-06 ENCOUNTER — Other Ambulatory Visit: Payer: Self-pay | Admitting: Nephrology

## 2021-11-06 DIAGNOSIS — I129 Hypertensive chronic kidney disease with stage 1 through stage 4 chronic kidney disease, or unspecified chronic kidney disease: Secondary | ICD-10-CM

## 2021-11-06 DIAGNOSIS — N1831 Chronic kidney disease, stage 3a: Secondary | ICD-10-CM | POA: Diagnosis not present

## 2021-11-06 DIAGNOSIS — Z5181 Encounter for therapeutic drug level monitoring: Secondary | ICD-10-CM | POA: Diagnosis not present

## 2021-11-06 DIAGNOSIS — I5032 Chronic diastolic (congestive) heart failure: Secondary | ICD-10-CM | POA: Diagnosis not present

## 2021-11-12 DIAGNOSIS — Z6838 Body mass index (BMI) 38.0-38.9, adult: Secondary | ICD-10-CM | POA: Diagnosis not present

## 2021-11-12 DIAGNOSIS — I5032 Chronic diastolic (congestive) heart failure: Secondary | ICD-10-CM | POA: Diagnosis not present

## 2021-11-12 DIAGNOSIS — I480 Paroxysmal atrial fibrillation: Secondary | ICD-10-CM | POA: Diagnosis not present

## 2021-11-12 DIAGNOSIS — E875 Hyperkalemia: Secondary | ICD-10-CM | POA: Diagnosis not present

## 2021-11-12 DIAGNOSIS — N1831 Chronic kidney disease, stage 3a: Secondary | ICD-10-CM | POA: Diagnosis not present

## 2021-11-12 DIAGNOSIS — I129 Hypertensive chronic kidney disease with stage 1 through stage 4 chronic kidney disease, or unspecified chronic kidney disease: Secondary | ICD-10-CM | POA: Diagnosis not present

## 2021-11-21 DIAGNOSIS — F1120 Opioid dependence, uncomplicated: Secondary | ICD-10-CM | POA: Diagnosis not present

## 2021-11-21 DIAGNOSIS — M1711 Unilateral primary osteoarthritis, right knee: Secondary | ICD-10-CM | POA: Diagnosis not present

## 2021-11-21 DIAGNOSIS — I10 Essential (primary) hypertension: Secondary | ICD-10-CM | POA: Diagnosis not present

## 2021-11-21 DIAGNOSIS — Z6836 Body mass index (BMI) 36.0-36.9, adult: Secondary | ICD-10-CM | POA: Diagnosis not present

## 2021-11-26 DIAGNOSIS — M1711 Unilateral primary osteoarthritis, right knee: Secondary | ICD-10-CM | POA: Diagnosis not present

## 2021-12-10 DIAGNOSIS — D638 Anemia in other chronic diseases classified elsewhere: Secondary | ICD-10-CM | POA: Diagnosis not present

## 2021-12-10 DIAGNOSIS — I5032 Chronic diastolic (congestive) heart failure: Secondary | ICD-10-CM | POA: Diagnosis not present

## 2021-12-10 DIAGNOSIS — N1831 Chronic kidney disease, stage 3a: Secondary | ICD-10-CM | POA: Diagnosis not present

## 2021-12-10 DIAGNOSIS — I129 Hypertensive chronic kidney disease with stage 1 through stage 4 chronic kidney disease, or unspecified chronic kidney disease: Secondary | ICD-10-CM | POA: Diagnosis not present

## 2021-12-19 DIAGNOSIS — G894 Chronic pain syndrome: Secondary | ICD-10-CM | POA: Diagnosis not present

## 2021-12-19 DIAGNOSIS — E6609 Other obesity due to excess calories: Secondary | ICD-10-CM | POA: Diagnosis not present

## 2021-12-19 DIAGNOSIS — Z6834 Body mass index (BMI) 34.0-34.9, adult: Secondary | ICD-10-CM | POA: Diagnosis not present

## 2021-12-19 DIAGNOSIS — E7849 Other hyperlipidemia: Secondary | ICD-10-CM | POA: Diagnosis not present

## 2021-12-19 DIAGNOSIS — R42 Dizziness and giddiness: Secondary | ICD-10-CM | POA: Diagnosis not present

## 2021-12-23 DIAGNOSIS — M1711 Unilateral primary osteoarthritis, right knee: Secondary | ICD-10-CM | POA: Insufficient documentation

## 2021-12-23 DIAGNOSIS — G8929 Other chronic pain: Secondary | ICD-10-CM | POA: Diagnosis not present

## 2021-12-23 DIAGNOSIS — M25561 Pain in right knee: Secondary | ICD-10-CM | POA: Diagnosis not present

## 2022-01-14 DIAGNOSIS — E211 Secondary hyperparathyroidism, not elsewhere classified: Secondary | ICD-10-CM | POA: Diagnosis not present

## 2022-01-14 DIAGNOSIS — I5032 Chronic diastolic (congestive) heart failure: Secondary | ICD-10-CM | POA: Diagnosis not present

## 2022-01-14 DIAGNOSIS — E559 Vitamin D deficiency, unspecified: Secondary | ICD-10-CM | POA: Diagnosis not present

## 2022-01-14 DIAGNOSIS — N1831 Chronic kidney disease, stage 3a: Secondary | ICD-10-CM | POA: Diagnosis not present

## 2022-01-15 DIAGNOSIS — I5032 Chronic diastolic (congestive) heart failure: Secondary | ICD-10-CM | POA: Diagnosis not present

## 2022-01-15 DIAGNOSIS — D638 Anemia in other chronic diseases classified elsewhere: Secondary | ICD-10-CM | POA: Diagnosis not present

## 2022-01-15 DIAGNOSIS — I129 Hypertensive chronic kidney disease with stage 1 through stage 4 chronic kidney disease, or unspecified chronic kidney disease: Secondary | ICD-10-CM | POA: Diagnosis not present

## 2022-01-15 DIAGNOSIS — N1831 Chronic kidney disease, stage 3a: Secondary | ICD-10-CM | POA: Diagnosis not present

## 2022-01-16 DIAGNOSIS — I4891 Unspecified atrial fibrillation: Secondary | ICD-10-CM | POA: Diagnosis not present

## 2022-01-16 DIAGNOSIS — G894 Chronic pain syndrome: Secondary | ICD-10-CM | POA: Diagnosis not present

## 2022-01-16 DIAGNOSIS — Z6835 Body mass index (BMI) 35.0-35.9, adult: Secondary | ICD-10-CM | POA: Diagnosis not present

## 2022-01-16 DIAGNOSIS — F1120 Opioid dependence, uncomplicated: Secondary | ICD-10-CM | POA: Diagnosis not present

## 2022-01-17 DIAGNOSIS — R55 Syncope and collapse: Secondary | ICD-10-CM | POA: Diagnosis not present

## 2022-01-17 DIAGNOSIS — W1839XA Other fall on same level, initial encounter: Secondary | ICD-10-CM | POA: Diagnosis not present

## 2022-01-17 DIAGNOSIS — I4891 Unspecified atrial fibrillation: Secondary | ICD-10-CM | POA: Diagnosis not present

## 2022-01-17 DIAGNOSIS — R7989 Other specified abnormal findings of blood chemistry: Secondary | ICD-10-CM | POA: Diagnosis not present

## 2022-01-17 DIAGNOSIS — M25571 Pain in right ankle and joints of right foot: Secondary | ICD-10-CM | POA: Diagnosis not present

## 2022-01-17 DIAGNOSIS — Z87891 Personal history of nicotine dependence: Secondary | ICD-10-CM | POA: Diagnosis not present

## 2022-01-17 DIAGNOSIS — I4811 Longstanding persistent atrial fibrillation: Secondary | ICD-10-CM | POA: Diagnosis not present

## 2022-01-17 DIAGNOSIS — I10 Essential (primary) hypertension: Secondary | ICD-10-CM | POA: Diagnosis not present

## 2022-01-22 DIAGNOSIS — M1711 Unilateral primary osteoarthritis, right knee: Secondary | ICD-10-CM | POA: Diagnosis not present

## 2022-01-22 DIAGNOSIS — G894 Chronic pain syndrome: Secondary | ICD-10-CM | POA: Diagnosis not present

## 2022-01-22 DIAGNOSIS — I2510 Atherosclerotic heart disease of native coronary artery without angina pectoris: Secondary | ICD-10-CM | POA: Diagnosis not present

## 2022-01-22 DIAGNOSIS — I4891 Unspecified atrial fibrillation: Secondary | ICD-10-CM | POA: Diagnosis not present

## 2022-01-24 DIAGNOSIS — W19XXXA Unspecified fall, initial encounter: Secondary | ICD-10-CM | POA: Diagnosis not present

## 2022-01-24 DIAGNOSIS — M79671 Pain in right foot: Secondary | ICD-10-CM | POA: Diagnosis not present

## 2022-01-24 DIAGNOSIS — M109 Gout, unspecified: Secondary | ICD-10-CM | POA: Diagnosis not present

## 2022-01-24 DIAGNOSIS — M7989 Other specified soft tissue disorders: Secondary | ICD-10-CM | POA: Diagnosis not present

## 2022-01-24 DIAGNOSIS — Z79899 Other long term (current) drug therapy: Secondary | ICD-10-CM | POA: Diagnosis not present

## 2022-01-24 DIAGNOSIS — I4811 Longstanding persistent atrial fibrillation: Secondary | ICD-10-CM | POA: Diagnosis not present

## 2022-01-24 DIAGNOSIS — Z87891 Personal history of nicotine dependence: Secondary | ICD-10-CM | POA: Diagnosis not present

## 2022-01-24 DIAGNOSIS — Z7722 Contact with and (suspected) exposure to environmental tobacco smoke (acute) (chronic): Secondary | ICD-10-CM | POA: Diagnosis not present

## 2022-01-24 DIAGNOSIS — I4819 Other persistent atrial fibrillation: Secondary | ICD-10-CM | POA: Diagnosis not present

## 2022-01-24 DIAGNOSIS — I10 Essential (primary) hypertension: Secondary | ICD-10-CM | POA: Diagnosis not present

## 2022-01-24 DIAGNOSIS — R079 Chest pain, unspecified: Secondary | ICD-10-CM | POA: Diagnosis not present

## 2022-02-11 DIAGNOSIS — I4819 Other persistent atrial fibrillation: Secondary | ICD-10-CM | POA: Diagnosis not present

## 2022-02-11 DIAGNOSIS — E785 Hyperlipidemia, unspecified: Secondary | ICD-10-CM | POA: Diagnosis not present

## 2022-02-11 DIAGNOSIS — I10 Essential (primary) hypertension: Secondary | ICD-10-CM | POA: Diagnosis not present

## 2022-02-13 DIAGNOSIS — Z7901 Long term (current) use of anticoagulants: Secondary | ICD-10-CM | POA: Diagnosis not present

## 2022-02-13 DIAGNOSIS — I4891 Unspecified atrial fibrillation: Secondary | ICD-10-CM | POA: Diagnosis not present

## 2022-02-13 DIAGNOSIS — M1711 Unilateral primary osteoarthritis, right knee: Secondary | ICD-10-CM | POA: Diagnosis not present

## 2022-02-13 DIAGNOSIS — Z6835 Body mass index (BMI) 35.0-35.9, adult: Secondary | ICD-10-CM | POA: Diagnosis not present

## 2022-02-16 DIAGNOSIS — M1711 Unilateral primary osteoarthritis, right knee: Secondary | ICD-10-CM | POA: Diagnosis not present

## 2022-02-16 DIAGNOSIS — G8929 Other chronic pain: Secondary | ICD-10-CM | POA: Diagnosis not present

## 2022-02-16 DIAGNOSIS — M25561 Pain in right knee: Secondary | ICD-10-CM | POA: Diagnosis not present

## 2022-02-17 ENCOUNTER — Encounter: Payer: Self-pay | Admitting: Orthopaedic Surgery

## 2022-02-18 ENCOUNTER — Encounter: Payer: Medicare Other | Admitting: Orthopedic Surgery

## 2022-02-20 DIAGNOSIS — M7051 Other bursitis of knee, right knee: Secondary | ICD-10-CM | POA: Diagnosis not present

## 2022-02-20 DIAGNOSIS — R936 Abnormal findings on diagnostic imaging of limbs: Secondary | ICD-10-CM | POA: Diagnosis not present

## 2022-02-20 DIAGNOSIS — M948X6 Other specified disorders of cartilage, lower leg: Secondary | ICD-10-CM | POA: Diagnosis not present

## 2022-02-20 DIAGNOSIS — M67961 Unspecified disorder of synovium and tendon, right lower leg: Secondary | ICD-10-CM | POA: Diagnosis not present

## 2022-02-25 DIAGNOSIS — M2559 Pain in other specified joint: Secondary | ICD-10-CM | POA: Diagnosis not present

## 2022-02-25 DIAGNOSIS — M25561 Pain in right knee: Secondary | ICD-10-CM | POA: Diagnosis not present

## 2022-02-25 DIAGNOSIS — M2391 Unspecified internal derangement of right knee: Secondary | ICD-10-CM | POA: Diagnosis not present

## 2022-02-25 DIAGNOSIS — M2241 Chondromalacia patellae, right knee: Secondary | ICD-10-CM | POA: Diagnosis not present

## 2022-02-26 DIAGNOSIS — Z01818 Encounter for other preprocedural examination: Secondary | ICD-10-CM | POA: Diagnosis not present

## 2022-02-26 DIAGNOSIS — M2559 Pain in other specified joint: Secondary | ICD-10-CM | POA: Diagnosis not present

## 2022-03-02 DIAGNOSIS — I425 Other restrictive cardiomyopathy: Secondary | ICD-10-CM | POA: Diagnosis not present

## 2022-03-02 DIAGNOSIS — I4819 Other persistent atrial fibrillation: Secondary | ICD-10-CM | POA: Diagnosis not present

## 2022-03-07 DIAGNOSIS — I4819 Other persistent atrial fibrillation: Secondary | ICD-10-CM | POA: Diagnosis not present

## 2022-03-19 DIAGNOSIS — N529 Male erectile dysfunction, unspecified: Secondary | ICD-10-CM | POA: Diagnosis not present

## 2022-03-19 DIAGNOSIS — N492 Inflammatory disorders of scrotum: Secondary | ICD-10-CM | POA: Diagnosis not present

## 2022-03-19 DIAGNOSIS — Z6834 Body mass index (BMI) 34.0-34.9, adult: Secondary | ICD-10-CM | POA: Diagnosis not present

## 2022-03-19 DIAGNOSIS — E782 Mixed hyperlipidemia: Secondary | ICD-10-CM | POA: Diagnosis not present

## 2022-04-17 DIAGNOSIS — E6609 Other obesity due to excess calories: Secondary | ICD-10-CM | POA: Diagnosis not present

## 2022-04-17 DIAGNOSIS — I4891 Unspecified atrial fibrillation: Secondary | ICD-10-CM | POA: Diagnosis not present

## 2022-04-17 DIAGNOSIS — Z6835 Body mass index (BMI) 35.0-35.9, adult: Secondary | ICD-10-CM | POA: Diagnosis not present

## 2022-04-17 DIAGNOSIS — G894 Chronic pain syndrome: Secondary | ICD-10-CM | POA: Diagnosis not present

## 2022-04-17 DIAGNOSIS — N492 Inflammatory disorders of scrotum: Secondary | ICD-10-CM | POA: Diagnosis not present

## 2022-04-17 DIAGNOSIS — M1611 Unilateral primary osteoarthritis, right hip: Secondary | ICD-10-CM | POA: Diagnosis not present

## 2022-04-17 DIAGNOSIS — M1711 Unilateral primary osteoarthritis, right knee: Secondary | ICD-10-CM | POA: Diagnosis not present

## 2022-04-21 ENCOUNTER — Other Ambulatory Visit (HOSPITAL_COMMUNITY): Payer: Self-pay | Admitting: Family Medicine

## 2022-04-21 DIAGNOSIS — N492 Inflammatory disorders of scrotum: Secondary | ICD-10-CM

## 2022-04-22 ENCOUNTER — Encounter: Payer: Self-pay | Admitting: Internal Medicine

## 2022-05-05 ENCOUNTER — Ambulatory Visit (HOSPITAL_COMMUNITY)
Admission: RE | Admit: 2022-05-05 | Discharge: 2022-05-05 | Disposition: A | Discharge status: Home / Self Care | Payer: Medicare Other | Source: Ambulatory Visit | Attending: Family Medicine | Admitting: Family Medicine

## 2022-05-05 DIAGNOSIS — N433 Hydrocele, unspecified: Secondary | ICD-10-CM | POA: Diagnosis not present

## 2022-05-05 DIAGNOSIS — N492 Inflammatory disorders of scrotum: Secondary | ICD-10-CM | POA: Insufficient documentation

## 2022-05-11 DIAGNOSIS — G8929 Other chronic pain: Secondary | ICD-10-CM | POA: Diagnosis not present

## 2022-05-11 DIAGNOSIS — M25561 Pain in right knee: Secondary | ICD-10-CM | POA: Diagnosis not present

## 2022-05-11 DIAGNOSIS — Z1330 Encounter for screening examination for mental health and behavioral disorders, unspecified: Secondary | ICD-10-CM | POA: Diagnosis not present

## 2022-05-13 ENCOUNTER — Ambulatory Visit (INDEPENDENT_AMBULATORY_CARE_PROVIDER_SITE_OTHER): Payer: Medicare Other | Admitting: Urology

## 2022-05-13 ENCOUNTER — Encounter: Payer: Self-pay | Admitting: Urology

## 2022-05-13 VITALS — BP 99/69 | HR 103

## 2022-05-13 DIAGNOSIS — N433 Hydrocele, unspecified: Secondary | ICD-10-CM

## 2022-05-13 DIAGNOSIS — N5201 Erectile dysfunction due to arterial insufficiency: Secondary | ICD-10-CM

## 2022-05-13 DIAGNOSIS — I10 Essential (primary) hypertension: Secondary | ICD-10-CM | POA: Diagnosis not present

## 2022-05-13 DIAGNOSIS — I4819 Other persistent atrial fibrillation: Secondary | ICD-10-CM | POA: Diagnosis not present

## 2022-05-13 LAB — URINALYSIS, ROUTINE W REFLEX MICROSCOPIC
Bilirubin, UA: NEGATIVE
Glucose, UA: NEGATIVE
Ketones, UA: NEGATIVE
Leukocytes,UA: NEGATIVE
Nitrite, UA: NEGATIVE
Protein,UA: NEGATIVE
RBC, UA: NEGATIVE
Specific Gravity, UA: 1.015 (ref 1.005–1.030)
Urobilinogen, Ur: 0.2 mg/dL (ref 0.2–1.0)
pH, UA: 5.5 (ref 5.0–7.5)

## 2022-05-13 MED ORDER — TADALAFIL 20 MG PO TABS: 20.0000 mg | ORAL_TABLET | Freq: Every day | ORAL | 5 refills | Status: DC | PRN

## 2022-05-13 NOTE — Progress Notes (Signed)
05/13/2022 9:10 AM   Calvin Henry 1960-05-15 [id number]  Referring provider: [name], PA-C [address] [place],  [place] [address]  Scrotal swelling   HPI: [name] is a 62yo here for evaluation of bilateral hydroceles and erectile dysfunction. For the past 4-5 months he has noted worsening scrotal swelling. He had a scrotal US 1 week ago that showed bilateral large hydroceles. He has pain with ambulation and retraction of his penis. For the past year he has noted difficulty getting a firm erection and he cannot maintain the erection. He previously tried sildenafil and tadalafil which ave him a firmer erection. He is currently in afib and is scheduled for an ablation in 2 weeks.    PMH: Past Medical History:  Diagnosis Date   Acid reflux    Arthritis    Atrial flutter ([place])    a. diagnosed in 02/2017 with spontaneous conversion back to NSR.    Chronic back pain    Essential hypertension    H. pylori infection    Treated with Prevpac   Hemorrhoids    PSVT (paroxysmal supraventricular tachycardia)    a. s/p ablation in 12/2016   Tubular adenoma     Surgical History: Past Surgical History:  Procedure Laterality Date   COLONOSCOPY  08/07/10   friable anal canal otherwise normal   COLONOSCOPY [name] 03/18/2017   Procedure: COLONOSCOPY;  Surgeon: Daneil Dolin, MD;  [facility];  Service: Endoscopy;  Laterality: [name];  1:15pm   COLONOSCOPY WITH ESOPHAGOGASTRODUODENOSCOPY (EGD) [name] 07/06/2013   Dr. Gala Romney- inflamed hemorrhoids- multiple colonic polyps= tubular adenoma. stomach bx= hpylori treated with prevpac   ESOPHAGOGASTRODUODENOSCOPY  08/07/10   schatzkis ring otherwise normal, s/p 56-F dilation, small hiatal hernia.chronic active gastritis on bx   HEMORRHOID BANDING     HERNIA REPAIR     SVT ABLATION [name] 12/15/2016   Procedure: SVT Ablation;  Surgeon: [name], MD;  Location: [facility];  Service: Cardiovascular;  Laterality:  [name];   TOTAL HIP ARTHROPLASTY Left 10/04/2015   Procedure: LEFT TOTAL HIP ARTHROPLASTY ANTERIOR APPROACH;  Surgeon: Mcarthur Rossetti, MD;  [facility]: [facility];  Service: Orthopedics;  Laterality: Left;   UMBILICAL HERNIA REPAIR  08/05/2011   Procedure: HERNIA REPAIR UMBILICAL ADULT;  Surgeon: Jamesetta So, MD;  Location: [facility];  Service: General;  Laterality: [name];    Home Medications:  Allergies as of 05/13/2022   No Known Allergies      Medication List        Accurate as of May 13, 2022  9:10 AM. If you have any questions, ask your nurse or doctor.          allopurinol 100 MG tablet Commonly known as: ZYLOPRIM Take 100 mg by mouth daily.   amLODipine 10 MG tablet Commonly known as: NORVASC Take by mouth.   amoxicillin 500 MG capsule Commonly known as: AMOXIL Take 500 mg by mouth every 8 (eight) hours.   apixaban 5 MG Tabs tablet Commonly known as: ELIQUIS Take by mouth.   aspirin EC 81 MG tablet Take 81 mg by mouth daily.   aspirin EC 81 MG tablet Take by mouth.   atorvastatin 20 MG tablet Commonly known as: LIPITOR Take 2 tablets by mouth daily.   atorvastatin 40 MG tablet Commonly known as: LIPITOR Take 40 mg by mouth daily.   atorvastatin 40 MG tablet Commonly known as: LIPITOR Take by mouth.   diclofenac 75 MG EC tablet Commonly  known as: VOLTAREN Take 1 tablet by mouth every morning.   diclofenac 75 MG EC tablet Commonly known as: VOLTAREN TAKE 1 TABLET BY MOUTH TWICE DAILY WITH A MEAL   furosemide 20 MG tablet Commonly known as: LASIX Take 20 mg by mouth 2 (two) times daily.   isosorbide mononitrate 30 MG 24 hr tablet Commonly known as: IMDUR Take by mouth.   losartan 100 MG tablet Commonly known as: COZAAR Take 100 mg by mouth daily.   metoprolol succinate 50 MG 24 hr tablet Commonly known as: TOPROL-XL TAKE 1 TABLET BY MOUTH DAILY   olmesartan 40 MG tablet Commonly known as: BENICAR Take by mouth.    oxyCODONE-acetaminophen 10-325 MG tablet Commonly known as: PERCOCET Take 1 tablet by mouth every 4 (four) hours as needed for pain.   predniSONE 10 MG (21) Tbpk tablet Commonly known as: STERAPRED UNI-PAK 21 TAB Take six pills the first day;5 pills the next day;4 pills the next day;3 pills the next day; 2 pills the next day,one the final day.        Allergies: No Known Allergies  Family History: Family History  Problem Relation Age of Onset   Diabetes Mother    Hypertension Mother    Colon cancer Neg Hx     Social History:  reports that he has been smoking cigars and cigarettes. He has a 5.00 pack-year smoking history. He has never used smokeless tobacco. He reports current alcohol use. He reports that he does not use drugs.  ROS: All other review of systems were reviewed and are negative except what is noted above in HPI  Physical Exam: BP 99/69   Pulse (!) 103   Constitutional:  Alert and oriented, No acute distress. HEENT: [place] AT, moist mucus membranes.  Trachea midline, no masses. Cardiovascular: No clubbing, cyanosis, or edema. Respiratory: Normal respiratory effort, no increased work of breathing. GI: Abdomen is soft, nontender, nondistended, no abdominal masses GU: No CVA tenderness. Circumcised phallus. No masses/lesions on penis, testis, scrotum. Large 10cm left hydrocele, right 7cm hydrocele Lymph: No cervical or inguinal lymphadenopathy. Skin: No rashes, bruises or suspicious lesions. Neurologic: Grossly intact, no focal deficits, moving all 4 extremities. Psychiatric: Normal mood and affect.  Laboratory Data: Lab Results  Component Value Date   WBC 6.1 02/15/2017   HGB 15.6 02/15/2017   HCT 46.8 02/15/2017   MCV 97.9 02/15/2017   PLT 280 02/15/2017    Lab Results  Component Value Date   CREATININE 1.43 (H) 03/01/2017    No results found for: "PSA"  No results found for: "TESTOSTERONE"  No results found for: "HGBA1C"  Urinalysis     Component Value Date/Time   COLORURINE YELLOW 08/17/2013 [place] 08/17/2013 1403   LABSPEC 1.020 08/17/2013 1403   PHURINE 5.5 08/17/2013 1403   GLUCOSEU NEGATIVE 08/17/2013 1403   [place] 08/17/2013 1403   [place] 08/17/2013 1403   [place] 08/17/2013 1403   PROTEINUR NEGATIVE 08/17/2013 1403   UROBILINOGEN 0.2 08/17/2013 1403   NITRITE NEGATIVE 08/17/2013 1403   [place] 08/17/2013 1403    No results found for: "LABMICR", "WBCUA", "RBCUA", "LABEPIT", "MUCUS", "BACTERIA"  Pertinent Imaging:  No results found for this or any previous visit.  No results found for this or any previous visit.  No results found for this or any previous visit.  No results found for this or any previous visit.  No results found for this or any previous visit.  No valid procedures  specified. No results found for this or any previous visit.  No results found for this or any previous visit.   Assessment & Plan:    1. Bilateral hydrocele We discussed the management of hydroceles including observation, aspiration and hydrocelectomy. After discussing the options the patient elects for bilateral hydrocelectomy. Risks/benefits/alternatives discussed - Urinalysis, Routine w reflex microscopic  2. Erectile dysfunction - we will defer therapy until after his cardiac ablation    No follow-ups on file.  Nicolette Bang, MD  [facility] [place]

## 2022-05-13 NOTE — Patient Instructions (Signed)
Hydrocele, Adult A hydrocele is a collection of fluid in the loose pouch of skin that holds the testicles (scrotum). It can occur in one or both testicles. This may happen because: The amount of fluid produced in the scrotum is not absorbed by the rest of the body. Fluid from the abdomen fills the scrotum. Normally, the testicles develop in the abdomen and then drop into the scrotum before birth. The tube that the testicles travel through usually closes after the testicles drop. If the tube does not close, fluid from the abdomen can fill the scrotum. This is not very common in adults. What are the causes? A hydrocele may be caused by: An injury to the scrotum. An infection. Decreased blood flow to the scrotum. Twisting of a testicle (testicular torsion). A birth defect. A tumor or cancer of the testicle. Sometimes, the cause is not known. What are the signs or symptoms? A hydrocele feels like a water-filled balloon. It may also feel heavy. Other symptoms include: Swelling of the scrotum. The swelling may decrease when you lie down. You may also notice more swelling at night than in the morning. This is called a communicating hydrocele, in which the fluid in the scrotum goes back into the abdominal cavity when the position of the scrotum changes. Swelling of the groin. Mild discomfort in the scrotum. Pain. This can develop if the hydrocele was caused by infection or twisting. The larger the hydrocele, the more likely you are to have pain. Swelling may also cause pain. How is this diagnosed? This condition may be diagnosed based on a physical exam and your medical history. You may also have tests, including: Imaging tests, such as an ultrasound. A transillumination test. This test takes place in a dark room where a light is placed on the skin of the scrotum. Clear liquid will not impede the light and the scrotum will be illuminated. This helps a health care provider distinguish a hydrocele from a  tumor. Blood or urine tests. How is this treated? Most hydroceles go away on their own. If you have no discomfort or pain, your health care provider may suggest close monitoring of your condition until the condition goes away or symptoms develop. This is called watch and wait or watchful waiting. If treatment is needed, it may include: Treating an underlying condition. This may include taking an antibiotic medicine to treat an infection. Having surgery to stop fluid from collecting in the scrotum. Having surgery to drain the fluid. Surgery may include: Hydrocelectomy. For this procedure, an incision is made in the scrotum to remove the fluid. Needle aspiration. A needle is used to drain fluid. However, the fluid buildup will come back quickly and may lead to an infection of the scrotum. This is rarely done. Follow these instructions at home: Medicines Take over-the-counter and prescription medicines only as told by your health care provider. If you were prescribed an antibiotic medicine, take it as told by your health care provider. Do not stop taking the antibiotic even if you start to feel better. General instructions Watch the hydrocele for any changes. Keep all follow-up visits. This is important. Contact a health care provider if: You notice any changes in the hydrocele. The swelling in your scrotum or groin gets worse. The hydrocele becomes red, firm, painful, or tender to the touch. You have a fever. Get help right away if you: Develop a lot of pain or your pain becomes worse. Have chills. Have a high fever. Summary A hydrocele is  a collection of fluid in the loose pouch of skin that holds the testicles (scrotum). A hydrocele can cause swelling, discomfort, and pain. In adults, the cause of a hydrocele may not be known. However, it is sometimes caused by an infection or the twisting of a testicle. Hydroceles often go away on their own. If a hydrocele causes pain, treating the  underlying cause may be needed to ease the pain. This information is not intended to replace advice given to you by your health care provider. Make sure you discuss any questions you have with your health care provider. Document Revised: 11/14/2020 Document Reviewed: 11/14/2020 Elsevier Patient Education  2023 Elsevier Inc.  

## 2022-05-15 DIAGNOSIS — N492 Inflammatory disorders of scrotum: Secondary | ICD-10-CM | POA: Diagnosis not present

## 2022-05-15 DIAGNOSIS — E6609 Other obesity due to excess calories: Secondary | ICD-10-CM | POA: Diagnosis not present

## 2022-05-15 DIAGNOSIS — Z6835 Body mass index (BMI) 35.0-35.9, adult: Secondary | ICD-10-CM | POA: Diagnosis not present

## 2022-05-15 DIAGNOSIS — M5136 Other intervertebral disc degeneration, lumbar region: Secondary | ICD-10-CM | POA: Diagnosis not present

## 2022-05-15 DIAGNOSIS — G894 Chronic pain syndrome: Secondary | ICD-10-CM | POA: Diagnosis not present

## 2022-05-15 DIAGNOSIS — I4819 Other persistent atrial fibrillation: Secondary | ICD-10-CM | POA: Diagnosis not present

## 2022-05-15 DIAGNOSIS — N529 Male erectile dysfunction, unspecified: Secondary | ICD-10-CM | POA: Diagnosis not present

## 2022-05-18 DIAGNOSIS — I425 Other restrictive cardiomyopathy: Secondary | ICD-10-CM | POA: Diagnosis not present

## 2022-05-18 DIAGNOSIS — I351 Nonrheumatic aortic (valve) insufficiency: Secondary | ICD-10-CM | POA: Diagnosis not present

## 2022-05-19 ENCOUNTER — Ambulatory Visit (INDEPENDENT_AMBULATORY_CARE_PROVIDER_SITE_OTHER): Payer: Medicare Other | Admitting: Gastroenterology

## 2022-05-19 ENCOUNTER — Encounter: Payer: Self-pay | Admitting: Gastroenterology

## 2022-05-19 VITALS — BP 101/63 | HR 99 | Temp 97.8°F | Ht 66.0 in | Wt 236.4 lb

## 2022-05-19 DIAGNOSIS — Z860101 Personal history of adenomatous and serrated colon polyps: Secondary | ICD-10-CM

## 2022-05-19 DIAGNOSIS — R1319 Other dysphagia: Secondary | ICD-10-CM

## 2022-05-19 DIAGNOSIS — Z86010 Personal history of colonic polyps: Secondary | ICD-10-CM | POA: Diagnosis not present

## 2022-05-19 DIAGNOSIS — K5900 Constipation, unspecified: Secondary | ICD-10-CM | POA: Diagnosis not present

## 2022-05-19 MED ORDER — POLYETHYLENE GLYCOL 3350 17 GM/SCOOP PO POWD: ORAL | 11 refills | Status: DC

## 2022-05-19 NOTE — Progress Notes (Unsigned)
GI Office Note    Referring Provider: [name], PA* Primary Care Physician:  Scherrie Bateman  Primary Gastroenterologist: Garfield Cornea, MD   Chief Complaint   Chief Complaint  Patient presents with   Colonoscopy     History of Present Illness   Calvin Henry is a 62 y.o. male presenting today with history of persistent atrial fibrillation, CAD, stage 3 CKD, hypertension, restrictive cardiomyopathy followed at [facility] who presents to schedule 5 year surveillance colonoscopy. Patient's last colonoscopy was in December 2018.  Patient has been having a lot of other health issues.  He is currently scheduled on February 13 for procedure for persistent A-fib.  He is chronically on Eliquis.  Having a lot of right knee pain, surgical intervention on hold because of cardiac issues.  States he also has large bilateral hydroceles but prefers not to have any surgical intervention for this if he does not have to.  From a GI standpoint, he feels like his stools are not always productive.  He passes some stool most days.  No melena or rectal bleeding.  No odynophagia, heartburn.  He often feels like liquid or solid food sticks in the lower esophagus, happens most days.  No unintentional weight loss.   Colonoscopy December 2018: -Diverticulosis -Surveillance colonoscopy in 5 years due to previous adenomatous colon polyps     Medications   Current Outpatient Medications  Medication Sig Dispense Refill   apixaban (ELIQUIS) 5 MG TABS tablet Take by mouth.     atorvastatin (LIPITOR) 80 MG tablet Take 80 mg by mouth daily.     Cholecalciferol (VITAMIN D3 SUPER STRENGTH) 50 MCG (2000 UT) CAPS Take 1 capsule by mouth daily.     furosemide (LASIX) 20 MG tablet Take 20 mg by mouth 2 (two) times daily.     isosorbide mononitrate (IMDUR) 30 MG 24 hr tablet Take by mouth.     metoprolol succinate (TOPROL-XL) 50 MG 24 hr tablet TAKE 1 TABLET BY MOUTH DAILY 90  tablet 3   oxyCODONE-acetaminophen (PERCOCET) 10-325 MG tablet Take 1 tablet by mouth every 4 (four) hours as needed for pain.     No current facility-administered medications for this visit.    Allergies   Allergies as of 05/19/2022   (No Known Allergies)    Past Medical History   Past Medical History:  Diagnosis Date   A-fib ([place])    Acid reflux    Arthritis    Atrial flutter ([place])    a. diagnosed in 02/2017 with spontaneous conversion back to NSR.    CAD (coronary artery disease)    Chronic back pain    Essential hypertension    H. pylori infection    Treated with Prevpac   Hemorrhoids    PSVT (paroxysmal supraventricular tachycardia)    a. s/p ablation in 12/2016   Restrictive cardiomyopathy ([place])    Tubular adenoma     Past Surgical History   Past Surgical History:  Procedure Laterality Date   COLONOSCOPY  08/07/10   friable anal canal otherwise normal   COLONOSCOPY [name] 03/18/2017   Procedure: COLONOSCOPY;  Surgeon: Daneil Dolin, MD;  [facility];  Service: Endoscopy;  Laterality: [name];  1:15pm   COLONOSCOPY WITH ESOPHAGOGASTRODUODENOSCOPY (EGD) [name] 07/06/2013   Dr. Gala Romney- inflamed hemorrhoids- multiple colonic polyps= tubular adenoma. stomach bx= hpylori treated with prevpac   ESOPHAGOGASTRODUODENOSCOPY  08/07/10   schatzkis ring otherwise normal, s/p 56-F dilation, small hiatal hernia.chronic  active gastritis on bx   HEMORRHOID BANDING     HERNIA REPAIR     SVT ABLATION [name] 12/15/2016   Procedure: SVT Ablation;  Surgeon: [name], MD;  Location: [place] [facility];  Service: Cardiovascular;  Laterality: [name];   TOTAL HIP ARTHROPLASTY Left 10/04/2015   Procedure: LEFT TOTAL HIP ARTHROPLASTY ANTERIOR APPROACH;  Surgeon: Mcarthur Rossetti, MD;  [facility];  Service: Orthopedics;  Laterality: Left;   UMBILICAL HERNIA REPAIR  08/05/2011   Procedure: HERNIA REPAIR UMBILICAL ADULT;  Surgeon: Jamesetta So, MD;  Location: [facility];  Service:  General;  Laterality: [name];    Past Family History   Family History  Problem Relation Age of Onset   Diabetes Mother    Hypertension Mother    Colon cancer Neg Hx     Past Social History   Social History   Socioeconomic History   Marital status: Married    Spouse name: Not on file   Number of children: Not on file   Years of education: Not on file   Highest education level: Not on file  Occupational History   Not on file  Tobacco Use   Smoking status: Former    Packs/day: 0.50    Years: 10.00    Total pack years: 5.00    Types: Cigars, Cigarettes    Quit date: 12/01/1986    Years since quitting: 35.4   Smokeless tobacco: Never   Tobacco comments:    Quit x 20 plus years  Vaping Use   Vaping Use: Never used  Substance and Sexual Activity   Alcohol use: Not Currently    Comment: a beer 1-2 times a week   Drug use: No    Comment: remote marijuana use    Sexual activity: Yes  Other Topics Concern   Not on file  Social History Narrative   ** Merged History Encounter **       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 Eyes: Negative for vision changes.  ENT: Negative for hoarseness, difficulty swallowing , nasal congestion. CV: Negative for chest pain, angina, palpitations, dyspnea on exertion, peripheral edema.  Respiratory: Negative for dyspnea at rest, dyspnea on exertion, cough, sputum, wheezing.  GI: See history of present illness. GU:  Negative for dysuria, hematuria, urinary incontinence, urinary frequency, nocturnal urination.  MS: Positive right knee pain. No low back pain.  Derm:  Negative for rash or itching.  Neuro: Negative for weakness, abnormal sensation, seizure, frequent headaches, memory loss,  confusion.  Psych: Negative for anxiety, depression, suicidal ideation, hallucinations.  Endo: Negative for unusual weight change.  Heme: Negative for bruising or bleeding. Allergy: Negative for rash or hives.  Physical Exam   BP 101/63 (BP Location: Right Arm, Patient Position: Sitting, Cuff Size: Large)   Pulse 99   Temp 97.8 F (36.6 C) (Oral)   Ht '5\' 6"'$  (1.676 m)   Wt 236 lb 6.4 oz (107.2 kg)   SpO2 98%   BMI 38.16 kg/m    General: Well-nourished, well-developed in no acute distress.  Head: Normocephalic, atraumatic.  Eyes: Conjunctiva pink, no icterus. Mouth: Oropharyngeal mucosa moist and pink   Neck: Supple without thyromegaly, masses, or lymphadenopathy.  Lungs: Clear to auscultation bilaterally.  Heart: Regular rate, no murmurs rubs or gallops.  Irregular rhythm Abdomen: Bowel sounds are normal, nontender, nondistended, no hepatosplenomegaly or masses,  no abdominal bruits or hernia, no rebound or guarding.   Rectal: not performed Extremities: No lower extremity edema. No clubbing or deformities.  Neuro: Alert and oriented x 4 , grossly normal neurologically.  Skin: Warm and dry, no rash or jaundice.   Psych: Alert and cooperative, normal mood and affect.  Labs   Labs from November 2023: White blood cell count 5000, hemoglobin 13.4, platelets 276,000, BUN 17, creatinine 1.6, estimated GFR 49, LFTs normal Imaging Studies   US SCROTUM W/DOPPLER  Result Date: 05/06/2022 CLINICAL DATA:  Inflammatory disorders of scrotum EXAM: ULTRASOUND OF SCROTUM TECHNIQUE: Complete ultrasound examination of the testicles, epididymis, and other scrotal structures was performed. COMPARISON:  04/04/2012 FINDINGS: The right testis measured 4.0 x 3.0 x 2.7 cm, and the left testis measured 3.9 x 2.5 x 2.5 cm. Cysts identified on the left measuring 0.6 cm and 0.9 cm.  The cysts appear to arise from the tunica albuginea and were present in 2013. The testes demonstrated blood flow with Doppler. There was an unremarkable appearance of the epididymides. Large bilateral hydroceles noted. IMPRESSION: 1. Left sided tunic albuginea cysts, a stable finding compared to 2013. 2. Large bilateral hydroceles. Electronically Signed   By: Sammie Bench M.D.   On: 05/06/2022 15:45    Assessment   Constipation: poorly managed. Will start miralax 17g BID until regular soft stools, then continue once daily.    Esophageal dysphagia: daily symptoms with liquids/solids. History of Schatzki's ring in the past. Suspect recurrent ring/stricture.   H/O adenomatous colon polyps: due for surveillance exam.   PLAN   Miralax 17g bid until soft BM, then daily. Would offer him and EGD/ED/colonoscopy when he has completed cardiac evaluation. We will plan to have him return in 3 months to schedule at that time if appropriate.   Laureen Ochs. Bobby Rumpf, [place], [facility]

## 2022-05-19 NOTE — Patient Instructions (Addendum)
We will hold off on colonoscopy (for history of polyps) and possible upper endoscopy (for issues swallowing) until you have completed your heart procedures.  For constipation, start Miralax 17 grams (one capful) twice daily until regular soft stools, then continue once daily. You buy this over the counter.  Return if 3 months to schedule colonoscopy and upper endoscopy if you have been cleared for this by your heart doctor.   It was a pleasure to see you today. I want to create trusting relationships with patients and provide genuine, compassionate, and quality care. I truly value your feedback, so please be on the lookout for a survey regarding your visit with me today. I appreciate your time in completing this!

## 2022-05-26 DIAGNOSIS — E785 Hyperlipidemia, unspecified: Secondary | ICD-10-CM | POA: Diagnosis not present

## 2022-05-26 DIAGNOSIS — E669 Obesity, unspecified: Secondary | ICD-10-CM | POA: Diagnosis not present

## 2022-05-26 DIAGNOSIS — Z6837 Body mass index (BMI) 37.0-37.9, adult: Secondary | ICD-10-CM | POA: Diagnosis not present

## 2022-05-26 DIAGNOSIS — Z7901 Long term (current) use of anticoagulants: Secondary | ICD-10-CM | POA: Diagnosis not present

## 2022-05-26 DIAGNOSIS — I25119 Atherosclerotic heart disease of native coronary artery with unspecified angina pectoris: Secondary | ICD-10-CM | POA: Diagnosis not present

## 2022-05-26 DIAGNOSIS — Z79899 Other long term (current) drug therapy: Secondary | ICD-10-CM | POA: Diagnosis not present

## 2022-05-26 DIAGNOSIS — I129 Hypertensive chronic kidney disease with stage 1 through stage 4 chronic kidney disease, or unspecified chronic kidney disease: Secondary | ICD-10-CM | POA: Diagnosis not present

## 2022-05-26 DIAGNOSIS — I4819 Other persistent atrial fibrillation: Secondary | ICD-10-CM | POA: Diagnosis not present

## 2022-05-26 DIAGNOSIS — I425 Other restrictive cardiomyopathy: Secondary | ICD-10-CM | POA: Diagnosis not present

## 2022-05-26 DIAGNOSIS — N189 Chronic kidney disease, unspecified: Secondary | ICD-10-CM | POA: Diagnosis not present

## 2022-05-27 DIAGNOSIS — N189 Chronic kidney disease, unspecified: Secondary | ICD-10-CM | POA: Diagnosis not present

## 2022-05-27 DIAGNOSIS — E785 Hyperlipidemia, unspecified: Secondary | ICD-10-CM | POA: Diagnosis not present

## 2022-05-27 DIAGNOSIS — I425 Other restrictive cardiomyopathy: Secondary | ICD-10-CM | POA: Diagnosis not present

## 2022-05-27 DIAGNOSIS — Z79899 Other long term (current) drug therapy: Secondary | ICD-10-CM | POA: Diagnosis not present

## 2022-05-27 DIAGNOSIS — I25119 Atherosclerotic heart disease of native coronary artery with unspecified angina pectoris: Secondary | ICD-10-CM | POA: Diagnosis not present

## 2022-05-27 DIAGNOSIS — E669 Obesity, unspecified: Secondary | ICD-10-CM | POA: Diagnosis not present

## 2022-05-27 DIAGNOSIS — Z7901 Long term (current) use of anticoagulants: Secondary | ICD-10-CM | POA: Diagnosis not present

## 2022-05-27 DIAGNOSIS — I4819 Other persistent atrial fibrillation: Secondary | ICD-10-CM | POA: Diagnosis not present

## 2022-05-27 DIAGNOSIS — I129 Hypertensive chronic kidney disease with stage 1 through stage 4 chronic kidney disease, or unspecified chronic kidney disease: Secondary | ICD-10-CM | POA: Diagnosis not present

## 2022-05-27 DIAGNOSIS — Z6837 Body mass index (BMI) 37.0-37.9, adult: Secondary | ICD-10-CM | POA: Diagnosis not present

## 2022-05-29 DIAGNOSIS — Z8679 Personal history of other diseases of the circulatory system: Secondary | ICD-10-CM | POA: Diagnosis not present

## 2022-05-29 DIAGNOSIS — K1370 Unspecified lesions of oral mucosa: Secondary | ICD-10-CM | POA: Diagnosis not present

## 2022-05-29 DIAGNOSIS — Z09 Encounter for follow-up examination after completed treatment for conditions other than malignant neoplasm: Secondary | ICD-10-CM | POA: Diagnosis not present

## 2022-06-12 ENCOUNTER — Ambulatory Visit: Payer: Medicare Other | Admitting: Urology

## 2022-06-12 DIAGNOSIS — E6609 Other obesity due to excess calories: Secondary | ICD-10-CM | POA: Diagnosis not present

## 2022-06-12 DIAGNOSIS — I10 Essential (primary) hypertension: Secondary | ICD-10-CM | POA: Diagnosis not present

## 2022-06-12 DIAGNOSIS — F1120 Opioid dependence, uncomplicated: Secondary | ICD-10-CM | POA: Diagnosis not present

## 2022-06-12 DIAGNOSIS — N529 Male erectile dysfunction, unspecified: Secondary | ICD-10-CM | POA: Diagnosis not present

## 2022-06-12 DIAGNOSIS — I4819 Other persistent atrial fibrillation: Secondary | ICD-10-CM | POA: Diagnosis not present

## 2022-06-12 DIAGNOSIS — Z6836 Body mass index (BMI) 36.0-36.9, adult: Secondary | ICD-10-CM | POA: Diagnosis not present

## 2022-06-12 DIAGNOSIS — M1711 Unilateral primary osteoarthritis, right knee: Secondary | ICD-10-CM | POA: Diagnosis not present

## 2022-06-15 ENCOUNTER — Telehealth: Payer: Self-pay

## 2022-06-15 NOTE — Telephone Encounter (Signed)
I spoke with Calvin Henry. We have discussed possible surgery dates and 07/30/2022 was agreed upon by all parties. Patient given information about surgery date, what to expect pre-operatively and post operatively.    We discussed that a pre-op nurse will be calling to set up the pre-op visit that will take place prior to surgery. Informed patient that our office will communicate any additional care to be provided after surgery.    Patients questions or concerns were discussed during our call. Advised to call our office should there be any additional information, questions or concerns that arise. Patient verbalized understanding.    Pending Eliquis hold

## 2022-06-15 NOTE — Telephone Encounter (Signed)
   Pre-operative Risk Assessment    Patient Name: Calvin Henry  DOB: [birth year] MRN: [id number]      Request for Surgical Clearance    Procedure:   BILATERAL HYDROCELETOMY PROCEDURE   Date of Surgery:  Clearance 07/30/22                                 Surgeon:  DR. PATRICK MCKENZIE Surgeon's Group or Practice Name:  [facility] Phone number:  [phone] Fax number:  [phone]   Type of Clearance Requested:   - Medical  - Pharmacy:  Hold Apixaban (Eliquis) 2 DAYS BEFORE   Type of Anesthesia:  General    Additional requests/questions:    Signed, Jacinta Shoe   06/15/2022, 2:07 PM

## 2022-06-16 DIAGNOSIS — E785 Hyperlipidemia, unspecified: Secondary | ICD-10-CM | POA: Diagnosis not present

## 2022-06-16 DIAGNOSIS — I10 Essential (primary) hypertension: Secondary | ICD-10-CM | POA: Diagnosis not present

## 2022-06-16 DIAGNOSIS — I4819 Other persistent atrial fibrillation: Secondary | ICD-10-CM | POA: Diagnosis not present

## 2022-06-16 NOTE — Telephone Encounter (Signed)
Cardiology request sent to [facility]

## 2022-06-16 NOTE — Telephone Encounter (Signed)
Preoperative team, patient follows with [facility] cardiology.  Recommendations on preoperative cardiac evaluation and medication hold/administration will need to come from prescribing/evaluating providers.  Please reach out to requesting office and let them know that they will need to send request to [facility] cardiology.  Thank you for your help.  Jossie Ng. Raney Antwine NP-C     06/16/2022, 10:13 AM [place] [address] Office [phone] Fax [phone]

## 2022-06-16 NOTE — Telephone Encounter (Signed)
Notes have been sent to requesting office. Please see notes pt is not followed by [facility].

## 2022-06-16 NOTE — Telephone Encounter (Signed)
Pt does not follow with [facility], he follows with [facility]. Clearance should come from managing provider.

## 2022-06-19 DIAGNOSIS — I4891 Unspecified atrial fibrillation: Secondary | ICD-10-CM | POA: Diagnosis not present

## 2022-06-19 DIAGNOSIS — I444 Left anterior fascicular block: Secondary | ICD-10-CM | POA: Diagnosis not present

## 2022-06-19 DIAGNOSIS — R0789 Other chest pain: Secondary | ICD-10-CM | POA: Diagnosis not present

## 2022-06-19 DIAGNOSIS — M79602 Pain in left arm: Secondary | ICD-10-CM | POA: Diagnosis not present

## 2022-06-19 DIAGNOSIS — Z87891 Personal history of nicotine dependence: Secondary | ICD-10-CM | POA: Diagnosis not present

## 2022-06-19 DIAGNOSIS — R079 Chest pain, unspecified: Secondary | ICD-10-CM | POA: Diagnosis not present

## 2022-06-19 DIAGNOSIS — R200 Anesthesia of skin: Secondary | ICD-10-CM | POA: Diagnosis not present

## 2022-06-19 DIAGNOSIS — X58XXXA Exposure to other specified factors, initial encounter: Secondary | ICD-10-CM | POA: Diagnosis not present

## 2022-06-19 DIAGNOSIS — S46912A Strain of unspecified muscle, fascia and tendon at shoulder and upper arm level, left arm, initial encounter: Secondary | ICD-10-CM | POA: Diagnosis not present

## 2022-06-23 DIAGNOSIS — I2510 Atherosclerotic heart disease of native coronary artery without angina pectoris: Secondary | ICD-10-CM | POA: Diagnosis not present

## 2022-06-23 DIAGNOSIS — E669 Obesity, unspecified: Secondary | ICD-10-CM | POA: Diagnosis not present

## 2022-06-23 DIAGNOSIS — Z7901 Long term (current) use of anticoagulants: Secondary | ICD-10-CM | POA: Diagnosis not present

## 2022-06-23 DIAGNOSIS — K219 Gastro-esophageal reflux disease without esophagitis: Secondary | ICD-10-CM | POA: Diagnosis not present

## 2022-06-23 DIAGNOSIS — I4819 Other persistent atrial fibrillation: Secondary | ICD-10-CM | POA: Diagnosis not present

## 2022-06-23 DIAGNOSIS — I10 Essential (primary) hypertension: Secondary | ICD-10-CM | POA: Diagnosis not present

## 2022-06-23 DIAGNOSIS — E785 Hyperlipidemia, unspecified: Secondary | ICD-10-CM | POA: Diagnosis not present

## 2022-06-23 DIAGNOSIS — I425 Other restrictive cardiomyopathy: Secondary | ICD-10-CM | POA: Diagnosis not present

## 2022-06-24 DIAGNOSIS — I4819 Other persistent atrial fibrillation: Secondary | ICD-10-CM | POA: Diagnosis not present

## 2022-07-09 DIAGNOSIS — E6609 Other obesity due to excess calories: Secondary | ICD-10-CM | POA: Diagnosis not present

## 2022-07-09 DIAGNOSIS — I10 Essential (primary) hypertension: Secondary | ICD-10-CM | POA: Diagnosis not present

## 2022-07-09 DIAGNOSIS — Z6835 Body mass index (BMI) 35.0-35.9, adult: Secondary | ICD-10-CM | POA: Diagnosis not present

## 2022-07-09 DIAGNOSIS — M1611 Unilateral primary osteoarthritis, right hip: Secondary | ICD-10-CM | POA: Diagnosis not present

## 2022-07-09 DIAGNOSIS — Z7901 Long term (current) use of anticoagulants: Secondary | ICD-10-CM | POA: Diagnosis not present

## 2022-07-09 DIAGNOSIS — M1711 Unilateral primary osteoarthritis, right knee: Secondary | ICD-10-CM | POA: Diagnosis not present

## 2022-07-13 ENCOUNTER — Telehealth: Payer: Self-pay

## 2022-07-13 NOTE — Telephone Encounter (Signed)
I called and spoke with [facility]- patient is to be seen back by cardiology April 24. Cardiology will not approve Eliquis hold until patient reevaluated.   Patient called and notified of surgery to be post pone surgery to May 9th. Patient voiced understanding

## 2022-07-21 ENCOUNTER — Other Ambulatory Visit: Payer: Self-pay | Admitting: Urology

## 2022-07-21 MED ORDER — TADALAFIL 20 MG PO TABS: 20.0000 mg | ORAL_TABLET | Freq: Every day | ORAL | 5 refills | Status: DC | PRN

## 2022-07-22 NOTE — Telephone Encounter (Signed)
Patient voiced that he is interested in the trimix injection as long as it will not interfere with his current heart medication. Patient is aware that a task will be sent to MD for recommendation and someone reach out with MD recommendation. Patient voiced understanding

## 2022-07-23 ENCOUNTER — Other Ambulatory Visit: Payer: Self-pay

## 2022-07-23 DIAGNOSIS — N433 Hydrocele, unspecified: Secondary | ICD-10-CM

## 2022-07-27 ENCOUNTER — Other Ambulatory Visit (HOSPITAL_COMMUNITY): Payer: Medicare Other

## 2022-08-03 NOTE — Telephone Encounter (Signed)
Patient is aware a Rx for trimix was fax to [facility]. Patient voiced understanding

## 2022-08-05 DIAGNOSIS — Z87891 Personal history of nicotine dependence: Secondary | ICD-10-CM | POA: Diagnosis not present

## 2022-08-05 DIAGNOSIS — Z79899 Other long term (current) drug therapy: Secondary | ICD-10-CM | POA: Diagnosis not present

## 2022-08-05 DIAGNOSIS — I4819 Other persistent atrial fibrillation: Secondary | ICD-10-CM | POA: Diagnosis not present

## 2022-08-05 DIAGNOSIS — I2510 Atherosclerotic heart disease of native coronary artery without angina pectoris: Secondary | ICD-10-CM | POA: Diagnosis not present

## 2022-08-05 DIAGNOSIS — M109 Gout, unspecified: Secondary | ICD-10-CM | POA: Diagnosis not present

## 2022-08-05 DIAGNOSIS — Z7901 Long term (current) use of anticoagulants: Secondary | ICD-10-CM | POA: Diagnosis not present

## 2022-08-05 DIAGNOSIS — K219 Gastro-esophageal reflux disease without esophagitis: Secondary | ICD-10-CM | POA: Diagnosis not present

## 2022-08-05 DIAGNOSIS — I10 Essential (primary) hypertension: Secondary | ICD-10-CM | POA: Diagnosis not present

## 2022-08-05 DIAGNOSIS — Z7722 Contact with and (suspected) exposure to environmental tobacco smoke (acute) (chronic): Secondary | ICD-10-CM | POA: Diagnosis not present

## 2022-08-06 DIAGNOSIS — I4819 Other persistent atrial fibrillation: Secondary | ICD-10-CM | POA: Diagnosis not present

## 2022-08-07 DIAGNOSIS — E6609 Other obesity due to excess calories: Secondary | ICD-10-CM | POA: Diagnosis not present

## 2022-08-07 DIAGNOSIS — M1711 Unilateral primary osteoarthritis, right knee: Secondary | ICD-10-CM | POA: Diagnosis not present

## 2022-08-07 DIAGNOSIS — F1120 Opioid dependence, uncomplicated: Secondary | ICD-10-CM | POA: Diagnosis not present

## 2022-08-07 DIAGNOSIS — Z6835 Body mass index (BMI) 35.0-35.9, adult: Secondary | ICD-10-CM | POA: Diagnosis not present

## 2022-08-07 DIAGNOSIS — N492 Inflammatory disorders of scrotum: Secondary | ICD-10-CM | POA: Diagnosis not present

## 2022-08-07 DIAGNOSIS — I4819 Other persistent atrial fibrillation: Secondary | ICD-10-CM | POA: Diagnosis not present

## 2022-08-11 DIAGNOSIS — I10 Essential (primary) hypertension: Secondary | ICD-10-CM | POA: Diagnosis not present

## 2022-08-11 DIAGNOSIS — I4819 Other persistent atrial fibrillation: Secondary | ICD-10-CM | POA: Diagnosis not present

## 2022-08-14 ENCOUNTER — Ambulatory Visit: Payer: Medicare Other | Admitting: Urology

## 2022-08-18 ENCOUNTER — Encounter (HOSPITAL_COMMUNITY)
Admission: RE | Admit: 2022-08-18 | Discharge: 2022-08-18 | Disposition: A | Discharge status: Home / Self Care | Payer: Medicare Other | Source: Ambulatory Visit | Attending: Urology | Admitting: Urology

## 2022-08-20 ENCOUNTER — Ambulatory Visit (HOSPITAL_BASED_OUTPATIENT_CLINIC_OR_DEPARTMENT_OTHER): Payer: Medicare Other | Admitting: Certified Registered"

## 2022-08-20 ENCOUNTER — Encounter (HOSPITAL_COMMUNITY)
Admission: RE | Disposition: A | Discharge status: Home / Self Care | Payer: Self-pay | Source: Home / Self Care | Attending: Urology

## 2022-08-20 ENCOUNTER — Encounter (HOSPITAL_COMMUNITY): Payer: Self-pay | Admitting: Urology

## 2022-08-20 ENCOUNTER — Ambulatory Visit (HOSPITAL_COMMUNITY)
Admission: RE | Admit: 2022-08-20 | Discharge: 2022-08-20 | Disposition: A | Discharge status: Home / Self Care | Payer: Medicare Other | Attending: Urology | Admitting: Urology

## 2022-08-20 ENCOUNTER — Other Ambulatory Visit: Payer: Self-pay

## 2022-08-20 ENCOUNTER — Ambulatory Visit (HOSPITAL_COMMUNITY): Payer: Medicare Other | Admitting: Certified Registered"

## 2022-08-20 DIAGNOSIS — N433 Hydrocele, unspecified: Secondary | ICD-10-CM

## 2022-08-20 DIAGNOSIS — N529 Male erectile dysfunction, unspecified: Secondary | ICD-10-CM | POA: Insufficient documentation

## 2022-08-20 DIAGNOSIS — Z87891 Personal history of nicotine dependence: Secondary | ICD-10-CM

## 2022-08-20 DIAGNOSIS — F419 Anxiety disorder, unspecified: Secondary | ICD-10-CM

## 2022-08-20 DIAGNOSIS — I25119 Atherosclerotic heart disease of native coronary artery with unspecified angina pectoris: Secondary | ICD-10-CM | POA: Diagnosis not present

## 2022-08-20 DIAGNOSIS — I10 Essential (primary) hypertension: Secondary | ICD-10-CM | POA: Diagnosis not present

## 2022-08-20 DIAGNOSIS — Z09 Encounter for follow-up examination after completed treatment for conditions other than malignant neoplasm: Secondary | ICD-10-CM | POA: Diagnosis not present

## 2022-08-20 DIAGNOSIS — I2510 Atherosclerotic heart disease of native coronary artery without angina pectoris: Secondary | ICD-10-CM | POA: Insufficient documentation

## 2022-08-20 HISTORY — PX: HYDROCELE EXCISION: SHX482

## 2022-08-20 SURGERY — HYDROCELECTOMY
Anesthesia: General | Site: Scrotum | Laterality: Bilateral

## 2022-08-20 MED ORDER — BUPIVACAINE HCL (PF) 0.25 % IJ SOLN
INTRAMUSCULAR | Status: AC
  Filled 2022-08-20: qty 30

## 2022-08-20 MED ORDER — LIDOCAINE HCL (CARDIAC) PF 100 MG/5ML IV SOSY
PREFILLED_SYRINGE | INTRAVENOUS | Status: DC | PRN
  Administered 2022-08-20: 80 mg via INTRAVENOUS

## 2022-08-20 MED ORDER — OXYCODONE HCL 5 MG PO TABS: 5.0000 mg | ORAL_TABLET | Freq: Once | ORAL | Status: DC | PRN

## 2022-08-20 MED ORDER — PROPOFOL 10 MG/ML IV BOLUS
INTRAVENOUS | Status: DC | PRN
  Administered 2022-08-20: 230 mg via INTRAVENOUS

## 2022-08-20 MED ORDER — BUPIVACAINE HCL (PF) 0.25 % IJ SOLN
INTRAMUSCULAR | Status: DC | PRN
  Administered 2022-08-20: 10 mL

## 2022-08-20 MED ORDER — PROPOFOL 10 MG/ML IV BOLUS
INTRAVENOUS | Status: AC
  Filled 2022-08-20: qty 20

## 2022-08-20 MED ORDER — FENTANYL CITRATE (PF) 250 MCG/5ML IJ SOLN
INTRAMUSCULAR | Status: DC | PRN
  Administered 2022-08-20 (×2): 25 ug via INTRAVENOUS

## 2022-08-20 MED ORDER — FENTANYL CITRATE PF 50 MCG/ML IJ SOSY: 25.0000 ug | PREFILLED_SYRINGE | INTRAMUSCULAR | Status: DC | PRN

## 2022-08-20 MED ORDER — OXYCODONE HCL 5 MG/5ML PO SOLN: 5.0000 mg | Freq: Once | ORAL | Status: DC | PRN

## 2022-08-20 MED ORDER — LIDOCAINE HCL (PF) 2 % IJ SOLN
INTRAMUSCULAR | Status: AC
  Filled 2022-08-20: qty 5

## 2022-08-20 MED ORDER — FENTANYL CITRATE (PF) 100 MCG/2ML IJ SOLN
INTRAMUSCULAR | Status: AC
  Filled 2022-08-20: qty 2

## 2022-08-20 MED ORDER — MIDAZOLAM HCL 2 MG/2ML IJ SOLN
INTRAMUSCULAR | Status: AC
  Filled 2022-08-20: qty 2

## 2022-08-20 MED ORDER — ONDANSETRON HCL 4 MG/2ML IJ SOLN: 4.0000 mg | Freq: Once | INTRAMUSCULAR | Status: DC | PRN

## 2022-08-20 MED ORDER — DEXAMETHASONE SODIUM PHOSPHATE 10 MG/ML IJ SOLN
INTRAMUSCULAR | Status: DC | PRN
  Administered 2022-08-20: 4 mg via INTRAVENOUS

## 2022-08-20 MED ORDER — CEFAZOLIN SODIUM-DEXTROSE 2-4 GM/100ML-% IV SOLN
2.0000 g | INTRAVENOUS | Status: AC
  Administered 2022-08-20: 2 g via INTRAVENOUS

## 2022-08-20 MED ORDER — LACTATED RINGERS IV SOLN: INTRAVENOUS | Status: DC

## 2022-08-20 MED ORDER — ONDANSETRON HCL 4 MG/2ML IJ SOLN
INTRAMUSCULAR | Status: AC
  Filled 2022-08-20: qty 2

## 2022-08-20 MED ORDER — ONDANSETRON HCL 4 MG/2ML IJ SOLN
INTRAMUSCULAR | Status: DC | PRN
  Administered 2022-08-20: 4 mg via INTRAVENOUS

## 2022-08-20 MED ORDER — MIDAZOLAM HCL 2 MG/2ML IJ SOLN
INTRAMUSCULAR | Status: DC | PRN
  Administered 2022-08-20: 2 mg via INTRAVENOUS

## 2022-08-20 MED ORDER — CHLORHEXIDINE GLUCONATE 0.12 % MT SOLN: 15.0000 mL | Freq: Once | OROMUCOSAL | Status: DC

## 2022-08-20 MED ORDER — LACTATED RINGERS IV SOLN: INTRAVENOUS | Status: DC | PRN

## 2022-08-20 MED ORDER — HYDROCODONE-ACETAMINOPHEN 5-325 MG PO TABS: 1.0000 | ORAL_TABLET | Freq: Four times a day (QID) | ORAL | 0 refills | Status: DC | PRN

## 2022-08-20 MED ORDER — CEFAZOLIN SODIUM-DEXTROSE 2-4 GM/100ML-% IV SOLN
INTRAVENOUS | Status: AC
  Filled 2022-08-20: qty 100

## 2022-08-20 MED ORDER — 0.9 % SODIUM CHLORIDE (POUR BTL) OPTIME
TOPICAL | Status: DC | PRN
  Administered 2022-08-20: 1000 mL

## 2022-08-20 MED ORDER — ORAL CARE MOUTH RINSE: 15.0000 mL | Freq: Once | OROMUCOSAL | Status: DC

## 2022-08-20 MED ORDER — DEXAMETHASONE SODIUM PHOSPHATE 10 MG/ML IJ SOLN
INTRAMUSCULAR | Status: AC
  Filled 2022-08-20: qty 1

## 2022-08-20 SURGICAL SUPPLY — 28 items
ADH SKN CLS APL DERMABOND .7 (GAUZE/BANDAGES/DRESSINGS) ×1
BLADE SURG 15 STRL LF DISP TIS (BLADE) ×2 IMPLANT
BLADE SURG 15 STRL SS (BLADE) ×1
COVER LIGHT HANDLE STERIS (MISCELLANEOUS) ×4 IMPLANT
DECANTER SPIKE VIAL GLASS SM (MISCELLANEOUS) ×2 IMPLANT
DERMABOND ADVANCED .7 DNX12 (GAUZE/BANDAGES/DRESSINGS) ×2 IMPLANT
ELECT REM PT RETURN 9FT ADLT (ELECTROSURGICAL) ×1
ELECTRODE REM PT RTRN 9FT ADLT (ELECTROSURGICAL) ×2 IMPLANT
GAUZE SPONGE 4X4 12PLY STRL (GAUZE/BANDAGES/DRESSINGS) ×4 IMPLANT
GLOVE BIO SURGEON STRL SZ8 (GLOVE) ×2 IMPLANT
GLOVE BIOGEL PI IND STRL 7.0 (GLOVE) ×4 IMPLANT
GLOVE BIOGEL PI IND STRL 8 (GLOVE) ×2 IMPLANT
GOWN STRL REUS W/TWL LRG LVL3 (GOWN DISPOSABLE) ×2 IMPLANT
GOWN STRL REUS W/TWL XL LVL3 (GOWN DISPOSABLE) ×2 IMPLANT
KIT TURNOVER KIT A (KITS) ×2 IMPLANT
MANIFOLD NEPTUNE II (INSTRUMENTS) ×2 IMPLANT
NDL HYPO 25X1 1.5 SAFETY (NEEDLE) ×2 IMPLANT
NEEDLE HYPO 25X1 1.5 SAFETY (NEEDLE) ×1 IMPLANT
NS IRRIG 1000ML POUR BTL (IV SOLUTION) ×2 IMPLANT
PACK MINOR (CUSTOM PROCEDURE TRAY) ×2 IMPLANT
PAD ARMBOARD 7.5X6 YLW CONV (MISCELLANEOUS) ×2 IMPLANT
SET BASIN LINEN APH (SET/KITS/TRAYS/PACK) ×2 IMPLANT
SUPPORT SCROTAL LG STRP (MISCELLANEOUS) ×2 IMPLANT
SUT MNCRL AB 4-0 PS2 18 (SUTURE) ×2 IMPLANT
SUT VIC AB 3-0 SH 27 (SUTURE) ×2
SUT VIC AB 3-0 SH 27X BRD (SUTURE) ×2 IMPLANT
SYR CONTROL 10ML LL (SYRINGE) ×2 IMPLANT
YANKAUER SUCT 12FT TUBE ARGYLE (SUCTIONS) IMPLANT

## 2022-08-20 NOTE — Anesthesia Procedure Notes (Signed)
Procedure Name: LMA Insertion Date/Time: 08/20/2022 10:37 AM  Performed by: Oletha Cruel, CRNAPre-anesthesia Checklist: Patient identified, Emergency Drugs available, Patient being monitored, Timeout performed and Suction available Patient Re-evaluated:Patient Re-evaluated prior to induction Oxygen Delivery Method: Circle system utilized Preoxygenation: Pre-oxygenation with 100% oxygen Induction Type: IV induction Ventilation: Mask ventilation without difficulty LMA: LMA inserted LMA Size: 4.0 Number of attempts: 1 Placement Confirmation: positive ETCO2, CO2 detector and breath sounds checked- equal and bilateral Tube secured with: Tape Dental Injury: Teeth and Oropharynx as per pre-operative assessment

## 2022-08-20 NOTE — Transfer of Care (Signed)
Immediate Anesthesia Transfer of Care Note  Patient: Calvin Henry  Procedure(s) Performed: HYDROCELECTOMY ADULT (Bilateral: Scrotum)  Patient Location: PACU  Anesthesia Type:General  Level of Consciousness: drowsy and patient cooperative  Airway & Oxygen Therapy: Patient Spontanous Breathing and Patient connected to face mask oxygen  Post-op Assessment: Report given to RN and Post -op Vital signs reviewed and stable  Post vital signs: Reviewed and stable  Last Vitals:  Vitals Value Taken Time  BP    Temp    Pulse    Resp    SpO2      Last Pain:  Vitals:   08/20/22 0844  TempSrc: Oral  PainSc: 0-No pain         Complications: No notable events documented.

## 2022-08-20 NOTE — Anesthesia Preprocedure Evaluation (Signed)
Anesthesia Evaluation  Patient identified by MRN, date of birth, ID band Patient awake    Reviewed: Allergy & Precautions, H&P , NPO status , Patient's Chart, lab work & pertinent test results, reviewed documented beta blocker date and time   Airway Mallampati: II  TM Distance: >3 FB Neck ROM: full    Dental no notable dental hx.    Pulmonary neg pulmonary ROS, former smoker   Pulmonary exam normal breath sounds clear to auscultation       Cardiovascular Exercise Tolerance: Good hypertension, + angina  + CAD  negative cardio ROS  Rhythm:regular Rate:Normal     Neuro/Psych   Anxiety     negative neurological ROS  negative psych ROS   GI/Hepatic negative GI ROS, Neg liver ROS,GERD  ,,  Endo/Other  negative endocrine ROS    Renal/GU Renal diseasenegative Renal ROS  negative genitourinary   Musculoskeletal   Abdominal   Peds  Hematology negative hematology ROS (+)   Anesthesia Other Findings   Reproductive/Obstetrics negative OB ROS                              Anesthesia Physical Anesthesia Plan  ASA: 3  Anesthesia Plan: General and General LMA   Post-op Pain Management:    Induction:   PONV Risk Score and Plan: Ondansetron  Airway Management Planned:   Additional Equipment:   Intra-op Plan:   Post-operative Plan:   Informed Consent: I have reviewed the patients History and Physical, chart, labs and discussed the procedure including the risks, benefits and alternatives for the proposed anesthesia with the patient or authorized representative who has indicated his/her understanding and acceptance.     Dental Advisory Given  Plan Discussed with: CRNA  Anesthesia Plan Comments:          Anesthesia Quick Evaluation

## 2022-08-20 NOTE — Discharge Instructions (Signed)
Patient is to be discharged home. He is to follow up in 2 weeks for wound check.

## 2022-08-20 NOTE — H&P (Signed)
HPI: Calvin Henry is a 61yo here for evaluation of bilateral hydroceles and erectile dysfunction. For the past 4-5 months he has noted worsening scrotal swelling. He had a scrotal US 1 week ago that showed bilateral large hydroceles. He has pain with ambulation and retraction of his penis. For the past year he has noted difficulty getting a firm erection and he cannot maintain the erection. He previously tried sildenafil and tadalafil which ave him a firmer erection. He is currently in afib and is scheduled for an ablation in 2 weeks.      PMH:     Past Medical History:  Diagnosis Date   Acid reflux     Arthritis     Atrial flutter (HCC)      a. diagnosed in 02/2017 with spontaneous conversion back to NSR.    Chronic back pain     Essential hypertension     H. pylori infection      Treated with Prevpac   Hemorrhoids     PSVT (paroxysmal supraventricular tachycardia)      a. s/p ablation in 12/2016   Tubular adenoma        Surgical History:      Past Surgical History:  Procedure Laterality Date   COLONOSCOPY   08/07/10    friable anal canal otherwise normal   COLONOSCOPY [name] 03/18/2017    Procedure: COLONOSCOPY;  Surgeon: Corbin Ade, MD;  Location: [facility];  Service: Endoscopy;  Laterality: [name];  1:15pm   COLONOSCOPY WITH ESOPHAGOGASTRODUODENOSCOPY (EGD) [name] 07/06/2013    Dr. Jena Gauss- inflamed hemorrhoids- multiple colonic polyps= tubular adenoma. stomach bx= hpylori treated with prevpac   ESOPHAGOGASTRODUODENOSCOPY   08/07/10    schatzkis ring otherwise normal, s/p 56-F dilation, small hiatal hernia.chronic active gastritis on bx   HEMORRHOID BANDING       HERNIA REPAIR       SVT ABLATION [name] 12/15/2016    Procedure: SVT Ablation;  Surgeon: Hillis Range, MD;  [facility]: [facility];  Service: Cardiovascular;  Laterality: [name];   TOTAL HIP ARTHROPLASTY Left 10/04/2015    Procedure: LEFT TOTAL HIP ARTHROPLASTY ANTERIOR APPROACH;  Surgeon: Kathryne Hitch, MD;   Location: [facility];  Service: Orthopedics;  Laterality: Left;   UMBILICAL HERNIA REPAIR   08/05/2011    Procedure: HERNIA REPAIR UMBILICAL ADULT;  Surgeon: Dalia Heading, MD;  Location: [facility];  Service: General;  Laterality: [name];      Home Medications:  Allergies as of 05/13/2022   No Known Allergies         Medication List           Accurate as of May 13, 2022  9:10 AM. If you have any questions, ask your nurse or doctor.              allopurinol 100 MG tablet Commonly known as: ZYLOPRIM Take 100 mg by mouth daily.    amLODipine 10 MG tablet Commonly known as: NORVASC Take by mouth.    amoxicillin 500 MG capsule Commonly known as: AMOXIL Take 500 mg by mouth every 8 (eight) hours.    apixaban 5 MG Tabs tablet Commonly known as: ELIQUIS Take by mouth.    aspirin EC 81 MG tablet Take 81 mg by mouth daily.    aspirin EC 81 MG tablet Take by mouth.    atorvastatin 20 MG tablet Commonly known as: LIPITOR Take 2 tablets by mouth daily.    atorvastatin 40 MG tablet Commonly known as:  LIPITOR Take 40 mg by mouth daily.    atorvastatin 40 MG tablet Commonly known as: LIPITOR Take by mouth.    diclofenac 75 MG EC tablet Commonly known as: VOLTAREN Take 1 tablet by mouth every morning.    diclofenac 75 MG EC tablet Commonly known as: VOLTAREN TAKE 1 TABLET BY MOUTH TWICE DAILY WITH A MEAL    furosemide 20 MG tablet Commonly known as: LASIX Take 20 mg by mouth 2 (two) times daily.    isosorbide mononitrate 30 MG 24 hr tablet Commonly known as: IMDUR Take by mouth.    losartan 100 MG tablet Commonly known as: COZAAR Take 100 mg by mouth daily.    metoprolol succinate 50 MG 24 hr tablet Commonly known as: TOPROL-XL TAKE 1 TABLET BY MOUTH DAILY    olmesartan 40 MG tablet Commonly known as: BENICAR Take by mouth.    oxyCODONE-acetaminophen 10-325 MG tablet Commonly known as: PERCOCET Take 1 tablet by mouth every 4 (four) hours as needed for  pain.    predniSONE 10 MG (21) Tbpk tablet Commonly known as: STERAPRED UNI-PAK 21 TAB Take six pills the first day;5 pills the next day;4 pills the next day;3 pills the next day; 2 pills the next day,one the final day.             Allergies: No Known Allergies   Family History:      Family History  Problem Relation Age of Onset   Diabetes Mother     Hypertension Mother     Colon cancer Neg Hx        Social History:  reports that he has been smoking cigars and cigarettes. He has a 5.00 pack-year smoking history. He has never used smokeless tobacco. He reports current alcohol use. He reports that he does not use drugs.   ROS: All other review of systems were reviewed and are negative except what is noted above in HPI   Physical Exam: BP 99/69   Pulse (!) 103   Constitutional:  Alert and oriented, No acute distress. HEENT: [place] AT, moist mucus membranes.  Trachea midline, no masses. Cardiovascular: No clubbing, cyanosis, or edema. Respiratory: Normal respiratory effort, no increased work of breathing. GI: Abdomen is soft, nontender, nondistended, no abdominal masses GU: No CVA tenderness. Circumcised phallus. No masses/lesions on penis, testis, scrotum. Large 10cm left hydrocele, right 7cm hydrocele Lymph: No cervical or inguinal lymphadenopathy. Skin: No rashes, bruises or suspicious lesions. Neurologic: Grossly intact, no focal deficits, moving all 4 extremities. Psychiatric: Normal mood and affect.   Laboratory Data: Recent Labs       Lab Results  Component Value Date    WBC 6.1 02/15/2017    HGB 15.6 02/15/2017    HCT 46.8 02/15/2017    MCV 97.9 02/15/2017    PLT 280 02/15/2017        Recent Labs       Lab Results  Component Value Date    CREATININE 1.43 (H) 03/01/2017        Recent Labs  No results found for: "PSA"     Recent Labs  No results found for: "TESTOSTERONE"     Recent Labs  No results found for: "HGBA1C"     Urinalysis Labs (Brief)           Component Value Date/Time    COLORURINE YELLOW 08/17/2013 1403    APPEARANCEUR CLEAR 08/17/2013 1403    LABSPEC 1.020 08/17/2013 1403    PHURINE 5.5 08/17/2013 1403  GLUCOSEU NEGATIVE 08/17/2013 1403    HGBUR NEGATIVE 08/17/2013 1403    BILIRUBINUR NEGATIVE 08/17/2013 1403    KETONESUR NEGATIVE 08/17/2013 1403    PROTEINUR NEGATIVE 08/17/2013 1403    UROBILINOGEN 0.2 08/17/2013 1403    NITRITE NEGATIVE 08/17/2013 1403    LEUKOCYTESUR NEGATIVE 08/17/2013 1403        Recent Labs  No results found for: "LABMICR", "WBCUA", "RBCUA", "LABEPIT", "MUCUS", "BACTERIA"     Pertinent Imaging:   No results found for this or any previous visit.   No results found for this or any previous visit.   No results found for this or any previous visit.   No results found for this or any previous visit.   No results found for this or any previous visit.   No valid procedures specified. No results found for this or any previous visit.   No results found for this or any previous visit.     Assessment & Plan:     1. Bilateral hydrocele We discussed the management of hydroceles including observation, aspiration and hydrocelectomy. After discussing the options the patient elects for bilateral hydrocelectomy. Risks/benefits/alternatives discussed - Urinalysis, Routine w reflex microscopic

## 2022-08-20 NOTE — Op Note (Signed)
Preoperative diagnosis: bilateral hydroceles  Postoperative diagnosis: same  Procedure: 1. Bilateral hydrocelectomy  Attending: Wilkie Aye, MD  Anesthesia: General  History of blood loss: Minimal  Antibiotics: ancef  Drains: none  Specimens: none  Findings: 10cm left hydrocele and 7cm right hydrocele  Indications: Patient is a 62 year old male with a history of bilateral hydroceles that were growing in size and causing him pain with walking.  We discussed the treatment options including observation versus excision after discussing treatment options he decided to proceed with bilateral hydrocelectomy   Procedure in detail: Prior to procedure consent was obtained.  Patient was brought to the operating room and a brief timeout was done to ensure correct patient, correct procedure, correct site.  General anesthesia was administered and patient was placed in supine position.  His genitalia was then prepped and draped in usual sterile fashion.  A 4 cm incision was made in the median raphe  We dissected down to the tunica and then incised the tunica over the left testis. We then incised the hydrocele sac and then over sewed the edge with 2-0 Vicryl in a running fashion. We then excised the left appendix testis. We then returned the testis to the left hemiscrotum and closed the overlying dartos with 3-0 vicryl in a running fashion. We then turned out attention to the right side. We dissected down to the tunica and then incised the tunica over the right testis. We then incised the hydrocele sac and then over sewed the edge with 2-0 Vicryl in a running fashion. We then excised the right appendix testis. We then returned the testis to the right hemiscrotum and closed the overlying dartos with 3-0 vicryl in a running fashion The skin was then closed with 4-0 monocryl in a running fashion. Dermabond was placed on the incision.  A dressing was then applied to the incision.  We then placed a scrotal  fluff and this then concluded the procedure which was well tolerated by the patient.  Complications: None  Condition: Stable, extubated, transferred to PACU.  Plan: Patient is to be discharged home.  He is to follow up in 2 weeks for wound check.

## 2022-08-22 NOTE — Anesthesia Postprocedure Evaluation (Signed)
Anesthesia Post Note  Patient: Calvin Henry  Procedure(s) Performed: HYDROCELECTOMY ADULT (Bilateral: Scrotum)  Patient location during evaluation: Phase II Anesthesia Type: General Level of consciousness: awake Pain management: pain level controlled Vital Signs Assessment: post-procedure vital signs reviewed and stable Respiratory status: spontaneous breathing and respiratory function stable Cardiovascular status: blood pressure returned to baseline and stable Postop Assessment: no headache and no apparent nausea or vomiting Anesthetic complications: no Comments: Late entry   No notable events documented.   Last Vitals:  Vitals:   08/20/22 1215 08/20/22 1224  BP:  138/79  Pulse: (!) 53 (!) 53  Resp: (!) 0   Temp:  (!) 36.3 C  SpO2: 99% 98%    Last Pain:  Vitals:   08/21/22 1448  TempSrc:   PainSc: 3                  Windell Norfolk

## 2022-08-26 ENCOUNTER — Encounter (HOSPITAL_COMMUNITY): Payer: Self-pay | Admitting: Urology

## 2022-08-27 DIAGNOSIS — Z1331 Encounter for screening for depression: Secondary | ICD-10-CM | POA: Diagnosis not present

## 2022-08-27 DIAGNOSIS — Z0001 Encounter for general adult medical examination with abnormal findings: Secondary | ICD-10-CM | POA: Diagnosis not present

## 2022-08-27 DIAGNOSIS — M1611 Unilateral primary osteoarthritis, right hip: Secondary | ICD-10-CM | POA: Diagnosis not present

## 2022-08-27 DIAGNOSIS — M1711 Unilateral primary osteoarthritis, right knee: Secondary | ICD-10-CM | POA: Diagnosis not present

## 2022-08-27 DIAGNOSIS — F1120 Opioid dependence, uncomplicated: Secondary | ICD-10-CM | POA: Diagnosis not present

## 2022-08-27 DIAGNOSIS — E782 Mixed hyperlipidemia: Secondary | ICD-10-CM | POA: Diagnosis not present

## 2022-08-27 DIAGNOSIS — N492 Inflammatory disorders of scrotum: Secondary | ICD-10-CM | POA: Diagnosis not present

## 2022-08-27 DIAGNOSIS — E6609 Other obesity due to excess calories: Secondary | ICD-10-CM | POA: Diagnosis not present

## 2022-08-27 DIAGNOSIS — N529 Male erectile dysfunction, unspecified: Secondary | ICD-10-CM | POA: Diagnosis not present

## 2022-08-27 DIAGNOSIS — Z6836 Body mass index (BMI) 36.0-36.9, adult: Secondary | ICD-10-CM | POA: Diagnosis not present

## 2022-08-27 DIAGNOSIS — I4819 Other persistent atrial fibrillation: Secondary | ICD-10-CM | POA: Diagnosis not present

## 2022-08-27 DIAGNOSIS — I10 Essential (primary) hypertension: Secondary | ICD-10-CM | POA: Diagnosis not present

## 2022-09-02 ENCOUNTER — Encounter: Payer: Medicare Other | Admitting: Urology

## 2022-09-02 ENCOUNTER — Telehealth: Payer: Self-pay

## 2022-09-02 DIAGNOSIS — N433 Hydrocele, unspecified: Secondary | ICD-10-CM

## 2022-09-02 NOTE — Telephone Encounter (Signed)
Return call to patient. Patient no show pre-op appointment 05/22 and states he was unaware of appointment. Patient states that he had hydrocele surgery, patient states that there is an odor, big hold that do not seem as if it is healing, along with blood, and some kind of fluid from the hydrocele incision. Patient is aware that a message will be sent to Dr. Ronne Binning and someone will reach out with the MD recommendation. Patient voiced understanding.

## 2022-09-03 NOTE — Telephone Encounter (Signed)
Return call to patient making him aware that he has an appointment scheduled to see Calvin Sago, NP for the hydrocele and appointment reminder mailed out to patient. Patient is aware to keep the incision clean and dry. Patient voiced understanding.

## 2022-09-10 NOTE — Progress Notes (Unsigned)
Diagnoses: Post-operative state  HPI: Calvin Henry presents postoperatively after undergoing bilateral hydrocelectomy procedure by Dr. Ronne Binning on 08/20/2022. Preoperatively he was experiencing progressive enlargement of his bilateral hydroceles along with pain with walking. Additional urologic history includes erectile dysfunction.  Postop course: Today He reports that the midline scrotal incision has opened. He reports scant yellow/brown drainage from that area. He denies redness, warmth, tenderness, or swelling at the surgical site(s). He denies fevers. He denies any urinary or bowel concerns.   Fall Screening: Do you usually have a device to assist in your mobility? No   Medications: Current Outpatient Medications  Medication Sig Dispense Refill   acetaminophen (TYLENOL) 500 MG tablet Take 1,000 mg by mouth every 6 (six) hours as needed for moderate pain.     allopurinol (ZYLOPRIM) 100 MG tablet Take 100 mg by mouth daily.     amLODipine (NORVASC) 5 MG tablet Take 5 mg by mouth daily.     apixaban (ELIQUIS) 5 MG TABS tablet Take 5 mg by mouth 2 (two) times daily.     atorvastatin (LIPITOR) 80 MG tablet Take 80 mg by mouth daily.     chlorthalidone (HYGROTON) 25 MG tablet Take 12.5 mg by mouth every Monday, Wednesday, and Friday.     Cholecalciferol (VITAMIN D3 SUPER STRENGTH) 50 MCG (2000 UT) CAPS Take 1 capsule by mouth daily.     diclofenac Sodium (VOLTAREN) 1 % GEL Apply 1 Application topically 4 (four) times daily as needed (pain).     furosemide (LASIX) 20 MG tablet Take 20 mg by mouth daily.     HYDROcodone-acetaminophen (NORCO) 5-325 MG tablet Take 1 tablet by mouth every 6 (six) hours as needed for moderate pain. 30 tablet 0   isosorbide mononitrate (IMDUR) 30 MG 24 hr tablet Take 30 mg by mouth daily.     metoprolol succinate (TOPROL-XL) 50 MG 24 hr tablet TAKE 1 TABLET BY MOUTH DAILY 90 tablet 3   olmesartan (BENICAR) 20 MG tablet Take 20 mg by mouth daily.      oxyCODONE-acetaminophen (PERCOCET) 10-325 MG tablet Take 1 tablet by mouth every 4 (four) hours as needed for pain.     polyethylene glycol powder (MIRALAX) 17 GM/SCOOP powder Take one capful twice daily until regular soft stool, then continue once daily. (Patient taking differently: Take 17 g by mouth daily as needed for moderate constipation.) 527 g 11   tadalafil (CIALIS) 20 MG tablet Take 1 tablet (20 mg total) by mouth daily as needed. (Patient not taking: Reported on 08/14/2022) 10 tablet 5   No current facility-administered medications for this visit.    Allergies: No Known Allergies  Past Medical History:  Diagnosis Date   A-fib (HCC)    Acid reflux    Arthritis    Atrial flutter (HCC)    a. diagnosed in 02/2017 with spontaneous conversion back to NSR.    CAD (coronary artery disease)    Chronic back pain    CKD (chronic kidney disease) stage 3, GFR 30-59 ml/min (HCC)    Essential hypertension    H. pylori infection    Treated with Prevpac   Hemorrhoids    PSVT (paroxysmal supraventricular tachycardia)    a. s/p ablation in 12/2016   Restrictive cardiomyopathy (HCC)    Tubular adenoma    Past Surgical History:  Procedure Laterality Date   COLONOSCOPY  08/07/10   friable anal canal otherwise normal   COLONOSCOPY [name] 03/18/2017   Procedure: COLONOSCOPY;  Surgeon: Eula Listen  M, MD;  [facility]: [facility];  Service: Endoscopy;  Laterality: [name];  1:15pm   COLONOSCOPY WITH ESOPHAGOGASTRODUODENOSCOPY (EGD) [name] 07/06/2013   Dr. Jena Gauss- inflamed hemorrhoids- multiple colonic polyps= tubular adenoma. stomach bx= hpylori treated with prevpac   ESOPHAGOGASTRODUODENOSCOPY  08/07/10   schatzkis ring otherwise normal, s/p 56-F dilation, small hiatal hernia.chronic active gastritis on bx   HEMORRHOID BANDING     HERNIA REPAIR     HYDROCELE EXCISION Bilateral 08/20/2022   Procedure: HYDROCELECTOMY ADULT;  Surgeon: Malen Gauze, MD;  Location: [facility];  Service: Urology;   Laterality: Bilateral;   SVT ABLATION [name] 12/15/2016   Procedure: SVT Ablation;  Surgeon: Hillis Range, MD;  [facility]: [facility];  Service: Cardiovascular;  Laterality: [name];   TOTAL HIP ARTHROPLASTY Left 10/04/2015   Procedure: LEFT TOTAL HIP ARTHROPLASTY ANTERIOR APPROACH;  Surgeon: Kathryne Hitch, MD;  Location: [facility];  Service: Orthopedics;  Laterality: Left;   UMBILICAL HERNIA REPAIR  08/05/2011   Procedure: HERNIA REPAIR UMBILICAL ADULT;  Surgeon: Dalia Heading, MD;  Location: [facility];  Service: General;  Laterality: [name];    Family History  Problem Relation Age of Onset   Diabetes Mother    Hypertension Mother    Colon cancer Neg Hx    Social History   Socioeconomic History   Marital status: Married    Spouse name: Not on file   Number of children: Not on file   Years of education: Not on file   Highest education level: Not on file  Occupational History   Not on file  Tobacco Use   Smoking status: Former    Packs/day: 0.50    Years: 10.00    Additional pack years: 0.00    Total pack years: 5.00    Types: Cigars, Cigarettes    Quit date: 12/01/1986    Years since quitting: 35.8   Smokeless tobacco: Never   Tobacco comments:    Quit x 20 plus years  Vaping Use   Vaping Use: Never used  Substance and Sexual Activity   Alcohol use: Not Currently    Comment: a beer 1-2 times a week   Drug use: No    Comment: remote marijuana use    Sexual activity: Yes  Other Topics Concern   Not on file  Social History Narrative   ** Merged History Encounter **       Lives in [place] Disabled but works as a Nurse, [employer] Strain: Not on Ship broker Insecurity: Not on file  Transportation Needs: Not on file  Physical Activity: Not on file  Stress: Not on file  Social Connections: Not on file  Intimate Partner Violence: Not on file     SUBJECTIVE  Review of Systems Constitutional: Patient denies any unintentional  weight loss or change in strength lntegumentary: Patient denies any rashes or pruritus Cardiovascular: Patient denies chest pain or syncope Respiratory: Patient denies shortness of breath Gastrointestinal: Patient denies nausea, vomiting, constipation, or diarrhea Musculoskeletal: Patient denies muscle cramps or weakness Neurologic: Patient denies convulsions or seizures Allergic/Immunologic: Patient denies recent allergic reaction(s) Hematologic/Lymphatic: Patient denies bleeding tendencies Endocrine: Patient denies heat/cold intolerance  GU: As per HPI.  OBJECTIVE Vitals:   09/14/22 1037  BP: (!) 107/58  Pulse: 88  Temp: 98.3 F (36.8 C)   There is no height or weight on file to calculate BMI.  Physical Examination  Constitutional: No obvious distress; patient is non-toxic  appearing  Cardiovascular: No visible lower extremity edema.  Respiratory: The patient does not have audible wheezing/stridor; respirations do not appear labored  Gastrointestinal: Abdomen non-distended Musculoskeletal: Normal ROM of UEs  Skin: No obvious rashes/open sores  Neurologic: CN 2-12 grossly intact Psychiatric: Answered questions appropriately with normal affect  Hematologic/Lymphatic/Immunologic: No obvious bruises or sites of spontaneous bleeding  GU: Scrotal midline incision is open superficially, closed at subcutaneous layer with granulation tissue present. No erythema, warmth, tenderness, drainage, edema, fluctuance, or crepitus.    ASSESSMENT Bilateral hydrocele  S/P repair of hydrocele  Postop check  Erectile dysfunction, unspecified erectile dysfunction type  We reviewed the operative procedures and findings. Pain is well controlled. Surgical site healing well overall; we discussed anticipated wound closure via secondary intention. He was advised to keep area clean and dry. Discussed recommendation for scrotal support.   Will plan for follow up in 2 weeks for recheck or sooner if  needed. Pt verbalized understanding and agreement. All questions were answered.  PLAN Advised the following: Keep wound clean and dry. Return in about 2 weeks (around 09/28/2022) for f/u with Evette Georges, NP.  No orders of the defined types were placed in this encounter.   It has been explained that the patient is to follow regularly with their PCP in addition to all other providers involved in their care and to follow instructions provided by these respective offices. Patient advised to contact urology clinic if any urologic-pertaining questions, concerns, new symptoms or problems arise in the interim period.  There are no Patient Instructions on file for this visit.  Electronically signed by:  Donnita Falls, MSN, FNP-C, CUNP 09/14/2022 11:33 AM

## 2022-09-11 DIAGNOSIS — I4819 Other persistent atrial fibrillation: Secondary | ICD-10-CM | POA: Diagnosis not present

## 2022-09-11 DIAGNOSIS — E782 Mixed hyperlipidemia: Secondary | ICD-10-CM | POA: Diagnosis not present

## 2022-09-11 DIAGNOSIS — I10 Essential (primary) hypertension: Secondary | ICD-10-CM | POA: Diagnosis not present

## 2022-09-13 DIAGNOSIS — N529 Male erectile dysfunction, unspecified: Secondary | ICD-10-CM | POA: Insufficient documentation

## 2022-09-14 ENCOUNTER — Ambulatory Visit (INDEPENDENT_AMBULATORY_CARE_PROVIDER_SITE_OTHER): Payer: Medicare Other | Admitting: Urology

## 2022-09-14 ENCOUNTER — Encounter: Payer: Self-pay | Admitting: Urology

## 2022-09-14 VITALS — BP 107/58 | HR 88 | Temp 98.3°F

## 2022-09-14 DIAGNOSIS — Z09 Encounter for follow-up examination after completed treatment for conditions other than malignant neoplasm: Secondary | ICD-10-CM

## 2022-09-14 DIAGNOSIS — N529 Male erectile dysfunction, unspecified: Secondary | ICD-10-CM

## 2022-09-14 DIAGNOSIS — N433 Hydrocele, unspecified: Secondary | ICD-10-CM

## 2022-09-14 DIAGNOSIS — Z87438 Personal history of other diseases of male genital organs: Secondary | ICD-10-CM

## 2022-09-15 ENCOUNTER — Ambulatory Visit: Payer: Medicare Other | Admitting: Urology

## 2022-09-15 DIAGNOSIS — I4819 Other persistent atrial fibrillation: Secondary | ICD-10-CM | POA: Diagnosis not present

## 2022-09-15 DIAGNOSIS — I10 Essential (primary) hypertension: Secondary | ICD-10-CM | POA: Diagnosis not present

## 2022-09-18 DIAGNOSIS — Z6836 Body mass index (BMI) 36.0-36.9, adult: Secondary | ICD-10-CM | POA: Diagnosis not present

## 2022-09-18 DIAGNOSIS — E6609 Other obesity due to excess calories: Secondary | ICD-10-CM | POA: Diagnosis not present

## 2022-09-18 DIAGNOSIS — M1611 Unilateral primary osteoarthritis, right hip: Secondary | ICD-10-CM | POA: Diagnosis not present

## 2022-09-18 DIAGNOSIS — F1120 Opioid dependence, uncomplicated: Secondary | ICD-10-CM | POA: Diagnosis not present

## 2022-09-18 DIAGNOSIS — M5136 Other intervertebral disc degeneration, lumbar region: Secondary | ICD-10-CM | POA: Diagnosis not present

## 2022-09-18 DIAGNOSIS — I4819 Other persistent atrial fibrillation: Secondary | ICD-10-CM | POA: Diagnosis not present

## 2022-09-18 DIAGNOSIS — M1711 Unilateral primary osteoarthritis, right knee: Secondary | ICD-10-CM | POA: Diagnosis not present

## 2022-09-30 ENCOUNTER — Encounter: Payer: Self-pay | Admitting: Gastroenterology

## 2022-09-30 ENCOUNTER — Ambulatory Visit: Payer: Medicare Other | Admitting: Gastroenterology

## 2022-09-30 VITALS — BP 111/70 | HR 64 | Temp 97.6°F | Ht 66.0 in | Wt 239.8 lb

## 2022-09-30 DIAGNOSIS — Z86010 Personal history of colonic polyps: Secondary | ICD-10-CM | POA: Diagnosis not present

## 2022-09-30 DIAGNOSIS — R1319 Other dysphagia: Secondary | ICD-10-CM | POA: Diagnosis not present

## 2022-09-30 DIAGNOSIS — K219 Gastro-esophageal reflux disease without esophagitis: Secondary | ICD-10-CM | POA: Diagnosis not present

## 2022-09-30 DIAGNOSIS — Z860101 Personal history of adenomatous and serrated colon polyps: Secondary | ICD-10-CM

## 2022-09-30 NOTE — Progress Notes (Signed)
History of Present Illness: Calvin Henry is a 62 y.o. male who presents today for follow up visit at [facility] [place]. - GU history: 1. Bilateral hydroceles. 2. Erectile dysfunction.  Recent history: - 08/20/2022: Underwent bilateral hydrocelectomy procedure by Dr. Ronne Binning.  - 09/14/2022: Postop check. Scrotal midline incision is open superficially, closed at subcutaneous layer with granulation tissue present.   Today:  Returns for wound check. He denies  scrotal redness, warmth, tenderness, or swelling at the surgical site(s). He denies fevers.   He denies increased urinary urgency, frequency, dysuria, gross hematuria, straining to void, or sensations of incomplete emptying.  He reports bother due to erectile dysfunction. Would like to restart oral medication for that. The plan at last office visit with Dr. Ronne Binning in January 2024 was to hold off until after his cardiac ablation for atrial fibrillation, which patient subsequently underwent in February 2024.    Medications: Current Outpatient Medications  Medication Sig Dispense Refill   acetaminophen (TYLENOL) 500 MG tablet Take 1,000 mg by mouth every 6 (six) hours as needed for moderate pain.     allopurinol (ZYLOPRIM) 100 MG tablet Take 100 mg by mouth daily.     amLODipine (NORVASC) 5 MG tablet Take 5 mg by mouth daily.     apixaban (ELIQUIS) 5 MG TABS tablet Take 5 mg by mouth 2 (two) times daily.     atorvastatin (LIPITOR) 80 MG tablet Take 80 mg by mouth daily.     chlorthalidone (HYGROTON) 25 MG tablet Take 12.5 mg by mouth every Monday, Wednesday, and Friday.     Cholecalciferol (VITAMIN D3 SUPER STRENGTH) 50 MCG (2000 UT) CAPS Take 1 capsule by mouth daily.     furosemide (LASIX) 20 MG tablet Take 20 mg by mouth daily.     HYDROcodone-acetaminophen (NORCO) 5-325 MG tablet Take 1 tablet by mouth every 6 (six) hours as needed for moderate pain. 30 tablet 0   isosorbide mononitrate (IMDUR) 30 MG 24 hr tablet  Take 30 mg by mouth daily.     metoprolol succinate (TOPROL-XL) 50 MG 24 hr tablet TAKE 1 TABLET BY MOUTH DAILY 90 tablet 3   olmesartan (BENICAR) 20 MG tablet Take 20 mg by mouth daily.     oxyCODONE-acetaminophen (PERCOCET) 10-325 MG tablet Take 1 tablet by mouth every 4 (four) hours as needed for pain.     No current facility-administered medications for this visit.    Allergies: No Known Allergies  Past Medical History:  Diagnosis Date   A-fib (HCC)    Acid reflux    Arthritis    Atrial flutter (HCC)    a. diagnosed in 02/2017 with spontaneous conversion back to NSR.    CAD (coronary artery disease)    Chronic back pain    CKD (chronic kidney disease) stage 3, GFR 30-59 ml/min (HCC)    Essential hypertension    H. pylori infection    Treated with Prevpac   Hemorrhoids    PSVT (paroxysmal supraventricular tachycardia)    a. s/p ablation in 12/2016   Restrictive cardiomyopathy (HCC)    Tubular adenoma    Past Surgical History:  Procedure Laterality Date   COLONOSCOPY  08/07/10   friable anal canal otherwise normal   COLONOSCOPY [name] 03/18/2017   Procedure: COLONOSCOPY;  Surgeon: Corbin Ade, MD;  [facility];  Service: Endoscopy;  Laterality: [name];  1:15pm   COLONOSCOPY WITH ESOPHAGOGASTRODUODENOSCOPY (EGD) [name] 07/06/2013   Dr. Jena Gauss- inflamed hemorrhoids- multiple colonic polyps=  tubular adenoma. stomach bx= hpylori treated with prevpac   ESOPHAGOGASTRODUODENOSCOPY  08/07/10   schatzkis ring otherwise normal, s/p 56-F dilation, small hiatal hernia.chronic active gastritis on bx   HEMORRHOID BANDING     HERNIA REPAIR     HYDROCELE EXCISION Bilateral 08/20/2022   Procedure: HYDROCELECTOMY ADULT;  Surgeon: Malen Gauze, MD;  Location: [facility];  Service: Urology;  Laterality: Bilateral;   SVT ABLATION [name] 12/15/2016   Procedure: SVT Ablation;  Surgeon: Hillis Range, MD;  [facility];  Service: Cardiovascular;  Laterality: [name];   TOTAL HIP  ARTHROPLASTY Left 10/04/2015   Procedure: LEFT TOTAL HIP ARTHROPLASTY ANTERIOR APPROACH;  Surgeon: Kathryne Hitch, MD;  Location: [facility];  Service: Orthopedics;  Laterality: Left;   UMBILICAL HERNIA REPAIR  08/05/2011   Procedure: HERNIA REPAIR UMBILICAL ADULT;  Surgeon: Dalia Heading, MD;  Location: [facility];  Service: General;  Laterality: [name];   Family History  Problem Relation Age of Onset   Diabetes Mother    Hypertension Mother    Colon cancer Neg Hx    Social History   Socioeconomic History   Marital status: Married    Spouse name: Not on file   Number of children: Not on file   Years of education: Not on file   Highest education level: Not on file  Occupational History   Not on file  Tobacco Use   Smoking status: Former    Packs/day: 0.50    Years: 10.00    Additional pack years: 0.00    Total pack years: 5.00    Types: Cigars, Cigarettes    Quit date: 12/01/1986    Years since quitting: 35.8   Smokeless tobacco: Never   Tobacco comments:    Quit x 20 plus years  Vaping Use   Vaping Use: Never used  Substance and Sexual Activity   Alcohol use: Not Currently    Comment: a beer 1-2 times a week   Drug use: No    Comment: remote marijuana use    Sexual activity: Yes  Other Topics Concern   Not on file  Social History Narrative   ** Merged History Encounter **       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, [employer] Strain: Not on Ship broker Insecurity: Not on file  Transportation Needs: Not on file  Physical Activity: Not on file  Stress: Not on file  Social Connections: Not on file  Intimate Partner Violence: Not on file    Review of Systems Constitutional: Patient denies any unintentional weight loss or change in strength lntegumentary: Patient denies any rashes or pruritus Eyes: Patient denies dry eyes ENT: Patient denies dry mouth Cardiovascular: Patient denies chest pain or  syncope Respiratory: Patient denies shortness of breath Gastrointestinal: Patient denies nausea, vomiting, constipation, or diarrhea Musculoskeletal: Patient denies muscle cramps or weakness Neurologic: Patient denies convulsions or seizures Psychiatric: Patient denies memory problems Allergic/Immunologic: Patient denies recent allergic reaction(s) Hematologic/Lymphatic: Patient denies bleeding tendencies Endocrine: Patient denies heat/cold intolerance  GU: As per HPI.  OBJECTIVE Vitals:   10/02/22 0826  BP: 108/67  Pulse: 67  Temp: 98 F (36.7 C)   There is no height or weight on file to calculate BMI.  Physical Examination  Constitutional: No obvious distress; patient is non-toxic appearing  Cardiovascular: No visible lower extremity edema.  Respiratory: The patient does not have audible wheezing/stridor; respirations do not appear labored  Gastrointestinal: Abdomen non-distended Musculoskeletal: Normal ROM of UEs  Neurologic: CN 2-12 grossly intact Psychiatric: Answered questions appropriately with normal affect  Hematologic/Lymphatic/Immunologic: No obvious bruises or sites of spontaneous bleeding  Genitourinary: Penis is normal in appearance.  Scrotum is normal in appearance with no edema or tenderness. Incision is well healed, well approximated and intact, dry, and without erythema, warmth, or drainage.    ASSESSMENT Bilateral hydrocele  Postop check  Erectile dysfunction, unspecified erectile dysfunction type - Plan: Testosterone,Free and Total, CBC   Well healed from hydrocelectomy; no acute concerns.   He requested to restart treatment for his erectile dysfunction.  - We reviewed options for therapy including medication, vacuum pumps, penile injections, MUSE, and penile implants. Patient was advised that not all these options are likely to be covered by insurance.  - No prior testosterone levels found per chart review; we discussed how low testosterone /  hypogonadism can contribute to erectile dysfunction - he agreed to check labs today.  - We discussed past cardiac history. Will contact his cardiologist seeking cardiac clearance prior to restarting oral PDE-5 inhibitors; patient verbalized understanding and agreement.  - If cleared by cardiology, he elects to restart Cialis 20 mg PRN and was advised he can use that along with a vacuum erection device. We reviewed how to use VED appropriately.  - We discussed potential side effects and he was advised to seek urgent medical attention if he develops an erection lasting more than 2-4 hours due to the risk for irreversible permanent tissue damage.   He elected to follow up in 3 months with Dr. Ronne Binning. May consider intracorporeal injections at that time if ED response is unsatisfactory. All questions were answered.  After today's visit, I spoke with Dr. Leavy Cella nurse Juliette Alcide (patient's cardiologist in [place]) who sent consult message to Dr. Sharrell Ku. Will plan to notify patient of response and proceed accordingly.   PLAN Advised the following: 1. Labs today (testosterone & CBC). 2. Awaiting cardiology clearance re: restarting erectile dysfunction oral medication. 3. Return in about 3 months (around 01/02/2023) for f/u with Dr. Ronne Binning for ED.  Orders Placed This Encounter  Procedures   Testosterone,Free and Total   CBC    It has been explained that the patient is to follow regularly with their PCP in addition to all other providers involved in their care and to follow instructions provided by these respective offices. Patient advised to contact urology clinic if any urologic-pertaining questions, concerns, new symptoms or problems arise in the interim period.  There are no Patient Instructions on file for this visit.  Electronically signed by:  Donnita Falls, FNP   10/02/22    9:05 AM

## 2022-09-30 NOTE — Progress Notes (Signed)
GI Office Note    Referring Provider: Avis Epley, PA* Primary Care Physician:  Ladon Applebaum  Primary Gastroenterologist: Roetta Sessions, MD   Chief Complaint   Chief Complaint  Patient presents with   Colonoscopy     History of Present Illness   Calvin Henry is a 62 y.o. male presenting today for follow up. Last seen in 05/2022.  History of persistent atrial fibrillation, CAD, stage 3 CKD, hypertension, restrictive cardiomyopathy followed at [facility] who presents to schedule 5 year surveillance colonoscopy. Patient's last colonoscopy was in December 2018.   Patient had been having a lot of other health issues when I last saw him. He was scheduled on February 13 for procedure for persistent A-fib. He is chronically on Eliquis. Having a lot of right knee pain, surgical intervention on hold because of cardiac issues. States he also has large bilateral hydroceles but prefers not to have any surgical intervention for this if he does not have to. Planned for EGD/ED/colonoscopy after completing cardiac evaluation. History of constipation, esophageal dysphagia, h/o adenomatous colon polyps.   Last seen by cardiology September 15, 2022, Dr. Sharrell Ku.  History of persistent A-fib status post ablation February 2024 and DCCV March 2024.  Recent ECG showed sinus rhythm.  Normal LVEF.  Cleared by cardiology for planned right TKA with okay to hold Eliquis 72 hours prior to procedure.  Today: takes omeprazole or TUMS prn. Still having heartburn, notes after drinking a lot of water. Was having daily solid food dysphagia a few months back, symptoms not as frequent now. No abdominal pain. Has noted right back pain. Has a lot of back issues. BM regular. No melena, brbpr.    Medications   Current Outpatient Medications  Medication Sig Dispense Refill   acetaminophen (TYLENOL) 500 MG tablet Take 1,000 mg by mouth every 6 (six) hours as needed for moderate pain.     allopurinol  (ZYLOPRIM) 100 MG tablet Take 100 mg by mouth daily.     amLODipine (NORVASC) 5 MG tablet Take 5 mg by mouth daily.     apixaban (ELIQUIS) 5 MG TABS tablet Take 5 mg by mouth 2 (two) times daily.     atorvastatin (LIPITOR) 80 MG tablet Take 80 mg by mouth daily.     chlorthalidone (HYGROTON) 25 MG tablet Take 12.5 mg by mouth every Monday, Wednesday, and Friday.     Cholecalciferol (VITAMIN D3 SUPER STRENGTH) 50 MCG (2000 UT) CAPS Take 1 capsule by mouth daily.     furosemide (LASIX) 20 MG tablet Take 20 mg by mouth daily.     HYDROcodone-acetaminophen (NORCO) 5-325 MG tablet Take 1 tablet by mouth every 6 (six) hours as needed for moderate pain. 30 tablet 0   isosorbide mononitrate (IMDUR) 30 MG 24 hr tablet Take 30 mg by mouth daily.     metoprolol succinate (TOPROL-XL) 50 MG 24 hr tablet TAKE 1 TABLET BY MOUTH DAILY 90 tablet 3   olmesartan (BENICAR) 20 MG tablet Take 20 mg by mouth daily.     oxyCODONE-acetaminophen (PERCOCET) 10-325 MG tablet Take 1 tablet by mouth every 4 (four) hours as needed for pain.     No current facility-administered medications for this visit.    Allergies   Allergies as of 09/30/2022   (No Known Allergies)    Past Medical History   Past Medical History:  Diagnosis Date   A-fib (HCC)    Acid reflux    Arthritis  Atrial flutter (HCC)    a. diagnosed in 02/2017 with spontaneous conversion back to NSR.    CAD (coronary artery disease)    Chronic back pain    CKD (chronic kidney disease) stage 3, GFR 30-59 ml/min (HCC)    Essential hypertension    H. pylori infection    Treated with Prevpac   Hemorrhoids    PSVT (paroxysmal supraventricular tachycardia)    a. s/p ablation in 12/2016   Restrictive cardiomyopathy (HCC)    Tubular adenoma     Past Surgical History   Past Surgical History:  Procedure Laterality Date   COLONOSCOPY  08/07/10   friable anal canal otherwise normal   COLONOSCOPY [name] 03/18/2017   Procedure: COLONOSCOPY;  Surgeon:  Corbin Ade, MD;  [facility];  Service: Endoscopy;  Laterality: [name];  1:15pm   COLONOSCOPY WITH ESOPHAGOGASTRODUODENOSCOPY (EGD) [name] 07/06/2013   Dr. Jena Gauss- inflamed hemorrhoids- multiple colonic polyps= tubular adenoma. stomach bx= hpylori treated with prevpac   ESOPHAGOGASTRODUODENOSCOPY  08/07/10   schatzkis ring otherwise normal, s/p 56-F dilation, small hiatal hernia.chronic active gastritis on bx   HEMORRHOID BANDING     HERNIA REPAIR     HYDROCELE EXCISION Bilateral 08/20/2022   Procedure: HYDROCELECTOMY ADULT;  Surgeon: Malen Gauze, MD;  Location: [facility];  Service: Urology;  Laterality: Bilateral;   SVT ABLATION [name] 12/15/2016   Procedure: SVT Ablation;  Surgeon: Hillis Range, MD;  [facility];  Service: Cardiovascular;  Laterality: [name];   TOTAL HIP ARTHROPLASTY Left 10/04/2015   Procedure: LEFT TOTAL HIP ARTHROPLASTY ANTERIOR APPROACH;  Surgeon: Kathryne Hitch, MD;  Location: [facility];  Service: Orthopedics;  Laterality: Left;   UMBILICAL HERNIA REPAIR  08/05/2011   Procedure: HERNIA REPAIR UMBILICAL ADULT;  Surgeon: Dalia Heading, MD;  Location: [facility];  Service: General;  Laterality: [name];    Past Family History   Family History  Problem Relation Age of Onset   Diabetes Mother    Hypertension Mother    Colon cancer Neg Hx     Past Social History   Social History   Socioeconomic History   Marital status: Married    Spouse name: Not on file   Number of children: Not on file   Years of education: Not on file   Highest education level: Not on file  Occupational History   Not on file  Tobacco Use   Smoking status: Former    Packs/day: 0.50    Years: 10.00    Additional pack years: 0.00    Total pack years: 5.00    Types: Cigars, Cigarettes    Quit date: 12/01/1986    Years since quitting: 35.8   Smokeless tobacco: Never   Tobacco comments:    Quit x 20 plus years  Vaping Use   Vaping Use: Never used  Substance and  Sexual Activity   Alcohol use: Not Currently    Comment: a beer 1-2 times a week   Drug use: No    Comment: remote marijuana use    Sexual activity: Yes  Other Topics Concern   Not on file  Social History Narrative   ** Merged History Encounter **       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 Eyes: Negative for vision changes.  ENT: Negative for hoarseness, difficulty swallowing , nasal congestion. CV: Negative for chest pain, angina, palpitations, dyspnea on exertion, peripheral edema.  Respiratory: Negative for dyspnea at rest, dyspnea on exertion, cough, sputum, wheezing.  GI: See history of present illness. GU:  Negative for dysuria, hematuria, urinary incontinence, urinary frequency, nocturnal urination.  MS: chronic knee pain, low back pain.  Derm: Negative for rash or itching.  Neuro: Negative for weakness, abnormal sensation, seizure, frequent headaches, memory loss,  confusion.  Psych: Negative for anxiety, depression, suicidal ideation, hallucinations.  Endo: Negative for unusual weight change.  Heme: Negative for bruising or bleeding. Allergy: Negative for rash or hives.  Physical Exam   BP 111/70 (BP Location: Right Arm, Patient Position: Sitting, Cuff Size: Large)   Pulse 64   Temp 97.6 F (36.4 C) (Oral)   Ht 5\' 6"  (1.676 m)   Wt 239 lb 12.8 oz (108.8 kg)   SpO2 97%   BMI 38.70 kg/m    General: Well-nourished, well-developed in no acute distress.  Head: Normocephalic, atraumatic.   Eyes: Conjunctiva pink, no icterus. Mouth: Oropharyngeal mucosa moist and pink   Neck: Supple  without thyromegaly, masses, or lymphadenopathy.  Lungs: Clear to auscultation bilaterally.  Heart: Regular rate and rhythm, no murmurs rubs or gallops.  Abdomen: Bowel sounds are normal, nontender, nondistended, no hepatosplenomegaly or masses,  no abdominal bruits or hernia, no rebound or guarding.  No CVA tenderness. Rectal: not performed Extremities: No lower extremity edema. No clubbing or deformities.  Neuro: Alert and oriented x 4 , grossly normal neurologically.  Skin: Warm and dry, no rash or jaundice.   Psych: Alert and cooperative, normal mood and affect.  Labs   Labs from March 2024: White blood cell count 4600, hemoglobin 15.1, MCV 85.6, platelets 311,000, sodium 143, potassium 4, creatinine 1.57   Imaging Studies   No results found.  Assessment   GERD/dysphagia: currently using antacids or omeprazole as needed. Intermittent heartburn, worse with drinking a lot of bottled water. May be drinking too much at one time and overeating. Handout of neutral bottle waters provided, avoid most acidic ones. He feels like his dysphagia is somewhat improved, not daily at this time. History of Schatzki ring in the past, still may benefit from EGD/ED.   H/O adenomatous colon polyps: due for surveillance exam.    PLAN   Colonoscopy/EGD/ED with Dr. Jena Gauss. ASA 3.  I have discussed the risks, alternatives, benefits with regards to but not limited to the risk of reaction to medication, bleeding, infection, perforation and the patient is agreeable to proceed. Written consent to be obtained. Hold Eliquis 48 hours before. He has already had cardiac clearance, see Care Everywhere.   Leanna Battles. Melvyn Neth, MHS, PA-C [facility]

## 2022-09-30 NOTE — Patient Instructions (Signed)
If you have heartburn more than 3-4 times per week, consider taking omeprazole daily. If you have infrequent heartburn, it is ok to use over the counter antacids as needed. Upper endoscopy and colonoscopy to be scheduled.  You will need to hold your Eliquis 48 hours before your procedure. This will be noted on your prep instructions as well.

## 2022-10-02 ENCOUNTER — Encounter: Payer: Self-pay | Admitting: Urology

## 2022-10-02 ENCOUNTER — Ambulatory Visit: Payer: Medicare Other | Admitting: Urology

## 2022-10-02 VITALS — BP 108/67 | HR 67 | Temp 98.0°F

## 2022-10-02 DIAGNOSIS — Z09 Encounter for follow-up examination after completed treatment for conditions other than malignant neoplasm: Secondary | ICD-10-CM

## 2022-10-02 DIAGNOSIS — Z87438 Personal history of other diseases of male genital organs: Secondary | ICD-10-CM

## 2022-10-02 DIAGNOSIS — N529 Male erectile dysfunction, unspecified: Secondary | ICD-10-CM | POA: Diagnosis not present

## 2022-10-02 DIAGNOSIS — N433 Hydrocele, unspecified: Secondary | ICD-10-CM

## 2022-10-05 LAB — CBC
Hematocrit: 42 % (ref 37.5–51.0)
Hemoglobin: 13.9 g/dL (ref 13.0–17.7)
MCH: 29.1 pg (ref 26.6–33.0)
MCHC: 33.1 g/dL (ref 31.5–35.7)
MCV: 88 fL (ref 79–97)
Platelets: 240 10*3/uL (ref 150–450)
RBC: 4.78 x10E6/uL (ref 4.14–5.80)
RDW: 17.2 % — ABNORMAL HIGH (ref 11.6–15.4)
WBC: 6 10*3/uL (ref 3.4–10.8)

## 2022-10-05 LAB — TESTOSTERONE,FREE AND TOTAL
Testosterone, Free: 6.3 pg/mL — ABNORMAL LOW (ref 6.6–18.1)
Testosterone: 245 ng/dL — ABNORMAL LOW (ref 264–916)

## 2022-10-05 NOTE — Progress Notes (Signed)
Please let pt know his testosterone level is low. This is likely a contributing factor for his erectile dysfunction and may explain why oral ED medications have not fully resolved the problem for him in the past. I recommend that he schedule an appointment in the near future with Dr. Ronne Binning to discuss treatment options for low testosterone / hypogonadism. May need cardiac clearance for that. Currently awaiting cardiac clearance from his cardiologist (Dr. Sharrell Ku) re: restart of oral PDE-5 inhibitors PRN (Viagra or Cialis).

## 2022-10-06 ENCOUNTER — Telehealth: Payer: Self-pay

## 2022-10-06 NOTE — Telephone Encounter (Signed)
-----   Message from [facility], LPN sent at 1/61/0960  8:15 AM EDT -----  ----- Message ----- From: Donnita Falls, FNP Sent: 10/05/2022   7:11 PM EDT To: [facility] [place] [facility]  Please let pt know his testosterone level is low. This is likely a contributing factor for his erectile dysfunction and may explain why oral ED medications have not fully resolved the problem for him in the past. I recommend that he schedule an appointment in the near future with Dr. Ronne Binning to discuss treatment options for low testosterone / hypogonadism. May need cardiac clearance for that. Currently awaiting cardiac clearance from his cardiologist (Dr. Sharrell Ku) re: restart of oral PDE-5 inhibitors PRN (Viagra or Cialis).

## 2022-10-06 NOTE — Telephone Encounter (Signed)
Melissa called back regarding pt cardio clearance.  Cardiologist will not clear patient to take ED medications at this time.  I requested his response be faxed, fax number provided.

## 2022-10-06 NOTE — Telephone Encounter (Signed)
I called patient and notified him of that ,Evette Georges FNP, called his Cardiologist and awaiting clearance for Viagra or Cialis.  He do have an appointment with Dr Ronne Binning on 12/23/22, and can discuss ED with him, but if needed earlier appointment, can call to make sure we have any available slot with Dr. Ronne Binning, if possible.  Patient stated that he understands and is awaiting clearance from his Cardiologist.

## 2022-10-22 DIAGNOSIS — R5383 Other fatigue: Secondary | ICD-10-CM | POA: Diagnosis not present

## 2022-10-22 DIAGNOSIS — I10 Essential (primary) hypertension: Secondary | ICD-10-CM | POA: Diagnosis not present

## 2022-10-22 DIAGNOSIS — G894 Chronic pain syndrome: Secondary | ICD-10-CM | POA: Diagnosis not present

## 2022-10-22 DIAGNOSIS — Z7901 Long term (current) use of anticoagulants: Secondary | ICD-10-CM | POA: Diagnosis not present

## 2022-10-22 DIAGNOSIS — F1120 Opioid dependence, uncomplicated: Secondary | ICD-10-CM | POA: Diagnosis not present

## 2022-10-22 DIAGNOSIS — Z6836 Body mass index (BMI) 36.0-36.9, adult: Secondary | ICD-10-CM | POA: Diagnosis not present

## 2022-10-22 DIAGNOSIS — E6609 Other obesity due to excess calories: Secondary | ICD-10-CM | POA: Diagnosis not present

## 2022-10-22 DIAGNOSIS — I4819 Other persistent atrial fibrillation: Secondary | ICD-10-CM | POA: Diagnosis not present

## 2022-10-22 DIAGNOSIS — E291 Testicular hypofunction: Secondary | ICD-10-CM | POA: Diagnosis not present

## 2022-10-28 ENCOUNTER — Telehealth: Payer: Self-pay | Admitting: *Deleted

## 2022-10-28 MED ORDER — PEG 3350-KCL-NA BICARB-NACL 420 G PO SOLR: 4000.0000 mL | Freq: Once | ORAL | 0 refills | Status: AC

## 2022-10-28 NOTE — Telephone Encounter (Signed)
Spoke with pt. He has been scheduled for TCS/EGD/ED with Dr. Jena Gauss asa 3 8/15. Aware will send instructions/pre-op appt. Aware to hold eliquis 48 hr sprior. Will send rx for prep to [facility].

## 2022-10-28 NOTE — Telephone Encounter (Signed)
Called pt and he has been made aware of his pre-op appt details. He voiced understanding.

## 2022-11-06 DIAGNOSIS — G4730 Sleep apnea, unspecified: Secondary | ICD-10-CM | POA: Diagnosis not present

## 2022-11-18 NOTE — Telephone Encounter (Signed)
[employer] PA: Approved Authorization #[id number]  Tracking #[id number] Dates of service 11/26/2022 - 02/25/2023

## 2022-11-19 DIAGNOSIS — E6609 Other obesity due to excess calories: Secondary | ICD-10-CM | POA: Diagnosis not present

## 2022-11-19 DIAGNOSIS — R5383 Other fatigue: Secondary | ICD-10-CM | POA: Diagnosis not present

## 2022-11-19 DIAGNOSIS — R7989 Other specified abnormal findings of blood chemistry: Secondary | ICD-10-CM | POA: Diagnosis not present

## 2022-11-19 DIAGNOSIS — E291 Testicular hypofunction: Secondary | ICD-10-CM | POA: Diagnosis not present

## 2022-11-19 DIAGNOSIS — N529 Male erectile dysfunction, unspecified: Secondary | ICD-10-CM | POA: Diagnosis not present

## 2022-11-19 DIAGNOSIS — I10 Essential (primary) hypertension: Secondary | ICD-10-CM | POA: Diagnosis not present

## 2022-11-19 DIAGNOSIS — Z6836 Body mass index (BMI) 36.0-36.9, adult: Secondary | ICD-10-CM | POA: Diagnosis not present

## 2022-11-19 NOTE — Patient Instructions (Signed)
Calvin Henry  11/19/2022     @PREFPERIOPPHARMACY @   Your procedure is scheduled on  11/26/2022.   Report to [name] at  0800 A.M.   Call this number if you have problems the morning of surgery:  [phone]  If you experience any cold or flu symptoms such as cough, fever, chills, shortness of breath, etc. between now and your scheduled surgery, please notify us at the above number.   Remember:  Follow the diet and prep instructions given to you by the office.     Your last dose of eliquis should be on 11/23/2022.     Take these medicines the morning of surgery with A SIP OF WATER          allopurinol, amlodipine, hydrocodone(if needed), isosorbide, oxycodone(if needed).     Do not wear jewelry, make-up or nail polish, including gel polish,  artificial nails, or any other type of covering on natural nails (fingers and  toes).  Do not wear lotions, powders, or perfumes, or deodorant.  Do not shave 48 hours prior to surgery.  Men may shave face and neck.  Do not bring valuables to the hospital.  [facility] is not responsible for any belongings or valuables.  Contacts, dentures or bridgework may not be worn into surgery.  Leave your suitcase in the car.  After surgery it may be brought to your room.  For patients admitted to the hospital, discharge time will be determined by your treatment team.  Patients discharged the day of surgery will not be allowed to drive home and must have someone with them for 24 hours.    Special instructions:   DO NOT smoke tobacco or vape for 24 hours before your procedure.  Please read over the following fact sheets that you were given. Anesthesia Post-op Instructions and Care and Recovery After Surgery      Upper Endoscopy, Adult, Care After After the procedure, it is common to have a sore throat. It is also common to have: Mild stomach pain or discomfort. Bloating. Nausea. Follow these instructions at home: The  instructions below may help you care for yourself at home. Your health care provider may give you more instructions. If you have questions, ask your health care provider. If you were given a sedative during the procedure, it can affect you for several hours. Do not drive or operate machinery until your health care provider says that it is safe. If you will be going home right after the procedure, plan to have a responsible adult: Take you home from the hospital or clinic. You will not be allowed to drive. Care for you for the time you are told. Follow instructions from your health care provider about what you may eat and drink. Return to your normal activities as told by your health care provider. Ask your health care provider what activities are safe for you. Take over-the-counter and prescription medicines only as told by your health care provider. Contact a health care provider if you: Have a sore throat that lasts longer than one day. Have trouble swallowing. Have a fever. Get help right away if you: Vomit blood or your vomit looks like coffee grounds. Have bloody, black, or tarry stools. Have a very bad sore throat or you cannot swallow. Have difficulty breathing or very bad pain in your chest or abdomen. These symptoms may be an emergency. Get help right away. Call 911. Do not wait to see  if the symptoms will go away. Do not drive yourself to the hospital. Summary After the procedure, it is common to have a sore throat, mild stomach discomfort, bloating, and nausea. If you were given a sedative during the procedure, it can affect you for several hours. Do not drive until your health care provider says that it is safe. Follow instructions from your health care provider about what you may eat and drink. Return to your normal activities as told by your health care provider. This information is not intended to replace advice given to you by your health care provider. Make sure you discuss  any questions you have with your health care provider. Document Revised: 07/09/2021 Document Reviewed: 07/09/2021 Elsevier Patient Education  2024 [employer] Esophageal Dilatation Esophageal dilatation, also called esophageal dilation, is a procedure to widen or open a blocked or narrowed part of the esophagus. The esophagus is the part of the body that moves food and liquid from the mouth to the stomach. You may need this procedure if: You have a buildup of scar tissue in your esophagus that makes it difficult, painful, or impossible to swallow. This can be caused by gastroesophageal reflux disease (GERD). You have cancer of the esophagus. There is a problem with how food moves through your esophagus. In some cases, you may need this procedure repeated at a later time to dilate the esophagus gradually. Tell a health care provider about: Any allergies you have. All medicines you are taking, including vitamins, herbs, eye drops, creams, and over-the-counter medicines. Any problems you or family members have had with anesthetic medicines. Any blood disorders you have. Any surgeries you have had. Any medical conditions you have. Any antibiotic medicines you are required to take before dental procedures. Whether you are pregnant or may be pregnant. What are the risks? Generally, this is a safe procedure. However, problems may occur, including: Bleeding due to a tear in the lining of the esophagus. A hole, or perforation, in the esophagus. What happens before the procedure? Ask your health care provider about: Changing or stopping your regular medicines. This is especially important if you are taking diabetes medicines or blood thinners. Taking medicines such as aspirin and ibuprofen. These medicines can thin your blood. Do not take these medicines unless your health care provider tells you to take them. Taking over-the-counter medicines, vitamins, herbs, and supplements. Follow  instructions from your health care provider about eating or drinking restrictions. Plan to have a responsible adult take you home from the hospital or clinic. Plan to have a responsible adult care for you for the time you are told after you leave the hospital or clinic. This is important. What happens during the procedure? You may be given a medicine to help you relax (sedative). A numbing medicine may be sprayed into the back of your throat, or you may gargle the medicine. Your health care provider may perform the dilatation using various surgical instruments, such as: Simple dilators. This instrument is carefully placed in the esophagus to stretch it. Guided wire bougies. This involves using an endoscope to insert a wire into the esophagus. A dilator is passed over this wire to enlarge the esophagus. Then the wire is removed. Balloon dilators. An endoscope with a small balloon is inserted into the esophagus. The balloon is inflated to stretch the esophagus and open it up. The procedure may vary among health care providers and hospitals. What can I expect after the procedure? Your blood pressure, heart rate, breathing rate,  and blood oxygen level will be monitored until you leave the hospital or clinic. Your throat may feel slightly sore and numb. This will get better over time. You will not be allowed to eat or drink until your throat is no longer numb. When you are able to drink, urinate, and sit on the edge of the bed without nausea or dizziness, you may be able to return home. Follow these instructions at home: Take over-the-counter and prescription medicines only as told by your health care provider. If you were given a sedative during the procedure, it can affect you for several hours. Do not drive or operate machinery until your health care provider says that it is safe. Plan to have a responsible adult care for you for the time you are told. This is important. Follow instructions from  your health care provider about any eating or drinking restrictions. Do not use any products that contain nicotine or tobacco, such as cigarettes, e-cigarettes, and chewing tobacco. If you need help quitting, ask your health care provider. Keep all follow-up visits. This is important. Contact a health care provider if: You have a fever. You have pain that is not relieved by medicine. Get help right away if: You have chest pain. You have trouble breathing. You have trouble swallowing. You vomit blood. You have black, tarry, or bloody stools. These symptoms may represent a serious problem that is an emergency. Do not wait to see if the symptoms will go away. Get medical help right away. Call your local emergency services (911 in the [place] Do not drive yourself to the hospital. Summary Esophageal dilatation, also called esophageal dilation, is a procedure to widen or open a blocked or narrowed part of the esophagus. Plan to have a responsible adult take you home from the hospital or clinic. For this procedure, a numbing medicine may be sprayed into the back of your throat, or you may gargle the medicine. Do not drive or operate machinery until your health care provider says that it is safe. This information is not intended to replace advice given to you by your health care provider. Make sure you discuss any questions you have with your health care provider. Document Revised: 08/16/2019 Document Reviewed: 08/16/2019 Elsevier Patient Education  2024 [employer] Colonoscopy, Adult, Care After The following information offers guidance on how to care for yourself after your procedure. Your health care provider may also give you more specific instructions. If you have problems or questions, contact your health care provider. What can I expect after the procedure? After the procedure, it is common to have: A small amount of blood in your stool for 24 hours after the procedure. Some gas. Mild  cramping or bloating of your abdomen. Follow these instructions at home: Eating and drinking  Drink enough fluid to keep your urine pale yellow. Follow instructions from your health care provider about eating or drinking restrictions. Resume your normal diet as told by your health care provider. Avoid heavy or fried foods that are hard to digest. Activity Rest as told by your health care provider. Avoid sitting for a long time without moving. Get up to take short walks every 1-2 hours. This is important to improve blood flow and breathing. Ask for help if you feel weak or unsteady. Return to your normal activities as told by your health care provider. Ask your health care provider what activities are safe for you. Managing cramping and bloating  Try walking around when you have cramps or  feel bloated. If directed, apply heat to your abdomen as told by your health care provider. Use the heat source that your health care provider recommends, such as a moist heat pack or a heating pad. Place a towel between your skin and the heat source. Leave the heat on for 20-30 minutes. Remove the heat if your skin turns bright red. This is especially important if you are unable to feel pain, heat, or cold. You have a greater risk of getting burned. General instructions If you were given a sedative during the procedure, it can affect you for several hours. Do not drive or operate machinery until your health care provider says that it is safe. For the first 24 hours after the procedure: Do not sign important documents. Do not drink alcohol. Do your regular daily activities at a slower pace than normal. Eat soft foods that are easy to digest. Take over-the-counter and prescription medicines only as told by your health care provider. Keep all follow-up visits. This is important. Contact a health care provider if: You have blood in your stool 2-3 days after the procedure. Get help right away if: You have  more than a small spotting of blood in your stool. You have large blood clots in your stool. You have swelling of your abdomen. You have nausea or vomiting. You have a fever. You have increasing pain in your abdomen that is not relieved with medicine. These symptoms may be an emergency. Get help right away. Call 911. Do not wait to see if the symptoms will go away. Do not drive yourself to the hospital. Summary After the procedure, it is common to have a small amount of blood in your stool. You may also have mild cramping and bloating of your abdomen. If you were given a sedative during the procedure, it can affect you for several hours. Do not drive or operate machinery until your health care provider says that it is safe. Get help right away if you have a lot of blood in your stool, nausea or vomiting, a fever, or increased pain in your abdomen. This information is not intended to replace advice given to you by your health care provider. Make sure you discuss any questions you have with your health care provider. Document Revised: 05/12/2022 Document Reviewed: 11/20/2020 Elsevier Patient Education  2024 [employer] Inc. Monitored Anesthesia Care, Care After The following information offers guidance on how to care for yourself after your procedure. Your health care provider may also give you more specific instructions. If you have problems or questions, contact your health care provider. What can I expect after the procedure? After the procedure, it is common to have: Tiredness. Little or no memory about what happened during or after the procedure. Impaired judgment when it comes to making decisions. Nausea or vomiting. Some trouble with balance. Follow these instructions at home: For the time period you were told by your health care provider:  Rest. Do not participate in activities where you could fall or become injured. Do not drive or use machinery. Do not drink alcohol. Do not take  sleeping pills or medicines that cause drowsiness. Do not make important decisions or sign legal documents. Do not take care of children on your own. Medicines Take over-the-counter and prescription medicines only as told by your health care provider. If you were prescribed antibiotics, take them as told by your health care provider. Do not stop using the antibiotic even if you start to feel better. Eating and drinking  Follow instructions from your health care provider about what you may eat and drink. Drink enough fluid to keep your urine pale yellow. If you vomit: Drink clear fluids slowly and in small amounts as you are able. Clear fluids include water, ice chips, low-calorie sports drinks, and fruit juice that has water added to it (diluted fruit juice). Eat light and bland foods in small amounts as you are able. These foods include bananas, applesauce, rice, lean meats, toast, and crackers. General instructions  Have a responsible adult stay with you for the time you are told. It is important to have someone help care for you until you are awake and alert. If you have sleep apnea, surgery and some medicines can increase your risk for breathing problems. Follow instructions from your health care provider about wearing your sleep device: When you are sleeping. This includes during daytime naps. While taking prescription pain medicines, sleeping medicines, or medicines that make you drowsy. Do not use any products that contain nicotine or tobacco. These products include cigarettes, chewing tobacco, and vaping devices, such as e-cigarettes. If you need help quitting, ask your health care provider. Contact a health care provider if: You feel nauseous or vomit every time you eat or drink. You feel light-headed. You are still sleepy or having trouble with balance after 24 hours. You get a rash. You have a fever. You have redness or swelling around the IV site. Get help right away if: You  have trouble breathing. You have new confusion after you get home. These symptoms may be an emergency. Get help right away. Call 911. Do not wait to see if the symptoms will go away. Do not drive yourself to the hospital. This information is not intended to replace advice given to you by your health care provider. Make sure you discuss any questions you have with your health care provider. Document Revised: 08/25/2021 Document Reviewed: 08/25/2021 Elsevier Patient Education  2024 [employer].

## 2022-11-24 ENCOUNTER — Encounter (HOSPITAL_COMMUNITY)
Admission: RE | Admit: 2022-11-24 | Discharge: 2022-11-24 | Disposition: A | Discharge status: Home / Self Care | Payer: Medicare HMO | Source: Ambulatory Visit | Attending: Internal Medicine | Admitting: Internal Medicine

## 2022-11-24 VITALS — BP 125/66 | HR 59 | Temp 98.5°F | Resp 18 | Ht 66.0 in | Wt 239.9 lb

## 2022-11-24 DIAGNOSIS — Z01810 Encounter for preprocedural cardiovascular examination: Secondary | ICD-10-CM | POA: Diagnosis not present

## 2022-11-24 DIAGNOSIS — Z01818 Encounter for other preprocedural examination: Secondary | ICD-10-CM | POA: Diagnosis present

## 2022-11-24 DIAGNOSIS — I10 Essential (primary) hypertension: Secondary | ICD-10-CM

## 2022-11-24 DIAGNOSIS — Z01812 Encounter for preprocedural laboratory examination: Secondary | ICD-10-CM | POA: Diagnosis not present

## 2022-11-24 DIAGNOSIS — Z79899 Other long term (current) drug therapy: Secondary | ICD-10-CM | POA: Diagnosis not present

## 2022-11-24 LAB — BASIC METABOLIC PANEL
Anion gap: 8 (ref 5–15)
BUN: 21 mg/dL (ref 8–23)
CO2: 23 mmol/L (ref 22–32)
Calcium: 8.9 mg/dL (ref 8.9–10.3)
Chloride: 106 mmol/L (ref 98–111)
Creatinine, Ser: 1.47 mg/dL — ABNORMAL HIGH (ref 0.61–1.24)
GFR, Estimated: 54 mL/min — ABNORMAL LOW (ref >60)
Glucose, Bld: 100 mg/dL — ABNORMAL HIGH (ref 70–99)
Potassium: 4.2 mmol/L (ref 3.5–5.1)
Sodium: 137 mmol/L (ref 135–145)

## 2022-11-25 ENCOUNTER — Telehealth: Payer: Self-pay

## 2022-11-25 NOTE — Telephone Encounter (Signed)
Patient called and made aware.

## 2022-11-25 NOTE — Telephone Encounter (Signed)
Patient states that he has sore areas in his testicles. He states it feels like something is sticking him and it is uncomfortable when he lays down with closed legs.  Patient denies area being swollen or warm to touch. Patient scheduled to f/u with MD on 12/23/22 Please advise on any recommendations.

## 2022-11-26 ENCOUNTER — Encounter (HOSPITAL_COMMUNITY)
Admission: RE | Disposition: A | Discharge status: Home / Self Care | Payer: Self-pay | Source: Home / Self Care | Attending: Internal Medicine

## 2022-11-26 ENCOUNTER — Ambulatory Visit (HOSPITAL_BASED_OUTPATIENT_CLINIC_OR_DEPARTMENT_OTHER): Payer: Medicare HMO | Admitting: Anesthesiology

## 2022-11-26 ENCOUNTER — Encounter (HOSPITAL_COMMUNITY): Payer: Self-pay | Admitting: Internal Medicine

## 2022-11-26 ENCOUNTER — Telehealth: Payer: Self-pay

## 2022-11-26 ENCOUNTER — Ambulatory Visit (HOSPITAL_COMMUNITY): Payer: Medicare HMO | Admitting: Anesthesiology

## 2022-11-26 ENCOUNTER — Ambulatory Visit (HOSPITAL_COMMUNITY)
Admission: RE | Admit: 2022-11-26 | Discharge: 2022-11-26 | Disposition: A | Discharge status: Home / Self Care | Payer: Medicare HMO | Attending: Internal Medicine | Admitting: Internal Medicine

## 2022-11-26 DIAGNOSIS — I2510 Atherosclerotic heart disease of native coronary artery without angina pectoris: Secondary | ICD-10-CM | POA: Diagnosis not present

## 2022-11-26 DIAGNOSIS — K3189 Other diseases of stomach and duodenum: Secondary | ICD-10-CM

## 2022-11-26 DIAGNOSIS — I129 Hypertensive chronic kidney disease with stage 1 through stage 4 chronic kidney disease, or unspecified chronic kidney disease: Secondary | ICD-10-CM | POA: Diagnosis not present

## 2022-11-26 DIAGNOSIS — I471 Supraventricular tachycardia, unspecified: Secondary | ICD-10-CM | POA: Diagnosis not present

## 2022-11-26 DIAGNOSIS — M1990 Unspecified osteoarthritis, unspecified site: Secondary | ICD-10-CM | POA: Diagnosis not present

## 2022-11-26 DIAGNOSIS — I25119 Atherosclerotic heart disease of native coronary artery with unspecified angina pectoris: Secondary | ICD-10-CM | POA: Diagnosis not present

## 2022-11-26 DIAGNOSIS — D120 Benign neoplasm of cecum: Secondary | ICD-10-CM | POA: Insufficient documentation

## 2022-11-26 DIAGNOSIS — Z86010 Personal history of colonic polyps: Secondary | ICD-10-CM

## 2022-11-26 DIAGNOSIS — Z09 Encounter for follow-up examination after completed treatment for conditions other than malignant neoplasm: Secondary | ICD-10-CM | POA: Diagnosis not present

## 2022-11-26 DIAGNOSIS — K449 Diaphragmatic hernia without obstruction or gangrene: Secondary | ICD-10-CM | POA: Diagnosis not present

## 2022-11-26 DIAGNOSIS — R1310 Dysphagia, unspecified: Secondary | ICD-10-CM | POA: Insufficient documentation

## 2022-11-26 DIAGNOSIS — K222 Esophageal obstruction: Secondary | ICD-10-CM | POA: Insufficient documentation

## 2022-11-26 DIAGNOSIS — I4891 Unspecified atrial fibrillation: Secondary | ICD-10-CM | POA: Diagnosis not present

## 2022-11-26 DIAGNOSIS — K219 Gastro-esophageal reflux disease without esophagitis: Secondary | ICD-10-CM | POA: Diagnosis not present

## 2022-11-26 DIAGNOSIS — Z87891 Personal history of nicotine dependence: Secondary | ICD-10-CM | POA: Insufficient documentation

## 2022-11-26 DIAGNOSIS — I10 Essential (primary) hypertension: Secondary | ICD-10-CM

## 2022-11-26 DIAGNOSIS — K635 Polyp of colon: Secondary | ICD-10-CM | POA: Diagnosis not present

## 2022-11-26 DIAGNOSIS — N183 Chronic kidney disease, stage 3 unspecified: Secondary | ICD-10-CM | POA: Diagnosis not present

## 2022-11-26 DIAGNOSIS — K5730 Diverticulosis of large intestine without perforation or abscess without bleeding: Secondary | ICD-10-CM | POA: Diagnosis not present

## 2022-11-26 DIAGNOSIS — K2950 Unspecified chronic gastritis without bleeding: Secondary | ICD-10-CM | POA: Diagnosis not present

## 2022-11-26 DIAGNOSIS — Z1211 Encounter for screening for malignant neoplasm of colon: Secondary | ICD-10-CM | POA: Insufficient documentation

## 2022-11-26 DIAGNOSIS — R1319 Other dysphagia: Secondary | ICD-10-CM

## 2022-11-26 HISTORY — PX: BIOPSY: SHX5522

## 2022-11-26 HISTORY — PX: COLONOSCOPY WITH PROPOFOL: SHX5780

## 2022-11-26 HISTORY — PX: POLYPECTOMY: SHX5525

## 2022-11-26 HISTORY — PX: MALONEY DILATION: SHX5535

## 2022-11-26 HISTORY — PX: ESOPHAGOGASTRODUODENOSCOPY (EGD) WITH PROPOFOL: SHX5813

## 2022-11-26 SURGERY — COLONOSCOPY WITH PROPOFOL
Anesthesia: General

## 2022-11-26 MED ORDER — PHENYLEPHRINE 80 MCG/ML (10ML) SYRINGE FOR IV PUSH (FOR BLOOD PRESSURE SUPPORT)
PREFILLED_SYRINGE | INTRAVENOUS | Status: DC | PRN
Start: 2022-11-26 — End: 2022-11-26
  Administered 2022-11-26 (×3): 160 ug via INTRAVENOUS

## 2022-11-26 MED ORDER — PROPOFOL 10 MG/ML IV BOLUS
INTRAVENOUS | Status: DC | PRN
Start: 2022-11-26 — End: 2022-11-26
  Administered 2022-11-26: 100 mg via INTRAVENOUS
  Administered 2022-11-26: 50 mg via INTRAVENOUS

## 2022-11-26 MED ORDER — LIDOCAINE HCL (CARDIAC) PF 100 MG/5ML IV SOSY
PREFILLED_SYRINGE | INTRAVENOUS | Status: DC | PRN
  Administered 2022-11-26: 50 mg via INTRAVENOUS

## 2022-11-26 MED ORDER — DEXMEDETOMIDINE HCL IN NACL 80 MCG/20ML IV SOLN
INTRAVENOUS | Status: AC
  Filled 2022-11-26: qty 20

## 2022-11-26 MED ORDER — PANTOPRAZOLE SODIUM 40 MG PO TBEC: 40.0000 mg | DELAYED_RELEASE_TABLET | Freq: Every day | ORAL | 11 refills | Status: DC

## 2022-11-26 MED ORDER — DEXMEDETOMIDINE HCL IN NACL 80 MCG/20ML IV SOLN
INTRAVENOUS | Status: DC | PRN
  Administered 2022-11-26: 8 ug via INTRAVENOUS

## 2022-11-26 MED ORDER — PROPOFOL 500 MG/50ML IV EMUL
INTRAVENOUS | Status: DC | PRN
  Administered 2022-11-26: 150 ug/kg/min via INTRAVENOUS

## 2022-11-26 MED ORDER — LACTATED RINGERS IV SOLN: INTRAVENOUS | Status: DC

## 2022-11-26 MED ORDER — PHENYLEPHRINE 80 MCG/ML (10ML) SYRINGE FOR IV PUSH (FOR BLOOD PRESSURE SUPPORT)
PREFILLED_SYRINGE | INTRAVENOUS | Status: AC
  Filled 2022-11-26: qty 10

## 2022-11-26 NOTE — Op Note (Signed)
[facility] Patient Name: Calvin Henry Procedure Date: 11/26/2022 9:37 AM MRN: [id number] Date of Birth: Jan 26, 1961 Attending MD: Gennette Pac , MD, [id number] CSN: [id number] Age: 62 Admit Type: Outpatient Procedure:                Upper GI endoscopy Indications:              Dysphagia Providers:                Gennette Pac, MD, Tammy Vaught, RN,                            Judeth Cornfield. Jessee Avers, Technician Referring MD:              Medicines:                Propofol per Anesthesia Complications:            No immediate complications. Estimated Blood Loss:     Estimated blood loss was minimal. Estimated blood                            loss was minimal. Procedure:                Pre-Anesthesia Assessment:                           - Prior to the procedure, a History and Physical                            was performed, and patient medications and                            allergies were reviewed. The patient's tolerance of                            previous anesthesia was also reviewed. The risks                            and benefits of the procedure and the sedation                            options and risks were discussed with the patient.                            All questions were answered, and informed consent                            was obtained. Prior Anticoagulants: The patient has                            taken no anticoagulant or antiplatelet agents and                            last took Eliquis (apixaban) 3 days prior to the  procedure. ASA Grade Assessment: III - A patient                            with severe systemic disease. After reviewing the                            risks and benefits, the patient was deemed in                            satisfactory condition to undergo the procedure.                           After obtaining informed consent, the endoscope was                            passed  under direct vision. Throughout the                            procedure, the patient's blood pressure, pulse, and                            oxygen saturations were monitored continuously. The                            [id number]-H190 ([id number]) scope was introduced through the                            mouth, and advanced to the second part of duodenum.                            The upper GI endoscopy was accomplished without                            difficulty. The patient tolerated the procedure                            well. Scope In: 10:09:56 AM Scope Out: 10:17:38 AM Total Procedure Duration: 0 hours 7 minutes 42 seconds  Findings:      A mild Schatzki ring was found at the gastroesophageal junction. Tubular       esophagus otherwise appeared normal.Gastric cavity empty The scope was       withdrawn.      A small hiatal hernia was present. Mild snake skinning of the gastric       body mucosa. No ulcer or infiltrating process. Patent pyloric channel       examination the bulb second portion revealed multiple bulbar erosions       only..Dilation was performed with a Maloney dilator with mild resistance       at 56 Fr. The dilation site was examined following endoscope reinsertion       and showed no change. Estimated blood loss was minimal. Finally,       biopsies the antrum and body were taken for histologic study. Impression:               - Mild Schatzki ring. Dilated.                           -  Small hiatal hernia. Abnormal gastric mucosa.                            Bulbar erosions. Status post gastric biopsy                           - Moderate Sedation:      Moderate (conscious) sedation was personally administered by an       anesthesia professional. The following parameters were monitored: oxygen       saturation, heart rate, blood pressure, respiratory rate, EKG, adequacy       of pulmonary ventilation, and response to care. Recommendation:           - Patient has a  contact number available for                            emergencies. The signs and symptoms of potential                            delayed complications were discussed with the                            patient. Return to normal activities tomorrow.                            Written discharge instructions were provided to the                            patient.                           - Advance diet as tolerated. begin Protonix 40 mg                            daily 30 minutes before breakfast.                           - Continue present medications. Follow-up on                            pathology. See colonoscopy report.                           - Return to my office in 3 Months. Procedure Code(s):        --- Professional ---                           [phone], Esophagogastroduodenoscopy, flexible,                            transoral; diagnostic, including collection of                            specimen(s) by brushing or washing, when performed                            (  separate procedure)                           43450, Dilation of esophagus, by unguided sound or                            bougie, single or multiple passes Diagnosis Code(s):        --- Professional ---                           K22.2, Esophageal obstruction                           K44.9, Diaphragmatic hernia without obstruction or                            gangrene                           R13.10, Dysphagia, unspecified CPT copyright 2022 American Medical Association. All rights reserved. The codes documented in this report are preliminary and upon coder review may  be revised to meet current compliance requirements. [name]. , MD Gennette Pac, MD 11/26/2022 10:39:45 AM This report has been signed electronically. Number of Addenda: 0

## 2022-11-26 NOTE — Anesthesia Postprocedure Evaluation (Signed)
Anesthesia Post Note  Patient: Calvin Henry  Procedure(s) Performed: COLONOSCOPY WITH PROPOFOL ESOPHAGOGASTRODUODENOSCOPY (EGD) WITH PROPOFOL MALONEY DILATION BIOPSY POLYPECTOMY  Patient location during evaluation: Phase II Anesthesia Type: General Level of consciousness: awake and alert and oriented Pain management: pain level controlled Vital Signs Assessment: post-procedure vital signs reviewed and stable Respiratory status: spontaneous breathing, nonlabored ventilation and respiratory function stable Cardiovascular status: blood pressure returned to baseline and stable Postop Assessment: no apparent nausea or vomiting Anesthetic complications: no  No notable events documented.   Last Vitals:  Vitals:   11/26/22 0817 11/26/22 1048  BP: 112/65 100/62  Pulse: 80 79  Resp: (!) 21 16  Temp: 36.8 C (!) 36.4 C  SpO2: 100% 99%    Last Pain:  Vitals:   11/26/22 1048  TempSrc: Oral  PainSc: 0-No pain                  C 

## 2022-11-26 NOTE — Telephone Encounter (Signed)
Rx sent to pharmacy on file.

## 2022-11-26 NOTE — Discharge Instructions (Addendum)
EGD Discharge instructions Please read the instructions outlined below and refer to this sheet in the next few weeks. These discharge instructions provide you with general information on caring for yourself after you leave the hospital. Your doctor may also give you specific instructions. While your treatment has been planned according to the most current medical practices available, unavoidable complications occasionally occur. If you have any problems or questions after discharge, please call your doctor. ACTIVITY You may resume your regular activity but move at a slower pace for the next 24 hours.  Take frequent rest periods for the next 24 hours.  Walking will help expel (get rid of) the air and reduce the bloated feeling in your abdomen.  No driving for 24 hours (because of the anesthesia (medicine) used during the test).  You may shower.  Do not sign any important legal documents or operate any machinery for 24 hours (because of the anesthesia used during the test).  NUTRITION Drink plenty of fluids.  You may resume your normal diet.  Begin with a light meal and progress to your normal diet.  Avoid alcoholic beverages for 24 hours or as instructed by your caregiver.  MEDICATIONS You may resume your normal medications unless your caregiver tells you otherwise.  WHAT YOU CAN EXPECT TODAY You may experience abdominal discomfort such as a feeling of fullness or "gas" pains.  FOLLOW-UP Your doctor will discuss the results of your test with you.  SEEK IMMEDIATE MEDICAL ATTENTION IF ANY OF THE FOLLOWING OCCUR: Excessive nausea (feeling sick to your stomach) and/or vomiting.  Severe abdominal pain and distention (swelling).  Trouble swallowing.  Temperature over 101 F (37.8 C).  Rectal bleeding or vomiting of blood.    Colonoscopy Discharge Instructions  Read the instructions outlined below and refer to this sheet in the next few weeks. These discharge instructions provide you with  general information on caring for yourself after you leave the hospital. Your doctor may also give you specific instructions. While your treatment has been planned according to the most current medical practices available, unavoidable complications occasionally occur. If you have any problems or questions after discharge, call Dr. Jena Gauss at [phone]. ACTIVITY You may resume your regular activity, but move at a slower pace for the next 24 hours.  Take frequent rest periods for the next 24 hours.  Walking will help get rid of the air and reduce the bloated feeling in your belly (abdomen).  No driving for 24 hours (because of the medicine (anesthesia) used during the test).   Do not sign any important legal documents or operate any machinery for 24 hours (because of the anesthesia used during the test).  NUTRITION Drink plenty of fluids.  You may resume your normal diet as instructed by your doctor.  Begin with a light meal and progress to your normal diet. Heavy or fried foods are harder to digest and may make you feel sick to your stomach (nauseated).  Avoid alcoholic beverages for 24 hours or as instructed.  MEDICATIONS You may resume your normal medications unless your doctor tells you otherwise.  WHAT YOU CAN EXPECT TODAY Some feelings of bloating in the abdomen.  Passage of more gas than usual.  Spotting of blood in your stool or on the toilet paper.  IF YOU HAD POLYPS REMOVED DURING THE COLONOSCOPY: No aspirin products for 7 days or as instructed.  No alcohol for 7 days or as instructed.  Eat a soft diet for the next 24 hours.  FINDING  OUT THE RESULTS OF YOUR TEST Not all test results are available during your visit. If your test results are not back during the visit, make an appointment with your caregiver to find out the results. Do not assume everything is normal if you have not heard from your caregiver or the medical facility. It is important for you to follow up on all of your test  results.  SEEK IMMEDIATE MEDICAL ATTENTION IF: You have more than a spotting of blood in your stool.  Your belly is swollen (abdominal distention).  You are nauseated or vomiting.  You have a temperature over 101.  You have abdominal pain or discomfort that is severe or gets worse throughout the day.      Your esophagus was stretched today.  Your stomach was inflamed.  Biopsies taken.  Begin Protonix 40 mg once daily 30 minutes before breakfast   To decrease stomach acid.Marland Kitchen  A new prescription has been sent to your pharmacy.    Small polyp in your colon removed  Colon polyp and diverticulosis information provided   resume Eliquis today   further recommendations to follow pending review of pathology report   office visit with [name] in 3 months   at patient request, I called Earl Lites at [phone] -  rolled to voicemail.  Left a detailed message.

## 2022-11-26 NOTE — Telephone Encounter (Signed)
-----   Message from Eula Listen sent at 11/26/2022 10:56 AM EDT -----   Needs new prescription for Protonix 40 mg tablet 1 -  30 minutes for breakfast daily.  Dispense 30 with 11 refills.  Thanks.

## 2022-11-26 NOTE — H&P (Signed)
@LOGO @   Primary Care Physician:  Avis Epley, PA-C Primary Gastroenterologist:  Dr. Jena Gauss  Pre-Procedure History & Physical: HPI:  Calvin Henry is a 62 y.o. male here for   Further evaluation of poorly controlled GERD esophageal dysphagia.  History of known Schatzki's ring.  History of multiple colonic adenomas removed previously he is here for EGD with esophageal dilation is feasible/appropriate along with a surveillance colonoscopy.  Past Medical History:  Diagnosis Date   A-fib (HCC)    Acid reflux    Arthritis    Atrial flutter (HCC)    a. diagnosed in 02/2017 with spontaneous conversion back to NSR.    CAD (coronary artery disease)    Chronic back pain    CKD (chronic kidney disease) stage 3, GFR 30-59 ml/min (HCC)    Essential hypertension    H. pylori infection    Treated with Prevpac   Hemorrhoids    PSVT (paroxysmal supraventricular tachycardia)    a. s/p ablation in 12/2016   Restrictive cardiomyopathy (HCC)    Tubular adenoma     Past Surgical History:  Procedure Laterality Date   COLONOSCOPY  08/07/10   friable anal canal otherwise normal   COLONOSCOPY [name] 03/18/2017   Procedure: COLONOSCOPY;  Surgeon: Corbin Ade, MD;  Location: [facility];  Service: Endoscopy;  Laterality: [name];  1:15pm   COLONOSCOPY WITH ESOPHAGOGASTRODUODENOSCOPY (EGD) [name] 07/06/2013   Dr. Jena Gauss- inflamed hemorrhoids- multiple colonic polyps= tubular adenoma. stomach bx= hpylori treated with prevpac   ESOPHAGOGASTRODUODENOSCOPY  08/07/10   schatzkis ring otherwise normal, s/p 56-F dilation, small hiatal hernia.chronic active gastritis on bx   HEMORRHOID BANDING     HERNIA REPAIR     HYDROCELE EXCISION Bilateral 08/20/2022   Procedure: HYDROCELECTOMY ADULT;  Surgeon: Malen Gauze, MD;  Location: [facility];  Service: Urology;  Laterality: Bilateral;   SVT ABLATION [name] 12/15/2016   Procedure: SVT Ablation;  Surgeon: Hillis Range, MD;  Location: [facility];  Service:  Cardiovascular;  Laterality: [name];   TOTAL HIP ARTHROPLASTY Left 10/04/2015   Procedure: LEFT TOTAL HIP ARTHROPLASTY ANTERIOR APPROACH;  Surgeon: Kathryne Hitch, MD;  Location: [facility];  Service: Orthopedics;  Laterality: Left;   UMBILICAL HERNIA REPAIR  08/05/2011   Procedure: HERNIA REPAIR UMBILICAL ADULT;  Surgeon: Dalia Heading, MD;  Location: [facility];  Service: General;  Laterality: [name];    Prior to Admission medications   Medication Sig Start Date End Date Taking? Authorizing Provider  acetaminophen (TYLENOL) 500 MG tablet Take 1,000 mg by mouth every 6 (six) hours as needed for moderate pain.   Yes [provider]  allopurinol (ZYLOPRIM) 100 MG tablet Take 100 mg by mouth daily. 07/20/22  Yes [provider]  amLODipine (NORVASC) 5 MG tablet Take 5 mg by mouth daily. 12/19/21  Yes [provider]  apixaban (ELIQUIS) 5 MG TABS tablet Take 5 mg by mouth 2 (two) times daily. 10/08/21  Yes [provider]  atorvastatin (LIPITOR) 80 MG tablet Take 80 mg by mouth daily. 09/17/20  Yes [provider]  chlorthalidone (HYGROTON) 25 MG tablet Take 12.5 mg by mouth every Monday, Wednesday, and Friday.   Yes [provider]  Cholecalciferol (VITAMIN D3 SUPER STRENGTH) 50 MCG (2000 UT) CAPS Take 1 capsule by mouth daily.   Yes [provider]  furosemide (LASIX) 20 MG tablet Take 20 mg by mouth daily.   Yes [provider]  HYDROcodone-acetaminophen (NORCO) 5-325 MG tablet Take 1  tablet by mouth every 6 (six) hours as needed for moderate pain. 08/20/22  Yes McKenzie, Mardene Celeste, MD  isosorbide mononitrate (IMDUR) 30 MG 24 hr tablet Take 30 mg by mouth daily. 07/17/19  Yes [provider]  metoprolol succinate (TOPROL-XL) 50 MG 24 hr tablet TAKE 1 TABLET BY MOUTH DAILY 09/21/18  Yes Jonelle Sidle, MD  olmesartan (BENICAR) 20 MG tablet Take 20 mg by mouth daily.   Yes [provider]  oxyCODONE-acetaminophen (PERCOCET)  10-325 MG tablet Take 1 tablet by mouth every 4 (four) hours as needed for pain.   Yes [provider]    Allergies as of 10/28/2022   (No Known Allergies)    Family History  Problem Relation Age of Onset   Diabetes Mother    Hypertension Mother    Colon cancer Neg Hx     Social History   Socioeconomic History   Marital status: Married    Spouse name: Not on file   Number of children: Not on file   Years of education: Not on file   Highest education level: Not on file  Occupational History   Not on file  Tobacco Use   Smoking status: Former    Current packs/day: 0.00    Average packs/day: 0.5 packs/day for 10.0 years (5.0 ttl pk-yrs)    Types: Cigars, Cigarettes    Start date: 11/30/1976    Quit date: 12/01/1986    Years since quitting: 36.0   Smokeless tobacco: Never   Tobacco comments:    Quit x 20 plus years  Vaping Use   Vaping status: Never Used  Substance and Sexual Activity   Alcohol use: Not Currently    Comment: a beer 1-2 times a week   Drug use: No    Comment: remote marijuana use    Sexual activity: Yes  Other Topics Concern   Not on file  Social History Narrative   ** Merged History Encounter **       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   Social Determinants of Health   Financial Resource Strain: Patient Declined (05/11/2022)   Received from [employer], [facility]   Overall Financial Resource Strain (CARDIA)    Difficulty of Paying Living Expenses: Patient declined  Food Insecurity: Patient Declined (05/11/2022)   Received from [facility], [facility]   Hunger Vital Sign    Worried About Running Out of Food in the Last Year: Patient declined    Ran Out of Food in the Last Year: Patient declined  Transportation Needs: Patient Declined (05/11/2022)   Received from [employer], [facility]   PRAPARE - Transportation    Lack of Transportation (Medical): Patient declined    Lack of Transportation (Non-Medical):  Patient declined  Physical Activity: Inactive (10/19/2021)   [facility], [facility]   Exercise Vital Sign    Days of Exercise per Week: 0 days    Minutes of Exercise per Session: 0 min  Stress: No Stress Concern Present (10/19/2021)   Received from [facility], [facility]   [employer] of Occupational Health - Occupational Stress Questionnaire    Feeling of Stress : Not at all  Social Connections: Unknown (10/25/2022)   Received from [facility]   Social Network    Social Network: Not on file  Intimate Partner Violence: Unknown (10/25/2022)   Received from [facility]   HITS    Physically Hurt: Not on file    Insult  or Talk Down To: Not on file    Threaten Physical Harm: Not on file    Scream or Curse: Not on file    Review of Systems: See HPI, otherwise negative ROS  Physical Exam: BP 112/65   Pulse 80   Temp 98.2 F (36.8 C) (Oral)   Resp (!) 21   Ht 5\' 6"  (1.676 m)   Wt 108.8 kg   SpO2 100%   BMI 38.71 kg/m  General:   Alert,  Well-developed, well-nourished, pleasant and cooperative in NAD Skin:  Intact without significant lesions or rashes. Neck:  Supple; no masses or thyromegaly. No significant cervical adenopathy. Lungs:  Clear throughout to auscultation.   No wheezes, crackles, or rhonchi. No acute distress. Heart:  Regular rate and rhythm; no murmurs, clicks, rubs,  or gallops. Abdomen: Non-distended, normal bowel sounds.  Soft and nontender without appreciable mass or hepatosplenomegaly.   Impression/Plan:    62 year old gentleman poorly controlled GERD, esophageal dysphagia.  History of colonic adenoma.  Here for EGD with possible esophageal dilation is feasible/appropriate and surveillance colonoscopy. The risks, benefits, limitations, imponderables and alternatives regarding both EGD and colonoscopy have been reviewed with the patient. Questions have been answered. All parties agreeable.       Notice: This dictation was  prepared with Dragon dictation along with smaller phrase technology. Any transcriptional errors that result from this process are unintentional and may not be corrected upon review.

## 2022-11-26 NOTE — Anesthesia Procedure Notes (Signed)
Date/Time: 11/26/2022 10:17 AM  Performed by: Julian Reil, CRNAPre-anesthesia Checklist: Patient identified, Emergency Drugs available, Suction available and Patient being monitored Patient Re-evaluated:Patient Re-evaluated prior to induction Oxygen Delivery Method: Nasal cannula Induction Type: IV induction Placement Confirmation: positive ETCO2 Comments: Optiflow High Flow [place] O2 used.

## 2022-11-26 NOTE — Transfer of Care (Signed)
Immediate Anesthesia Transfer of Care Note  Patient: Calvin Henry  Procedure(s) Performed: COLONOSCOPY WITH PROPOFOL ESOPHAGOGASTRODUODENOSCOPY (EGD) WITH PROPOFOL MALONEY DILATION BIOPSY POLYPECTOMY  Patient Location: Short Stay  Anesthesia Type:General  Level of Consciousness: drowsy  Airway & Oxygen Therapy: Patient Spontanous Breathing  Post-op Assessment: Report given to RN and Post -op Vital signs reviewed and stable  Post vital signs: Reviewed and stable  Last Vitals:  Vitals Value Taken Time  BP    Temp    Pulse    Resp    SpO2      Last Pain:  Vitals:   11/26/22 1005  TempSrc:   PainSc: 0-No pain         Complications: No notable events documented.

## 2022-11-26 NOTE — Anesthesia Preprocedure Evaluation (Signed)
Anesthesia Evaluation  Patient identified by MRN, date of birth, ID band Patient awake    Reviewed: Allergy & Precautions, H&P , NPO status , Patient's Chart, lab work & pertinent test results, reviewed documented beta blocker date and time   Airway Mallampati: III  TM Distance: >3 FB Neck ROM: Full    Dental  (+) Dental Advisory Given, Missing   Pulmonary neg pulmonary ROS, former smoker   Pulmonary exam normal breath sounds clear to auscultation       Cardiovascular Exercise Tolerance: Good hypertension, Pt. on medications and Pt. on home beta blockers + angina  + CAD  + dysrhythmias Atrial Fibrillation and Supra Ventricular Tachycardia  Rhythm:Regular Rate:Normal  24-Nov-2022 14:02:10 [facility] ROUTINE RECORD 1961/03/29 (61 yr) Male Black Vent. rate 63 BPM PR interval 172 ms QRS duration 88 ms QT/QTcB 394/403 ms P-R-T axes 40 -10 -8 Normal sinus rhythm Low voltage QRS Borderline ECG When compared with ECG of 15-Feb-2017 13:25,t wave inversions resolved PREVIOUS ECG IS PRESENT Confirmed by Carylon Perches [phone]) on 11/25/2022 9:37:12 AM   Neuro/Psych  PSYCHIATRIC DISORDERS Anxiety     negative neurological ROS     GI/Hepatic ,GERD  Medicated and Controlled,,(+)     substance abuse  alcohol use  Endo/Other  negative endocrine ROS    Renal/GU Renal InsufficiencyRenal disease  negative genitourinary   Musculoskeletal  (+) Arthritis , Osteoarthritis,    Abdominal   Peds negative pediatric ROS (+)  Hematology negative hematology ROS (+)   Anesthesia Other Findings   Reproductive/Obstetrics negative OB ROS                             Anesthesia Physical Anesthesia Plan  ASA: 3  Anesthesia Plan: General   Post-op Pain Management: Minimal or no pain anticipated   Induction: Intravenous  PONV Risk Score and Plan: 1 and Propofol infusion  Airway Management  Planned: Nasal Cannula and Natural Airway  Additional Equipment:   Intra-op Plan:   Post-operative Plan:   Informed Consent: I have reviewed the patients History and Physical, chart, labs and discussed the procedure including the risks, benefits and alternatives for the proposed anesthesia with the patient or authorized representative who has indicated his/her understanding and acceptance.     Dental advisory given  Plan Discussed with: CRNA and Surgeon  Anesthesia Plan Comments:         Anesthesia Quick Evaluation

## 2022-11-26 NOTE — Op Note (Addendum)
[facility] Patient Name: Calvin Henry Procedure Date: 11/26/2022 10:19 AM MRN: [id number] Date of Birth: [birth year] Attending MD: Gennette Pac , MD, [id number] CSN: [id number] Age: 62 Admit Type: Outpatient Procedure:                Colonoscopy Indications:              High risk colon cancer surveillance: Personal                            history of colonic polyps Providers:                Gennette Pac, MD, Tammy Vaught, RN,                            Kristine L. Jessee Avers, Technician Referring MD:              Medicines:                Propofol per Anesthesia Complications:            No immediate complications. Estimated Blood Loss:     Estimated blood loss was minimal. Procedure:                Pre-Anesthesia Assessment:                           - Prior to the procedure, a History and Physical                            was performed, and patient medications and                            allergies were reviewed. The patient's tolerance of                            previous anesthesia was also reviewed. The risks                            and benefits of the procedure and the sedation                            options and risks were discussed with the patient.                            All questions were answered, and informed consent                            was obtained. Prior Anticoagulants: The patient has                            taken no anticoagulant or antiplatelet agents and                            last took Eliquis (apixaban) 3 days prior to the  procedure. ASA Grade Assessment: III - A patient                            with severe systemic disease. After reviewing the                            risks and benefits, the patient was deemed in                            satisfactory condition to undergo the procedure.                           After obtaining informed consent, the colonoscope                             was passed under direct vision. Throughout the                            procedure, the patient's blood pressure, pulse, and                            oxygen saturations were monitored continuously. The                            [id number] scope was introduced through the                            anus and advanced to the the cecum, identified by                            appendiceal orifice and ileocecal valve. The                            colonoscopy was performed without difficulty. The                            patient tolerated the procedure well. The quality                            of the bowel preparation was adequate. The entire                            colon was well visualized. Scope In: 10:22:12 AM Scope Out: 10:32:43 AM Scope Withdrawal Time: 0 hours 7 minutes 0 seconds  Total Procedure Duration: 0 hours 10 minutes 31 seconds  Findings:      The perianal and digital rectal examinations were normal.      A 4 mm polyp was found in the ileocecal valve. The polyp was sessile.       The polyp was removed with a cold snare. Resection and retrieval were       complete. Estimated blood loss was minimal.      Scattered small-mouthed diverticula were found in the sigmoid colon and       descending colon.      The exam  was otherwise without abnormality on direct and retroflexion       views. Impression:               - One 4 mm polyp at the ileocecal valve, removed                            with a cold snare. Resected and retrieved.                           - Diverticulosis in the sigmoid colon and in the                            descending colon.                           - The examination was otherwise normal on direct                            and retroflexion views. Moderate Sedation:      Moderate (conscious) sedation was personally administered by an       anesthesia professional. The following parameters were monitored: oxygen        saturation, heart rate, blood pressure, respiratory rate, EKG, adequacy       of pulmonary ventilation, and response to care. Recommendation:           - Patient has a contact number available for                            emergencies. The signs and symptoms of potential                            delayed complications were discussed with the                            patient. Return to normal activities tomorrow.                            Written discharge instructions were provided to the                            patient.                           - Advance diet as tolerated.                           - Continue present medications.                           - Repeat colonoscopy date to be determined after                            pending pathology results are reviewed for                            surveillance.                           -  Return to GI office in 3 months. See EGD report. Procedure Code(s):        --- Professional ---                           [phone], Colonoscopy, flexible; with removal of                            tumor(s), polyp(s), or other lesion(s) by snare                            technique Diagnosis Code(s):        --- Professional ---                           Z86.010, Personal history of colonic polyps                           D12.0, Benign neoplasm of cecum                           K57.30, Diverticulosis of large intestine without                            perforation or abscess without bleeding CPT copyright 2022 American Medical Association. All rights reserved. The codes documented in this report are preliminary and upon coder review may  be revised to meet current compliance requirements. [name]. , MD Gennette Pac, MD 11/26/2022 10:43:01 AM This report has been signed electronically. Number of Addenda: 0

## 2022-11-27 DIAGNOSIS — E291 Testicular hypofunction: Secondary | ICD-10-CM | POA: Diagnosis not present

## 2022-11-27 DIAGNOSIS — M25561 Pain in right knee: Secondary | ICD-10-CM | POA: Diagnosis not present

## 2022-11-27 DIAGNOSIS — R6889 Other general symptoms and signs: Secondary | ICD-10-CM | POA: Diagnosis not present

## 2022-11-27 LAB — SURGICAL PATHOLOGY

## 2022-11-29 ENCOUNTER — Encounter: Payer: Self-pay | Admitting: Internal Medicine

## 2022-12-01 ENCOUNTER — Encounter (HOSPITAL_COMMUNITY): Payer: Self-pay | Admitting: Internal Medicine

## 2022-12-18 DIAGNOSIS — E6609 Other obesity due to excess calories: Secondary | ICD-10-CM | POA: Diagnosis not present

## 2022-12-18 DIAGNOSIS — Z6837 Body mass index (BMI) 37.0-37.9, adult: Secondary | ICD-10-CM | POA: Diagnosis not present

## 2022-12-18 DIAGNOSIS — F1120 Opioid dependence, uncomplicated: Secondary | ICD-10-CM | POA: Diagnosis not present

## 2022-12-18 DIAGNOSIS — G894 Chronic pain syndrome: Secondary | ICD-10-CM | POA: Diagnosis not present

## 2022-12-18 DIAGNOSIS — M5136 Other intervertebral disc degeneration, lumbar region: Secondary | ICD-10-CM | POA: Diagnosis not present

## 2022-12-18 DIAGNOSIS — E291 Testicular hypofunction: Secondary | ICD-10-CM | POA: Diagnosis not present

## 2022-12-18 DIAGNOSIS — M1611 Unilateral primary osteoarthritis, right hip: Secondary | ICD-10-CM | POA: Diagnosis not present

## 2022-12-18 DIAGNOSIS — M1711 Unilateral primary osteoarthritis, right knee: Secondary | ICD-10-CM | POA: Diagnosis not present

## 2022-12-23 ENCOUNTER — Ambulatory Visit: Payer: Medicare HMO | Admitting: Urology

## 2022-12-23 VITALS — BP 119/70 | HR 93

## 2022-12-23 DIAGNOSIS — E291 Testicular hypofunction: Secondary | ICD-10-CM

## 2022-12-23 DIAGNOSIS — N529 Male erectile dysfunction, unspecified: Secondary | ICD-10-CM | POA: Diagnosis not present

## 2022-12-23 MED ORDER — JATENZO 198 MG PO CAPS: 1.0000 | ORAL_CAPSULE | Freq: Two times a day (BID) | ORAL | 5 refills | Status: DC

## 2022-12-23 NOTE — Progress Notes (Signed)
12/23/2022 9:25 AM   Calvin Henry February 18, 1961 [id number]  Referring provider: Avis Epley, PA-C [address] [place],  [place] [address]  Followup hypogonadism and erectile dysfunction   HPI: Mr Trimnal is a 62yo here for followup for erectile dysfunction and hypogonadism. Patient started on monthly injections and he has had 2 doses. He is currently on monthly IM testosterone. He is fatigued. He has decreased libido. He continue to have difficulty getting and maintianing his erection.    PMH: Past Medical History:  Diagnosis Date   A-fib (HCC)    Acid reflux    Arthritis    Atrial flutter (HCC)    a. diagnosed in 02/2017 with spontaneous conversion back to NSR.    CAD (coronary artery disease)    Chronic back pain    CKD (chronic kidney disease) stage 3, GFR 30-59 ml/min (HCC)    Essential hypertension    H. pylori infection    Treated with Prevpac   Hemorrhoids    PSVT (paroxysmal supraventricular tachycardia)    a. s/p ablation in 12/2016   Restrictive cardiomyopathy (HCC)    Tubular adenoma     Surgical History: Past Surgical History:  Procedure Laterality Date   BIOPSY  11/26/2022   Procedure: BIOPSY;  Surgeon: Corbin Ade, MD;  [facility];  Service: Endoscopy;;   COLONOSCOPY  08/07/10   friable anal canal otherwise normal   COLONOSCOPY [name] 03/18/2017   Procedure: COLONOSCOPY;  Surgeon: Corbin Ade, MD;  [facility];  Service: Endoscopy;  Laterality: [name];  1:15pm   COLONOSCOPY WITH ESOPHAGOGASTRODUODENOSCOPY (EGD) [name] 07/06/2013   Dr. Jena Gauss- inflamed hemorrhoids- multiple colonic polyps= tubular adenoma. stomach bx= hpylori treated with prevpac   COLONOSCOPY WITH PROPOFOL [name] 11/26/2022   Procedure: COLONOSCOPY WITH PROPOFOL;  Surgeon: Corbin Ade, MD;  [facility];  Service: Endoscopy;  Laterality: [name];  10:00am, asa 3   ESOPHAGOGASTRODUODENOSCOPY  08/07/10   schatzkis ring otherwise normal, s/p  56-F dilation, small hiatal hernia.chronic active gastritis on bx   ESOPHAGOGASTRODUODENOSCOPY (EGD) WITH PROPOFOL [name] 11/26/2022   Procedure: ESOPHAGOGASTRODUODENOSCOPY (EGD) WITH PROPOFOL;  Surgeon: Corbin Ade, MD;  [facility];  Service: Endoscopy;  Laterality: [name];   HEMORRHOID BANDING     HERNIA REPAIR     HYDROCELE EXCISION Bilateral 08/20/2022   Procedure: HYDROCELECTOMY ADULT;  Surgeon: Malen Gauze, MD;  [facility]: [facility];  Service: Urology;  Laterality: Bilateral;   MALONEY DILATION [name] 11/26/2022   Procedure: Elease Hashimoto DILATION;  Surgeon: Corbin Ade, MD;  [facility];  Service: Endoscopy;  Laterality: [name];   POLYPECTOMY  11/26/2022   Procedure: POLYPECTOMY;  Surgeon: Corbin Ade, MD;  [facility];  Service: Endoscopy;;   SVT ABLATION [name] 12/15/2016   Procedure: SVT Ablation;  Surgeon: Hillis Range, MD;  [facility];  Service: Cardiovascular;  Laterality: [name];   TOTAL HIP ARTHROPLASTY Left 10/04/2015   Procedure: LEFT TOTAL HIP ARTHROPLASTY ANTERIOR APPROACH;  Surgeon: Kathryne Hitch, MD;  [facility];  Service: Orthopedics;  Laterality: Left;   UMBILICAL HERNIA REPAIR  08/05/2011   Procedure: HERNIA REPAIR UMBILICAL ADULT;  Surgeon: Dalia Heading, MD;  Location: [facility];  Service: General;  Laterality: [name];    Home Medications:  Allergies as of 12/23/2022   No Known Allergies      Medication List        Accurate as of December 23, 2022  9:25 AM. If you have any questions, ask your nurse or doctor.          acetaminophen 500 MG tablet Commonly known as: TYLENOL Take 1,000 mg by mouth every 6 (six) hours as needed for moderate pain.   allopurinol 100 MG tablet Commonly known as: ZYLOPRIM Take 100 mg by mouth daily.   amLODipine 5 MG tablet Commonly known as: NORVASC Take 5 mg by mouth daily.   apixaban 5 MG Tabs tablet Commonly known as: ELIQUIS Take 5 mg by mouth 2 (two) times  daily.   atorvastatin 80 MG tablet Commonly known as: LIPITOR Take 80 mg by mouth daily.   chlorthalidone 25 MG tablet Commonly known as: HYGROTON Take 12.5 mg by mouth every Monday, Wednesday, and Friday.   furosemide 20 MG tablet Commonly known as: LASIX Take 20 mg by mouth daily.   HYDROcodone-acetaminophen 5-325 MG tablet Commonly known as: Norco Take 1 tablet by mouth every 6 (six) hours as needed for moderate pain.   isosorbide mononitrate 30 MG 24 hr tablet Commonly known as: IMDUR Take 30 mg by mouth daily.   Jatenzo 198 MG Caps Generic drug: Testosterone Undecanoate Take 1 tablet by mouth in the morning and at bedtime.   metoprolol succinate 50 MG 24 hr tablet Commonly known as: TOPROL-XL TAKE 1 TABLET BY MOUTH DAILY   olmesartan 20 MG tablet Commonly known as: BENICAR Take 20 mg by mouth daily.   oxyCODONE-acetaminophen 10-325 MG tablet Commonly known as: PERCOCET Take 1 tablet by mouth every 4 (four) hours as needed for pain.   pantoprazole 40 MG tablet Commonly known as: PROTONIX Take 1 tablet (40 mg total) by mouth daily.   Vitamin D3 Super Strength 50 MCG (2000 UT) Caps Generic drug: Cholecalciferol Take 1 capsule by mouth daily.        Allergies: No Known Allergies  Family History: Family History  Problem Relation Age of Onset   Diabetes Mother    Hypertension Mother    Colon cancer Neg Hx     Social History:  reports that he quit smoking about 36 years ago. His smoking use included cigars and cigarettes. He started smoking about 46 years ago. He has a 5 pack-year smoking history. He has never used smokeless tobacco. He reports that he does not currently use alcohol. He reports that he does not use drugs.  ROS: All other review of systems were reviewed and are negative except what is noted above in HPI  Physical Exam: BP 119/70   Pulse 93   Constitutional:  Alert and oriented, No acute distress. HEENT: [place] AT, moist mucus membranes.   Trachea midline, no masses. Cardiovascular: No clubbing, cyanosis, or edema. Respiratory: Normal respiratory effort, no increased work of breathing. GI: Abdomen is soft, nontender, nondistended, no abdominal masses GU: No CVA tenderness.  Lymph: No cervical or inguinal lymphadenopathy. Skin: No rashes, bruises or suspicious lesions. Neurologic: Grossly intact, no focal deficits, moving all 4 extremities. Psychiatric: Normal mood and affect.  Laboratory Data: Lab Results  Component Value Date   WBC 6.0 10/02/2022   HGB 13.9 10/02/2022   HCT 42.0 10/02/2022   MCV 88 10/02/2022   PLT 240 10/02/2022    Lab Results  Component Value Date   CREATININE 1.47 (H) 11/24/2022    No results found for: "PSA"  Lab Results  Component Value Date   TESTOSTERONE 245 (L) 10/02/2022    No results found for: "HGBA1C"  Urinalysis    Component Value Date/Time  COLORURINE YELLOW 08/17/2013 1403   APPEARANCEUR Clear 05/13/2022 0853   LABSPEC 1.020 08/17/2013 1403   PHURINE 5.5 08/17/2013 1403   GLUCOSEU Negative 05/13/2022 0853   HGBUR NEGATIVE 08/17/2013 1403   BILIRUBINUR Negative 05/13/2022 0853   KETONESUR NEGATIVE 08/17/2013 1403   PROTEINUR Negative 05/13/2022 0853   PROTEINUR NEGATIVE 08/17/2013 1403   UROBILINOGEN 0.2 08/17/2013 1403   NITRITE Negative 05/13/2022 0853   NITRITE NEGATIVE 08/17/2013 1403   LEUKOCYTESUR Negative 05/13/2022 0853    Lab Results  Component Value Date   LABMICR Comment 05/13/2022    Pertinent Imaging:  No results found for this or any previous visit.  No results found for this or any previous visit.  No results found for this or any previous visit.  No results found for this or any previous visit.  No results found for this or any previous visit.  No valid procedures specified. No results found for this or any previous visit.  No results found for this or any previous visit.   Assessment & Plan:    1. Erectile dysfunction,  unspecified erectile dysfunction type -we have deferred treatment until his hypogonadism has been addressed  2. Hypogonadism male -Jatenzo 198mg  BID - Testosterone,Free and Total; Future - CBC; Future - Comprehensive metabolic panel; Future   No follow-ups on file.  Wilkie Aye, MD  [facility] [place]

## 2022-12-28 ENCOUNTER — Telehealth: Payer: Self-pay

## 2022-12-28 NOTE — Telephone Encounter (Signed)
Patient left a telephone message 12-25-2022  Patient requesting a cheaper priced medication.  Please advise.

## 2022-12-29 ENCOUNTER — Encounter: Payer: Self-pay | Admitting: Urology

## 2022-12-29 ENCOUNTER — Other Ambulatory Visit: Payer: Self-pay | Admitting: Urology

## 2022-12-29 ENCOUNTER — Other Ambulatory Visit: Payer: Self-pay

## 2022-12-29 MED ORDER — "NEEDLE (DISP) 18G X 1-1/2"" MISC": 12 refills | Status: AC

## 2022-12-29 MED ORDER — TESTOSTERONE CYPIONATE 200 MG/ML IM SOLN: 200.0000 mg | INTRAMUSCULAR | 1 refills | Status: DC

## 2022-12-29 MED ORDER — "SYRINGE/NEEDLE (DISP) 22G X 1-1/2"" 3 ML MISC": 12 refills | Status: AC

## 2022-12-29 NOTE — Patient Instructions (Signed)

## 2022-12-29 NOTE — Telephone Encounter (Signed)
Needles and syringe sent to [facility], patient aware to bring to apt for testosterone teaching.

## 2022-12-29 NOTE — Telephone Encounter (Signed)
Patient scheduled for testosterone teaching on 10/01.  He would like to do testosterone injections at this time.  Please send rx to [facility] drug.

## 2023-01-07 ENCOUNTER — Encounter: Payer: Self-pay | Admitting: Urology

## 2023-01-12 ENCOUNTER — Ambulatory Visit (INDEPENDENT_AMBULATORY_CARE_PROVIDER_SITE_OTHER): Payer: Medicare HMO

## 2023-01-12 DIAGNOSIS — E291 Testicular hypofunction: Secondary | ICD-10-CM

## 2023-01-12 DIAGNOSIS — Z7189 Other specified counseling: Secondary | ICD-10-CM | POA: Diagnosis not present

## 2023-01-12 MED ORDER — TESTOSTERONE CYPIONATE 200 MG/ML IM SOLN
200.0000 mg | Freq: Once | INTRAMUSCULAR | Status: AC
Start: 2023-01-12 — End: 2023-01-12
  Administered 2023-01-12: 200 mg via INTRAMUSCULAR

## 2023-01-12 NOTE — Patient Instructions (Signed)

## 2023-01-12 NOTE — Progress Notes (Signed)
Patient presents for testosterone teaching instructions.  Patient was able to inject 1ml of testosterone without complications.  Patient states that he was not sure he would be able to continue self injections.  Patient will reach out if he is not comfortable giving testosterone injections to be scheduled for nurse visit.    Guss Bunde, CMA

## 2023-01-18 DIAGNOSIS — Z6837 Body mass index (BMI) 37.0-37.9, adult: Secondary | ICD-10-CM | POA: Diagnosis not present

## 2023-01-18 DIAGNOSIS — E291 Testicular hypofunction: Secondary | ICD-10-CM | POA: Diagnosis not present

## 2023-01-18 DIAGNOSIS — R5383 Other fatigue: Secondary | ICD-10-CM | POA: Diagnosis not present

## 2023-01-18 DIAGNOSIS — G894 Chronic pain syndrome: Secondary | ICD-10-CM | POA: Diagnosis not present

## 2023-01-18 DIAGNOSIS — R109 Unspecified abdominal pain: Secondary | ICD-10-CM | POA: Diagnosis not present

## 2023-01-18 DIAGNOSIS — F1120 Opioid dependence, uncomplicated: Secondary | ICD-10-CM | POA: Diagnosis not present

## 2023-01-18 DIAGNOSIS — E6609 Other obesity due to excess calories: Secondary | ICD-10-CM | POA: Diagnosis not present

## 2023-01-19 DIAGNOSIS — E291 Testicular hypofunction: Secondary | ICD-10-CM | POA: Diagnosis not present

## 2023-01-19 DIAGNOSIS — R5383 Other fatigue: Secondary | ICD-10-CM | POA: Diagnosis not present

## 2023-01-19 DIAGNOSIS — R7989 Other specified abnormal findings of blood chemistry: Secondary | ICD-10-CM | POA: Diagnosis not present

## 2023-01-19 DIAGNOSIS — I10 Essential (primary) hypertension: Secondary | ICD-10-CM | POA: Diagnosis not present

## 2023-01-25 ENCOUNTER — Encounter: Payer: Self-pay | Admitting: Gastroenterology

## 2023-01-26 DIAGNOSIS — I10 Essential (primary) hypertension: Secondary | ICD-10-CM | POA: Diagnosis not present

## 2023-01-26 DIAGNOSIS — K5730 Diverticulosis of large intestine without perforation or abscess without bleeding: Secondary | ICD-10-CM | POA: Diagnosis not present

## 2023-01-26 DIAGNOSIS — Z87891 Personal history of nicotine dependence: Secondary | ICD-10-CM | POA: Diagnosis not present

## 2023-01-26 DIAGNOSIS — M109 Gout, unspecified: Secondary | ICD-10-CM | POA: Diagnosis not present

## 2023-01-26 DIAGNOSIS — K219 Gastro-esophageal reflux disease without esophagitis: Secondary | ICD-10-CM | POA: Diagnosis not present

## 2023-01-26 DIAGNOSIS — I4891 Unspecified atrial fibrillation: Secondary | ICD-10-CM | POA: Diagnosis not present

## 2023-01-26 DIAGNOSIS — Z7901 Long term (current) use of anticoagulants: Secondary | ICD-10-CM | POA: Diagnosis not present

## 2023-01-26 DIAGNOSIS — Z79899 Other long term (current) drug therapy: Secondary | ICD-10-CM | POA: Diagnosis not present

## 2023-01-26 DIAGNOSIS — R109 Unspecified abdominal pain: Secondary | ICD-10-CM | POA: Diagnosis not present

## 2023-01-28 DIAGNOSIS — M1611 Unilateral primary osteoarthritis, right hip: Secondary | ICD-10-CM | POA: Diagnosis not present

## 2023-01-28 DIAGNOSIS — E6609 Other obesity due to excess calories: Secondary | ICD-10-CM | POA: Diagnosis not present

## 2023-01-28 DIAGNOSIS — Z6838 Body mass index (BMI) 38.0-38.9, adult: Secondary | ICD-10-CM | POA: Diagnosis not present

## 2023-01-28 DIAGNOSIS — E291 Testicular hypofunction: Secondary | ICD-10-CM | POA: Diagnosis not present

## 2023-02-01 DIAGNOSIS — E669 Obesity, unspecified: Secondary | ICD-10-CM | POA: Diagnosis not present

## 2023-02-01 DIAGNOSIS — Z131 Encounter for screening for diabetes mellitus: Secondary | ICD-10-CM | POA: Diagnosis not present

## 2023-02-01 DIAGNOSIS — R6889 Other general symptoms and signs: Secondary | ICD-10-CM | POA: Diagnosis not present

## 2023-02-01 DIAGNOSIS — I5030 Unspecified diastolic (congestive) heart failure: Secondary | ICD-10-CM | POA: Diagnosis not present

## 2023-02-01 DIAGNOSIS — I4819 Other persistent atrial fibrillation: Secondary | ICD-10-CM | POA: Diagnosis not present

## 2023-02-01 DIAGNOSIS — Z6841 Body Mass Index (BMI) 40.0 and over, adult: Secondary | ICD-10-CM | POA: Diagnosis not present

## 2023-02-03 DIAGNOSIS — I4819 Other persistent atrial fibrillation: Secondary | ICD-10-CM | POA: Diagnosis not present

## 2023-02-19 DIAGNOSIS — I10 Essential (primary) hypertension: Secondary | ICD-10-CM | POA: Diagnosis not present

## 2023-02-19 DIAGNOSIS — M1611 Unilateral primary osteoarthritis, right hip: Secondary | ICD-10-CM | POA: Diagnosis not present

## 2023-02-19 DIAGNOSIS — F1120 Opioid dependence, uncomplicated: Secondary | ICD-10-CM | POA: Diagnosis not present

## 2023-02-19 DIAGNOSIS — M1711 Unilateral primary osteoarthritis, right knee: Secondary | ICD-10-CM | POA: Diagnosis not present

## 2023-02-19 DIAGNOSIS — Z6838 Body mass index (BMI) 38.0-38.9, adult: Secondary | ICD-10-CM | POA: Diagnosis not present

## 2023-02-19 DIAGNOSIS — G894 Chronic pain syndrome: Secondary | ICD-10-CM | POA: Diagnosis not present

## 2023-02-19 DIAGNOSIS — I4819 Other persistent atrial fibrillation: Secondary | ICD-10-CM | POA: Diagnosis not present

## 2023-02-19 DIAGNOSIS — E6609 Other obesity due to excess calories: Secondary | ICD-10-CM | POA: Diagnosis not present

## 2023-02-24 DIAGNOSIS — M25551 Pain in right hip: Secondary | ICD-10-CM | POA: Diagnosis not present

## 2023-02-24 DIAGNOSIS — R6889 Other general symptoms and signs: Secondary | ICD-10-CM | POA: Diagnosis not present

## 2023-02-24 DIAGNOSIS — M545 Low back pain, unspecified: Secondary | ICD-10-CM | POA: Diagnosis not present

## 2023-03-01 ENCOUNTER — Telehealth: Payer: Self-pay

## 2023-03-01 NOTE — Telephone Encounter (Signed)
FYI I called patient back, he states his PCP sent a testosterone rx to [facility] pharmacy and changed his dosage.  Per patient he is injecting once a month now.

## 2023-03-01 NOTE — Telephone Encounter (Signed)
Patient not able to get shot today.  Calvin Henry is needing to verify the dosage of Testost cyp 200 mg/m.   Please verify dosage for patient to get shot.

## 2023-03-02 DIAGNOSIS — E291 Testicular hypofunction: Secondary | ICD-10-CM | POA: Diagnosis not present

## 2023-03-13 DIAGNOSIS — I4819 Other persistent atrial fibrillation: Secondary | ICD-10-CM | POA: Diagnosis not present

## 2023-03-13 DIAGNOSIS — E782 Mixed hyperlipidemia: Secondary | ICD-10-CM | POA: Diagnosis not present

## 2023-03-13 DIAGNOSIS — D6869 Other thrombophilia: Secondary | ICD-10-CM | POA: Diagnosis not present

## 2023-03-13 DIAGNOSIS — Z6838 Body mass index (BMI) 38.0-38.9, adult: Secondary | ICD-10-CM | POA: Diagnosis not present

## 2023-03-15 ENCOUNTER — Encounter: Payer: Self-pay | Admitting: Internal Medicine

## 2023-03-15 ENCOUNTER — Ambulatory Visit (INDEPENDENT_AMBULATORY_CARE_PROVIDER_SITE_OTHER): Payer: Medicare HMO | Admitting: Internal Medicine

## 2023-03-15 VITALS — BP 126/78 | HR 85 | Temp 98.2°F | Ht 66.0 in | Wt 250.6 lb

## 2023-03-15 DIAGNOSIS — K219 Gastro-esophageal reflux disease without esophagitis: Secondary | ICD-10-CM

## 2023-03-15 DIAGNOSIS — Z860101 Personal history of adenomatous and serrated colon polyps: Secondary | ICD-10-CM | POA: Diagnosis not present

## 2023-03-15 DIAGNOSIS — R1319 Other dysphagia: Secondary | ICD-10-CM

## 2023-03-15 NOTE — Patient Instructions (Signed)
It was nice to see you again today!  Glad you are swallowing better  Continue Protonix or pantoprazole 40 mg daily-best taken 30 minutes before breakfast  Recommend repeat colonoscopy in 7 years  Weight loss is important in managing many of your other medical problems  I recommend pool exercise at the [facility]  My goal for you is to lose 15 pounds in the next year.  Office visit in 1 year and as needed.

## 2023-03-15 NOTE — Progress Notes (Unsigned)
Primary Care Physician:  Ladon Applebaum Primary Gastroenterologist:  Dr. Jena Gauss  Pre-Procedure History & Physical: HPI:  Calvin Henry is a 62 y.o. male here for follow-up GERD/dysphagia.  Schatzki's ring found previously on EGD - dilated.  Dysphagia has resolved.  Reflux well-controlled on Protonix 40 mg daily.  At this time, no GI symptoms.  He has multiple obesity related comorbidities.  Small serrated polyp removed at recent colonoscopy; due for surveillance in 7 years.  Past Medical History:  Diagnosis Date   A-fib (HCC)    Acid reflux    Arthritis    Atrial flutter (HCC)    a. diagnosed in 02/2017 with spontaneous conversion back to NSR.    CAD (coronary artery disease)    Chronic back pain    CKD (chronic kidney disease) stage 3, GFR 30-59 ml/min (HCC)    Essential hypertension    H. pylori infection    Treated with Prevpac   Hemorrhoids    PSVT (paroxysmal supraventricular tachycardia) (HCC)    a. s/p ablation in 12/2016   Restrictive cardiomyopathy (HCC)    Tubular adenoma     Past Surgical History:  Procedure Laterality Date   BIOPSY  11/26/2022   Procedure: BIOPSY;  Surgeon: Corbin Ade, MD;  [facility];  Service: Endoscopy;;   COLONOSCOPY  08/07/10   friable anal canal otherwise normal   COLONOSCOPY [name] 03/18/2017   Procedure: COLONOSCOPY;  Surgeon: Corbin Ade, MD;  [facility];  Service: Endoscopy;  Laterality: [name];  1:15pm   COLONOSCOPY WITH ESOPHAGOGASTRODUODENOSCOPY (EGD) [name] 07/06/2013   Dr. Jena Gauss- inflamed hemorrhoids- multiple colonic polyps= tubular adenoma. stomach bx= hpylori treated with prevpac   COLONOSCOPY WITH PROPOFOL [name] 11/26/2022   Procedure: COLONOSCOPY WITH PROPOFOL;  Surgeon: Corbin Ade, MD;  [facility];  Service: Endoscopy;  Laterality: [name];  10:00am, asa 3   ESOPHAGOGASTRODUODENOSCOPY  08/07/10   schatzkis ring otherwise normal, s/p 56-F dilation, small hiatal hernia.chronic  active gastritis on bx   ESOPHAGOGASTRODUODENOSCOPY (EGD) WITH PROPOFOL [name] 11/26/2022   Procedure: ESOPHAGOGASTRODUODENOSCOPY (EGD) WITH PROPOFOL;  Surgeon: Corbin Ade, MD;  [facility];  Service: Endoscopy;  Laterality: [name];   HEMORRHOID BANDING     HERNIA REPAIR     HYDROCELE EXCISION Bilateral 08/20/2022   Procedure: HYDROCELECTOMY ADULT;  Surgeon: Malen Gauze, MD;  [facility]: [facility];  Service: Urology;  Laterality: Bilateral;   MALONEY DILATION [name] 11/26/2022   Procedure: Elease Hashimoto DILATION;  Surgeon: Corbin Ade, MD;  [facility];  Service: Endoscopy;  Laterality: [name];   POLYPECTOMY  11/26/2022   Procedure: POLYPECTOMY;  Surgeon: Corbin Ade, MD;  [facility];  Service: Endoscopy;;   SVT ABLATION [name] 12/15/2016   Procedure: SVT Ablation;  Surgeon: Hillis Range, MD;  [facility];  Service: Cardiovascular;  Laterality: [name];   TOTAL HIP ARTHROPLASTY Left 10/04/2015   Procedure: LEFT TOTAL HIP ARTHROPLASTY ANTERIOR APPROACH;  Surgeon: Kathryne Hitch, MD;  [facility];  Service: Orthopedics;  Laterality: Left;   UMBILICAL HERNIA REPAIR  08/05/2011   Procedure: HERNIA REPAIR UMBILICAL ADULT;  Surgeon: Dalia Heading, MD;  Location: [facility];  Service: General;  Laterality: [name];    Prior to Admission medications   Medication Sig Start Date End Date Taking? Authorizing Provider  acetaminophen (TYLENOL) 500 MG tablet Take 1,000 mg by mouth every 6 (six) hours as needed for moderate pain.  Yes [provider]  allopurinol (ZYLOPRIM) 100 MG tablet Take 100 mg by mouth daily. 07/20/22  Yes [provider]  amLODipine (NORVASC) 5 MG tablet Take 5 mg by mouth daily. 12/19/21  Yes [provider]  apixaban (ELIQUIS) 5 MG TABS tablet Take 5 mg by mouth 2 (two) times daily. 10/08/21  Yes [provider]  atorvastatin (LIPITOR) 80 MG tablet Take 80 mg by mouth daily. 09/17/20  Yes [provider]  chlorthalidone (HYGROTON) 25 MG tablet Take 12.5 mg by mouth every Monday, Wednesday, and Friday.   Yes [provider]  Cholecalciferol (VITAMIN D3 SUPER STRENGTH) 50 MCG (2000 UT) CAPS Take 1 capsule by mouth daily.   Yes [provider]  furosemide (LASIX) 20 MG tablet Take 20 mg by mouth daily.   Yes [provider]  isosorbide mononitrate (IMDUR) 30 MG 24 hr tablet Take 30 mg by mouth daily. 07/17/19  Yes [provider]  metFORMIN (GLUCOPHAGE-XR) 500 MG 24 hr tablet Take by mouth. 03/01/23 02/29/24 Yes [provider]  metoprolol succinate (TOPROL-XL) 50 MG 24 hr tablet TAKE 1 TABLET BY MOUTH DAILY 09/21/18  Yes Jonelle Sidle, MD  NEEDLE, DISP, 18 G 18G X 1-1/2" MISC Use to draw up testosterone 12/29/22  Yes McKenzie, Mardene Celeste, MD  olmesartan (BENICAR) 20 MG tablet Take 20 mg by mouth daily.   Yes [provider]  oxyCODONE-acetaminophen (PERCOCET) 10-325 MG tablet Take 1 tablet by mouth every 4 (four) hours as needed for pain.   Yes [provider]  pantoprazole (PROTONIX) 40 MG tablet Take 1 tablet (40 mg total) by mouth daily. 11/26/22  Yes Lynnley Doddridge, [name], MD  SYRINGE-NEEDLE, DISP, 3 ML 22G X 1-1/2" 3 ML MISC Use to inject testosterone 12/29/22  Yes McKenzie, Mardene Celeste, MD  testosterone cypionate (DEPOTESTOSTERONE CYPIONATE) 200 MG/ML injection Inject 1 mL (200 mg total) into the muscle every 14 (fourteen) days. 12/29/22  Yes McKenzie, Mardene Celeste, MD  traZODone (DESYREL) 50 MG tablet Take by mouth. 01/18/23  Yes [provider]    Allergies as of 03/15/2023   (No Known Allergies)    Family History  Problem Relation Age of Onset   Diabetes Mother    Hypertension Mother    Colon cancer Neg Hx     Social History   Socioeconomic History   Marital status: Married    Spouse name: Not on file   Number of children: Not on file   Years of education: Not on file   Highest education level: Not on  file  Occupational History   Not on file  Tobacco Use   Smoking status: Former    Current packs/day: 0.00    Average packs/day: 0.5 packs/day for 10.0 years (5.0 ttl pk-yrs)    Types: Cigars, Cigarettes    Start date: 11/30/1976    Quit date: 12/01/1986    Years since quitting: 36.3   Smokeless tobacco: Never   Tobacco comments:    Quit x 20 plus years  Vaping Use   Vaping status: Never Used  Substance and Sexual Activity   Alcohol use: Not Currently    Comment: a beer 1-2 times a week   Drug use: No    Comment: remote marijuana use    Sexual activity: Yes  Other Topics Concern   Not on file  Social History Narrative   ** Merged History Encounter **       [place]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   Social Determinants of Health   Financial Resource Strain: Patient Declined (05/11/2022)   Received from [facility]   Overall Financial Resource Strain (CARDIA)    Difficulty of Paying Living Expenses: Patient declined  Food Insecurity: Patient Declined (05/11/2022)   Received from [facility], [facility]   Hunger Vital Sign    Worried About Running Out of Food in the Last Year: Patient declined    Ran Out of Food in the Last Year: Patient declined  Transportation Needs: Patient Declined (05/11/2022)   Received from [facility]   [facility] - Transportation    Lack of Transportation (Medical): Patient declined    Lack of Transportation (Non-Medical): Patient declined  Physical Activity: Inactive (10/19/2021)   Received from [facility], [facility]   Exercise Vital Sign    Days of Exercise per Week: 0 days    Minutes of Exercise per Session: 0 min  Stress: No Stress Concern Present (10/19/2021)   Received from [facility], [facility] - Occupational Stress Questionnaire    Feeling of Stress : Not at all  Social Connections: Unknown (10/25/2022)   Received from [facility]   Social  Network    Social Network: Not on file  Intimate Partner Violence: Unknown (10/25/2022)   Received from [facility]   HITS    Physically Hurt: Not on file    Insult or Talk Down To: Not on file    Threaten Physical Harm: Not on file    Scream or Curse: Not on file    Review of Systems: See HPI, otherwise negative ROS  Physical Exam: BP 126/78 (BP Location: Right Arm, Patient Position: Sitting, Cuff Size: Large)   Pulse 85   Temp 98.2 F (36.8 C) (Oral)   Ht 5\' 6"  (1.676 m)   Wt 250 lb 9.6 oz (113.7 kg)   SpO2 97%   BMI 40.45 kg/m  General:   Alert,  Well-developed, well-nourished, pleasant and cooperative in NAD  Impression/Plan: 62 year old gentleman GERD Schatzki's ring.  Dysphagia resolved  - status post Maloney dilation.  Small serrated removed from his colon recently; due for surveillance 7 years.  Discussed the importance of weight loss which would help his multiple comorbidities.    Recommendations:  Continue Protonix or pantoprazole 40 mg daily-best taken 30 minutes before breakfast  Recommend repeat colonoscopy in 7 years  Weight loss is important in managing many of your other medical problems  I recommend pool exercise at the [facility]  My goal for patient is to lose 15 pounds in the next year.  Office visit in 1 year and as needed.      Notice: This dictation was prepared with Dragon dictation along with smaller phrase technology. Any transcriptional errors that result from this process are unintentional and may not be corrected upon review.

## 2023-03-16 DIAGNOSIS — Z713 Dietary counseling and surveillance: Secondary | ICD-10-CM | POA: Diagnosis not present

## 2023-03-16 DIAGNOSIS — N181 Chronic kidney disease, stage 1: Secondary | ICD-10-CM | POA: Diagnosis not present

## 2023-03-16 DIAGNOSIS — Z6841 Body Mass Index (BMI) 40.0 and over, adult: Secondary | ICD-10-CM | POA: Diagnosis not present

## 2023-03-16 DIAGNOSIS — E669 Obesity, unspecified: Secondary | ICD-10-CM | POA: Diagnosis not present

## 2023-03-16 DIAGNOSIS — I129 Hypertensive chronic kidney disease with stage 1 through stage 4 chronic kidney disease, or unspecified chronic kidney disease: Secondary | ICD-10-CM | POA: Diagnosis not present

## 2023-03-17 DIAGNOSIS — I4819 Other persistent atrial fibrillation: Secondary | ICD-10-CM | POA: Diagnosis not present

## 2023-03-17 DIAGNOSIS — I2510 Atherosclerotic heart disease of native coronary artery without angina pectoris: Secondary | ICD-10-CM | POA: Diagnosis not present

## 2023-03-17 DIAGNOSIS — N189 Chronic kidney disease, unspecified: Secondary | ICD-10-CM | POA: Diagnosis not present

## 2023-03-17 DIAGNOSIS — I129 Hypertensive chronic kidney disease with stage 1 through stage 4 chronic kidney disease, or unspecified chronic kidney disease: Secondary | ICD-10-CM | POA: Diagnosis not present

## 2023-03-17 DIAGNOSIS — E785 Hyperlipidemia, unspecified: Secondary | ICD-10-CM | POA: Diagnosis not present

## 2023-03-19 DIAGNOSIS — E291 Testicular hypofunction: Secondary | ICD-10-CM | POA: Diagnosis not present

## 2023-03-19 DIAGNOSIS — I10 Essential (primary) hypertension: Secondary | ICD-10-CM | POA: Diagnosis not present

## 2023-03-19 DIAGNOSIS — G894 Chronic pain syndrome: Secondary | ICD-10-CM | POA: Diagnosis not present

## 2023-03-19 DIAGNOSIS — I4819 Other persistent atrial fibrillation: Secondary | ICD-10-CM | POA: Diagnosis not present

## 2023-03-29 DIAGNOSIS — R6889 Other general symptoms and signs: Secondary | ICD-10-CM | POA: Diagnosis not present

## 2023-03-30 ENCOUNTER — Other Ambulatory Visit: Payer: Medicare HMO

## 2023-03-30 ENCOUNTER — Other Ambulatory Visit: Payer: Self-pay

## 2023-03-30 DIAGNOSIS — I129 Hypertensive chronic kidney disease with stage 1 through stage 4 chronic kidney disease, or unspecified chronic kidney disease: Secondary | ICD-10-CM | POA: Diagnosis not present

## 2023-03-30 DIAGNOSIS — I4819 Other persistent atrial fibrillation: Secondary | ICD-10-CM | POA: Diagnosis not present

## 2023-03-30 DIAGNOSIS — E291 Testicular hypofunction: Secondary | ICD-10-CM

## 2023-03-31 DIAGNOSIS — M25551 Pain in right hip: Secondary | ICD-10-CM | POA: Diagnosis not present

## 2023-03-31 LAB — CBC
Hematocrit: 49.6 % (ref 37.5–51.0)
Hemoglobin: 16.1 g/dL (ref 13.0–17.7)
MCH: 30 pg (ref 26.6–33.0)
MCHC: 32.5 g/dL (ref 31.5–35.7)
MCV: 92 fL (ref 79–97)
Platelets: 217 10*3/uL (ref 150–450)
RBC: 5.37 x10E6/uL (ref 4.14–5.80)
RDW: 14.2 % (ref 11.6–15.4)
WBC: 5.1 10*3/uL (ref 3.4–10.8)

## 2023-04-01 ENCOUNTER — Other Ambulatory Visit: Payer: Medicare HMO

## 2023-04-01 LAB — COMPREHENSIVE METABOLIC PANEL
ALT: 40 [IU]/L (ref 0–44)
AST: 36 [IU]/L (ref 0–40)
Albumin: 4.1 g/dL (ref 3.9–4.9)
Alkaline Phosphatase: 96 [IU]/L (ref 44–121)
BUN/Creatinine Ratio: 9 — ABNORMAL LOW (ref 10–24)
BUN: 14 mg/dL (ref 8–27)
Bilirubin Total: 0.5 mg/dL (ref 0.0–1.2)
CO2: 23 mmol/L (ref 20–29)
Calcium: 9.4 mg/dL (ref 8.6–10.2)
Chloride: 102 mmol/L (ref 96–106)
Creatinine, Ser: 1.6 mg/dL — ABNORMAL HIGH (ref 0.76–1.27)
Globulin, Total: 2.7 g/dL (ref 1.5–4.5)
Glucose: 134 mg/dL — ABNORMAL HIGH (ref 70–99)
Potassium: 4.5 mmol/L (ref 3.5–5.2)
Sodium: 141 mmol/L (ref 134–144)
Total Protein: 6.8 g/dL (ref 6.0–8.5)
eGFR: 48 mL/min/{1.73_m2} — ABNORMAL LOW (ref >59)

## 2023-04-01 LAB — TESTOSTERONE,FREE AND TOTAL
Testosterone, Free: 3.9 pg/mL — ABNORMAL LOW (ref 6.6–18.1)
Testosterone: 111 ng/dL — ABNORMAL LOW (ref 264–916)

## 2023-04-02 NOTE — Progress Notes (Signed)
COVID Vaccine Completed:  Date of COVID positive in last 90 days:  PCP - Terie Purser, PA Cardiologist - Nona Dell, MD  Cardiac clearance by Rennis Harding 03/17/23 in Epic   Chest x-ray - 06/19/22 [facility] EKG - 02/01/23 [facility] Stress Test -  ECHO - 04/24/16 Epic Cardiac Cath - 2/24 Pacemaker/ICD device last checked: Spinal Cord Stimulator:  Bowel Prep -   Sleep Study -  CPAP -   Fasting Blood Sugar -  Checks Blood Sugar _____ times a day  Last dose of GLP1 agonist-  [name] GLP1 instructions:  Hold 7 days before surgery    Last dose of SGLT-2 inhibitors-  [name] SGLT-2 instructions:  Hold 3 days before surgery    Blood Thinner Instructions: Eliquis, hold 72 hours Aspirin Instructions: Last Dose:  Activity level:  Can go up a flight of stairs and perform activities of daily living without stopping and without symptoms of chest pain or shortness of breath.  Able to exercise without symptoms  Unable to go up a flight of stairs without symptoms of     Anesthesia review: HTN, PAF, dysphagia, palpitations,PSVT, CAD, CKD  Patient denies shortness of breath, fever, cough and chest pain at PAT appointment  Patient verbalized understanding of instructions that were given to them at the PAT appointment. Patient was also instructed that they will need to review over the PAT instructions again at home before surgery.

## 2023-04-05 NOTE — Patient Instructions (Signed)
SURGICAL WAITING ROOM VISITATION  Patients having surgery or a procedure may have no more than 2 support people in the waiting area - these visitors may rotate.    Children under the age of 69 must have an adult with them who is not the patient.  Due to an increase in RSV and influenza rates and associated hospitalizations, children ages 30 and under may not visit patients in [facility] hospitals.  If the patient needs to stay at the hospital during part of their recovery, the visitor guidelines for inpatient rooms apply. Pre-op nurse will coordinate an appropriate time for 1 support person to accompany patient in pre-op.  This support person may not rotate.    Please refer to the [facility] website for the visitor guidelines for Inpatients (after your surgery is over and you are in a regular room).    Your procedure is scheduled on: 04/20/23   Report to [facility] Main Entrance    Report to admitting at 6:15 AM   Call this number if you have problems the morning of surgery [phone]   Do not eat food :After Midnight.   After Midnight you may have the following liquids until 5:45 AM DAY OF SURGERY  Water Non-Citrus Juices (without pulp, NO RED-Apple, White grape, White cranberry) Black Coffee (NO MILK/CREAM OR CREAMERS, sugar ok)  Clear Tea (NO MILK/CREAM OR CREAMERS, sugar ok) regular and decaf                             Plain Jell-O (NO RED)                                           Fruit ices (not with fruit pulp, NO RED)                                     Popsicles (NO RED)                                                               Sports drinks like Gatorade (NO RED)                 The day of surgery:  Drink ONE (1) Pre-Surgery Clear Ensure at 5:45 AM the morning of surgery. Drink in one sitting. Do not sip.  This drink was given to you during your hospital  pre-op appointment visit. Nothing else to drink after completing the  Pre-Surgery Clear Ensure           If you have questions, please contact your surgeon's office.   FOLLOW BOWEL PREP AND ANY ADDITIONAL PRE OP INSTRUCTIONS YOU RECEIVED FROM YOUR SURGEON'S OFFICE!!!     Oral Hygiene is also important to reduce your risk of infection.                                    Remember - BRUSH YOUR TEETH THE MORNING OF SURGERY WITH YOUR REGULAR TOOTHPASTE  DENTURES WILL  BE REMOVED PRIOR TO SURGERY PLEASE DO NOT APPLY "Poly grip" OR ADHESIVES!!!   Stop all vitamins and herbal supplements 7 days before surgery.   Take these medicines the morning of surgery with A SIP OF WATER: Tylenol, Allopurinol, Amlodipine, Atorvastatin, Metoprolol, Percocet, Pantoprazole   These are anesthesia recommendations for holding your anticoagulants.  Please contact your prescribing physician to confirm IF it is safe to hold your anticoagulants for this length of time.   Eliquis Apixaban   72 hours   Xarelto Rivaroxaban   72 hours  Plavix Clopidogrel   120 hours  Pletal Cilostazol   120 hours    Bring CPAP mask and tubing day of surgery.                              You may not have any metal on your body including jewelry, and body piercing             Do not wear lotions, powders, cologne, or deodorant              Men may shave face and neck.   Do not bring valuables to the hospital. [place] IS NOT             RESPONSIBLE   FOR VALUABLES.   Contacts, glasses, dentures or bridgework may not be worn into surgery.   Bring small overnight bag day of surgery.   DO NOT BRING YOUR HOME MEDICATIONS TO THE HOSPITAL. PHARMACY WILL DISPENSE MEDICATIONS LISTED ON YOUR MEDICATION LIST TO YOU DURING YOUR ADMISSION IN THE HOSPITAL!              Please read over the following fact sheets you were given: IF YOU HAVE QUESTIONS ABOUT YOUR PRE-OP INSTRUCTIONS PLEASE CALL [name]    If you received a COVID test during your pre-op visit  it is requested that you wear a mask when out in public, stay  away from anyone that may not be feeling well and notify your surgeon if you develop symptoms. If you test positive for Covid or have been in contact with anyone that has tested positive in the last 10 days please notify you surgeon.      Pre-operative 5 CHG Bath Instructions   You can play a key role in reducing the risk of infection after surgery. Your skin needs to be as free of germs as possible. You can reduce the number of germs on your skin by washing with CHG (chlorhexidine gluconate) soap before surgery. CHG is an antiseptic soap that kills germs and continues to kill germs even after washing.   DO NOT use if you have an allergy to chlorhexidine/CHG or antibacterial soaps. If your skin becomes reddened or irritated, stop using the CHG and notify one of our RNs at [phone].   Please shower with the CHG soap starting 4 days before surgery using the following schedule:     Please keep in mind the following:  DO NOT shave, including legs and underarms, starting the day of your first shower.   You may shave your face at any point before/day of surgery.  Place clean sheets on your bed the day you start using CHG soap. Use a clean washcloth (not used since being washed) for each shower. DO NOT sleep with pets once you start using the CHG.   CHG Shower Instructions:  If you choose to wash your hair and private area,  wash first with your normal shampoo/soap.  After you use shampoo/soap, rinse your hair and body thoroughly to remove shampoo/soap residue.  Turn the water OFF and apply about 3 tablespoons (45 ml) of CHG soap to a CLEAN washcloth.  Apply CHG soap ONLY FROM YOUR NECK DOWN TO YOUR TOES (washing for 3-5 minutes)  DO NOT use CHG soap on face, private areas, open wounds, or sores.  Pay special attention to the area where your surgery is being performed.  If you are having back surgery, having someone wash your back for you may be helpful. Wait 2 minutes after CHG soap is  applied, then you may rinse off the CHG soap.  Pat dry with a clean towel  Put on clean clothes/pajamas   If you choose to wear lotion, please use ONLY the CHG-compatible lotions on the back of this paper.     Additional instructions for the day of surgery: DO NOT APPLY any lotions, deodorants, cologne, or perfumes.   Put on clean/comfortable clothes.  Brush your teeth.  Ask your nurse before applying any prescription medications to the skin.      CHG Compatible Lotions   Aveeno Moisturizing lotion  Cetaphil Moisturizing Cream  Cetaphil Moisturizing Lotion  Clairol Herbal Essence Moisturizing Lotion, Dry Skin  Clairol Herbal Essence Moisturizing Lotion, Extra Dry Skin  Clairol Herbal Essence Moisturizing Lotion, Normal Skin  Curel Age Defying Therapeutic Moisturizing Lotion with Alpha Hydroxy  Curel Extreme Care Body Lotion  Curel Soothing Hands Moisturizing Hand Lotion  Curel Therapeutic Moisturizing Cream, Fragrance-Free  Curel Therapeutic Moisturizing Lotion, Fragrance-Free  Curel Therapeutic Moisturizing Lotion, Original Formula  Eucerin Daily Replenishing Lotion  Eucerin Dry Skin Therapy Plus Alpha Hydroxy Crme  Eucerin Dry Skin Therapy Plus Alpha Hydroxy Lotion  Eucerin Original Crme  Eucerin Original Lotion  Eucerin Plus Crme Eucerin Plus Lotion  Eucerin TriLipid Replenishing Lotion  Keri Anti-Bacterial Hand Lotion  Keri Deep Conditioning Original Lotion Dry Skin Formula Softly Scented  Keri Deep Conditioning Original Lotion, Fragrance Free Sensitive Skin Formula  Keri Lotion Fast Absorbing Fragrance Free Sensitive Skin Formula  Keri Lotion Fast Absorbing Softly Scented Dry Skin Formula  Keri Original Lotion  Keri Skin Renewal Lotion Keri Silky Smooth Lotion  Keri Silky Smooth Sensitive Skin Lotion  Nivea Body Creamy Conditioning Oil  Nivea Body Extra Enriched Lotion  Nivea Body Original Lotion  Nivea Body Sheer Moisturizing Lotion Nivea Crme  Nivea Skin  Firming Lotion  NutraDerm 30 Skin Lotion  NutraDerm Skin Lotion  NutraDerm Therapeutic Skin Cream  NutraDerm Therapeutic Skin Lotion  ProShield Protective Hand Cream  Provon moisturizing lotion   Incentive Spirometer  An incentive spirometer is a tool that can help keep your lungs clear and active. This tool measures how well you are filling your lungs with each breath. Taking long deep breaths may help reverse or decrease the chance of developing breathing (pulmonary) problems (especially infection) following: A long period of time when you are unable to move or be active. BEFORE THE PROCEDURE  If the spirometer includes an indicator to show your best effort, your nurse or respiratory therapist will set it to a desired goal. If possible, sit up straight or lean slightly forward. Try not to slouch. Hold the incentive spirometer in an upright position. INSTRUCTIONS FOR USE  Sit on the edge of your bed if possible, or sit up as far as you can in bed or on a chair. Hold the incentive spirometer in an upright position. Breathe out normally.  Place the mouthpiece in your mouth and seal your lips tightly around it. Breathe in slowly and as deeply as possible, raising the piston or the ball toward the top of the column. Hold your breath for 3-5 seconds or for as long as possible. Allow the piston or ball to fall to the bottom of the column. Remove the mouthpiece from your mouth and breathe out normally. Rest for a few seconds and repeat Steps 1 through 7 at least 10 times every 1-2 hours when you are awake. Take your time and take a few normal breaths between deep breaths. The spirometer may include an indicator to show your best effort. Use the indicator as a goal to work toward during each repetition. After each set of 10 deep breaths, practice coughing to be sure your lungs are clear. If you have an incision (the cut made at the time of surgery), support your incision when coughing by placing a  pillow or rolled up towels firmly against it. Once you are able to get out of bed, walk around indoors and cough well. You may stop using the incentive spirometer when instructed by your caregiver.  RISKS AND COMPLICATIONS Take your time so you do not get dizzy or light-headed. If you are in pain, you may need to take or ask for pain medication before doing incentive spirometry. It is harder to take a deep breath if you are having pain. AFTER USE Rest and breathe slowly and easily. It can be helpful to keep track of a log of your progress. Your caregiver can provide you with a simple table to help with this. If you are using the spirometer at home, follow these instructions: SEEK MEDICAL CARE IF:  You are having difficultly using the spirometer. You have trouble using the spirometer as often as instructed. Your pain medication is not giving enough relief while using the spirometer. You develop fever of 100.5 F (38.1 C) or higher. SEEK IMMEDIATE MEDICAL CARE IF:  You cough up bloody sputum that had not been present before. You develop fever of 102 F (38.9 C) or greater. You develop worsening pain at or near the incision site. MAKE SURE YOU:  Understand these instructions. Will watch your condition. Will get help right away if you are not doing well or get worse. Document Released: 08/10/2006 Document Revised: 06/22/2011 Document Reviewed: 10/11/2006 [facility] Patient Information 2014 [place], [place].   ________________________________________________________________________

## 2023-04-08 ENCOUNTER — Encounter (HOSPITAL_COMMUNITY): Payer: Self-pay

## 2023-04-08 ENCOUNTER — Encounter (HOSPITAL_COMMUNITY)
Admission: RE | Admit: 2023-04-08 | Discharge: 2023-04-08 | Disposition: A | Discharge status: Home / Self Care | Payer: Medicare HMO | Source: Ambulatory Visit | Attending: Orthopedic Surgery | Admitting: Orthopedic Surgery

## 2023-04-08 ENCOUNTER — Other Ambulatory Visit: Payer: Self-pay

## 2023-04-08 VITALS — BP 121/80 | HR 63 | Temp 97.9°F | Resp 16 | Ht 66.0 in | Wt 243.0 lb

## 2023-04-08 DIAGNOSIS — Z01818 Encounter for other preprocedural examination: Secondary | ICD-10-CM

## 2023-04-08 DIAGNOSIS — Z01812 Encounter for preprocedural laboratory examination: Secondary | ICD-10-CM | POA: Diagnosis present

## 2023-04-08 DIAGNOSIS — I4891 Unspecified atrial fibrillation: Secondary | ICD-10-CM | POA: Insufficient documentation

## 2023-04-08 DIAGNOSIS — Z87891 Personal history of nicotine dependence: Secondary | ICD-10-CM | POA: Insufficient documentation

## 2023-04-08 DIAGNOSIS — K219 Gastro-esophageal reflux disease without esophagitis: Secondary | ICD-10-CM | POA: Diagnosis not present

## 2023-04-08 DIAGNOSIS — M1611 Unilateral primary osteoarthritis, right hip: Secondary | ICD-10-CM | POA: Insufficient documentation

## 2023-04-08 DIAGNOSIS — Z79899 Other long term (current) drug therapy: Secondary | ICD-10-CM | POA: Diagnosis not present

## 2023-04-08 DIAGNOSIS — I351 Nonrheumatic aortic (valve) insufficiency: Secondary | ICD-10-CM | POA: Diagnosis not present

## 2023-04-08 DIAGNOSIS — N183 Chronic kidney disease, stage 3 unspecified: Secondary | ICD-10-CM | POA: Diagnosis not present

## 2023-04-08 DIAGNOSIS — Z7901 Long term (current) use of anticoagulants: Secondary | ICD-10-CM | POA: Diagnosis not present

## 2023-04-08 DIAGNOSIS — I4892 Unspecified atrial flutter: Secondary | ICD-10-CM | POA: Diagnosis not present

## 2023-04-08 DIAGNOSIS — I2510 Atherosclerotic heart disease of native coronary artery without angina pectoris: Secondary | ICD-10-CM | POA: Insufficient documentation

## 2023-04-08 DIAGNOSIS — R6889 Other general symptoms and signs: Secondary | ICD-10-CM | POA: Diagnosis not present

## 2023-04-08 DIAGNOSIS — I129 Hypertensive chronic kidney disease with stage 1 through stage 4 chronic kidney disease, or unspecified chronic kidney disease: Secondary | ICD-10-CM | POA: Insufficient documentation

## 2023-04-08 LAB — SURGICAL PCR SCREEN
MRSA, PCR: NEGATIVE
Staphylococcus aureus: NEGATIVE

## 2023-04-09 NOTE — Anesthesia Preprocedure Evaluation (Signed)
Anesthesia Evaluation    Airway        Dental   Pulmonary former smoker          Cardiovascular hypertension,      Neuro/Psych    GI/Hepatic   Endo/Other    Renal/GU      Musculoskeletal   Abdominal   Peds  Hematology   Anesthesia Other Findings   Reproductive/Obstetrics                              Anesthesia Physical Anesthesia Plan  ASA:   Anesthesia Plan:    Post-op Pain Management:    Induction:   PONV Risk Score and Plan:   Airway Management Planned:   Additional Equipment:   Intra-op Plan:   Post-operative Plan:   Informed Consent:   Plan Discussed with:   Anesthesia Plan Comments: (See PAT note from 12/26 by Sherlie Ban PA-C )         Anesthesia Quick Evaluation

## 2023-04-09 NOTE — H&P (Incomplete)
HIP ARTHROPLASTY ADMISSION H&P  Patient ID: Calvin Henry MRN: [id number] DOB/AGE: 12/13/1960 62 y.o.  Chief Complaint: right hip pain.  Planned Procedure Date: 04/20/23 Medical Clearance by Terie Purser PA-C   Cardiac Clearance by Dr. Diona Browner   HPI: Calvin Henry is a 62 y.o. male who presents for evaluation of OA RIGHT HIP. The patient has a history of pain and functional disability in the right hip due to arthritis and has failed non-surgical conservative treatments for greater than 12 weeks to include {nonsurgical conservative treatment (must select two):3049030}.  Onset of symptoms was {abrupt, gradual:20671}, starting {1->10 years:3049031} years ago with {stable, gradual worsening, rapidly worsening:3049032} course since that time. The patient noted {no past surgery, prior procedures:3049033} on the right hip.  Patient currently rates pain at {1-10:3049035} out of 10 with activity. Patient has night pain, worsening of pain with activity and weight bearing, and pain that interferes with activities of daily living.  Patient has evidence of {Radiographic or MRI evidence of (must document one of the below):3046104} by imaging studies.  There is no active infection.  Past Medical History:  Diagnosis Date   A-fib (HCC)    Acid reflux    Arthritis    Atrial flutter (HCC)    a. diagnosed in 02/2017 with spontaneous conversion back to NSR.    CAD (coronary artery disease)    Chronic back pain    CKD (chronic kidney disease) stage 3, GFR 30-59 ml/min (HCC)    Essential hypertension    H. pylori infection    Treated with Prevpac   Hemorrhoids    PSVT (paroxysmal supraventricular tachycardia) (HCC)    a. s/p ablation in 12/2016   Restrictive cardiomyopathy (HCC)    Tubular adenoma    Past Surgical History:  Procedure Laterality Date   BIOPSY  11/26/2022   Procedure: BIOPSY;  Surgeon: Corbin Ade, MD;  [facility]: [facility];  Service: Endoscopy;;   COLONOSCOPY   08/07/10   friable anal canal otherwise normal   COLONOSCOPY [name] 03/18/2017   Procedure: COLONOSCOPY;  Surgeon: Corbin Ade, MD;  Location: [facility];  Service: Endoscopy;  Laterality: [name];  1:15pm   COLONOSCOPY WITH ESOPHAGOGASTRODUODENOSCOPY (EGD) [name] 07/06/2013   Dr. Jena Gauss- inflamed hemorrhoids- multiple colonic polyps= tubular adenoma. stomach bx= hpylori treated with prevpac   COLONOSCOPY WITH PROPOFOL [name] 11/26/2022   Procedure: COLONOSCOPY WITH PROPOFOL;  Surgeon: Corbin Ade, MD;  Location: [facility];  Service: Endoscopy;  Laterality: [name];  10:00am, asa 3   ESOPHAGOGASTRODUODENOSCOPY  08/07/10   schatzkis ring otherwise normal, s/p 56-F dilation, small hiatal hernia.chronic active gastritis on bx   ESOPHAGOGASTRODUODENOSCOPY (EGD) WITH PROPOFOL [name] 11/26/2022   Procedure: ESOPHAGOGASTRODUODENOSCOPY (EGD) WITH PROPOFOL;  Surgeon: Corbin Ade, MD;  [facility];  Service: Endoscopy;  Laterality: [name];   HEMORRHOID BANDING     HERNIA REPAIR     HYDROCELE EXCISION Bilateral 08/20/2022   Procedure: HYDROCELECTOMY ADULT;  Surgeon: Malen Gauze, MD;  Location: [facility] OR[facility];  Service: Urology;  Laterality: Bilateral;   MALONEY DILATION [name] 11/26/2022   Procedure: Elease Hashimoto DILATION;  Surgeon: Corbin Ade, MD;  Location: [facility];  Service: Endoscopy;  Laterality: [name];   POLYPECTOMY  11/26/2022   Procedure: POLYPECTOMY;  Surgeon: Corbin Ade, MD;  [facility]: [facility];  Service: Endoscopy;;   SVT ABLATION [name] 12/15/2016   Procedure: SVT Ablation;  Surgeon: Hillis Range, MD;  Location: [facility];  Service: Cardiovascular;  Laterality: [name];   TOTAL HIP ARTHROPLASTY Left 10/04/2015   Procedure: LEFT TOTAL HIP ARTHROPLASTY ANTERIOR APPROACH;  Surgeon: Kathryne Hitch, MD;  Location: [facility];  Service: Orthopedics;  Laterality: Left;   UMBILICAL HERNIA REPAIR  08/05/2011   Procedure: HERNIA REPAIR UMBILICAL ADULT;  Surgeon: Dalia Heading, MD;   Location: [facility];  Service: General;  Laterality: [name];   No Known Allergies Prior to Admission medications   Medication Sig Start Date End Date Taking? Authorizing Provider  acetaminophen (TYLENOL) 500 MG tablet Take 1,000 mg by mouth every 6 (six) hours as needed for moderate pain.   Yes [provider]  allopurinol (ZYLOPRIM) 100 MG tablet Take 100 mg by mouth daily. 07/20/22  Yes [provider]  amLODipine (NORVASC) 5 MG tablet Take 5 mg by mouth daily. 12/19/21  Yes [provider]  apixaban (ELIQUIS) 5 MG TABS tablet Take 5 mg by mouth 2 (two) times daily. 10/08/21  Yes [provider]  atorvastatin (LIPITOR) 80 MG tablet Take 80 mg by mouth daily. 09/17/20  Yes [provider]  chlorthalidone (HYGROTON) 25 MG tablet Take 12.5 mg by mouth every Monday, Wednesday, and Friday.   Yes [provider]  Cholecalciferol (VITAMIN D3 SUPER STRENGTH) 50 MCG (2000 UT) CAPS Take 1 capsule by mouth daily.   Yes [provider]  furosemide (LASIX) 20 MG tablet Take 20 mg by mouth daily.   Yes [provider]  metoprolol succinate (TOPROL-XL) 50 MG 24 hr tablet TAKE 1 TABLET BY MOUTH DAILY 09/21/18  Yes Jonelle Sidle, MD  olmesartan (BENICAR) 20 MG tablet Take 20 mg by mouth daily.   Yes [provider]  oxyCODONE-acetaminophen (PERCOCET) 10-325 MG tablet Take 1 tablet by mouth 4 (four) times daily as needed for pain.   Yes [provider]  pantoprazole (PROTONIX) 40 MG tablet Take 1 tablet (40 mg total) by mouth daily. 11/26/22  Yes Rourk, [name], MD  testosterone cypionate (DEPOTESTOSTERONE CYPIONATE) 200 MG/ML injection Inject 1 mL (200 mg total) into the muscle every 14 (fourteen) days. Patient taking differently: Inject 200 mg into the muscle every 28 (twenty-eight) days. 12/29/22  Yes McKenzie, Mardene Celeste, MD  NEEDLE, DISP, 18 G 18G X 1-1/2" MISC Use to draw up testosterone 12/29/22   McKenzie, Mardene Celeste, MD   SYRINGE-NEEDLE, DISP, 3 ML 22G X 1-1/2" 3 ML MISC Use to inject testosterone 12/29/22   McKenzie, Mardene Celeste, MD   Social History   Socioeconomic History   Marital status: Divorced    Spouse name: Not on file   Number of children: Not on file   Years of education: Not on file   Highest education level: Not on file  Occupational History   Not on file  Tobacco Use   Smoking status: Former    Current packs/day: 0.00    Average packs/day: 0.5 packs/day for 10.0 years (5.0 ttl pk-yrs)    Types: Cigars, Cigarettes    Start date: 11/30/1976    Quit date: 12/01/1986    Years since quitting: 36.3   Smokeless tobacco: Never   Tobacco comments:    Quit x 20 plus years  Vaping Use   Vaping status: Never Used  Substance and Sexual Activity   Alcohol use: Not Currently   Drug use: No    Comment: remote marijuana use    Sexual activity: Yes  Other Topics Concern   Not on file  Social History Narrative   ** Merged History  Encounter **       Lives in [place] 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   Social Drivers of Health   Financial Resource Strain: Patient Declined (05/11/2022)   Received from [employer], [facility]   Overall Financial Resource Strain (CARDIA)    Difficulty of Paying Living Expenses: Patient declined  Food Insecurity: Patient Declined (05/11/2022)   Received from [facility], [facility]   Hunger Vital Sign    Worried About Running Out of Food in the Last Year: Patient declined    Ran Out of Food in the Last Year: Patient declined  Transportation Needs: Patient Declined (05/11/2022)   Received from [facility], [facility]   PRAPARE - Transportation    Lack of Transportation (Medical): Patient declined    Lack of Transportation (Non-Medical): Patient declined  Physical Activity: Inactive (10/19/2021)   Received from [facility], [facility]   Exercise Vital Sign    Days of Exercise per Week: 0 days    Minutes of Exercise per Session: 0 min  Stress: No  Stress Concern Present (10/19/2021)   Received from [facility], [facility] - Occupational Stress Questionnaire    Feeling of Stress : Not at all  Social Connections: Unknown (10/25/2022)   Received from [facility]   Social Network    Social Network: Not on file   Family History  Problem Relation Age of Onset   Diabetes Mother    Hypertension Mother    Colon cancer Neg Hx     ROS: Currently denies lightheadedness, dizziness, Fever, chills, CP, SOB.   No personal history of DVT, PE, MI, or CVA.*** No loose teeth or dentures*** All other systems have been reviewed and were otherwise currently negative with the exception of those mentioned in the HPI and as above.  Objective: Vitals: Ht: *** Wt: *** lbs Temp: *** BP: *** Pulse: *** O2 ***% on room air.   Physical Exam: General: Alert, NAD. Trendelenberg Gait  HEENT: EOMI, Good Neck Extension  Pulm: No increased work of breathing.  Clear B/L A/P w/o crackle or wheeze.  CV: Reg rate with irregular rhythm, No m/g/r appreciated  GI: soft, NT, ND. BS x 4 quadrants Neuro: CN II-XII grossly intact without focal deficit.  Sensation intact distally Skin: No lesions in the area of chief complaint MSK/Surgical Site: + TTP. Hip ROM limited d/t pain. + Stinchfield. + SLR. + FABER/FADIR. *** 5/5 strength.  NVI.    Imaging Review Plain radiographs demonstrate {mild/mod/severe:3049053} degenerative joint disease of the right hip.   The bone quality appears to be fair for age and reported activity level.  Preoperative templating of the joint replacement has been completed, documented, and submitted to the Operating Room personnel in order to optimize intra-operative equipment management.  Assessment: OA RIGHT HIP Active Problems:   * No active hospital problems. *   Plan: Plan for Procedure(s): TOTAL HIP ARTHROPLASTY  The patient history, physical exam, clinical judgement of the provider  and imaging are consistent with end stage degenerative joint disease and total joint arthroplasty is deemed medically necessary. The treatment options including medical management, injection therapy, and arthroplasty were discussed at length. The risks and benefits of Procedure(s): TOTAL HIP ARTHROPLASTY were presented and reviewed.  The risks of nonoperative treatment, versus surgical intervention including but not limited to continued pain, aseptic loosening, stiffness, dislocation/subluxation, infection, bleeding, nerve injury, blood clots, cardiopulmonary complications, morbidity, mortality, among others were discussed. The patient verbalizes understanding  and wishes to proceed with the plan.  Patient is being admitted for surgery, pain control, PT, prophylactic antibiotics, VTE prophylaxis, progressive ambulation, ADL's and discharge planning. He will spend the night in observation.  Dental prophylaxis discussed and recommended for 2 years postoperatively.  The patient does meet the criteria for TXA which will be used perioperatively.   His normal daily dose of Eliquis will be used postoperatively for DVT prophylaxis in addition to SCDs, and early ambulation. Plan for Oxycodone,***Celebrex, Tylenol for pain.   Robaxin for muscle spasm.  Zofran for nausea and vomiting. ***Colace Miralax Senokot is for constipation prevention. Pharmacy- *** The patient is planning to be discharged home with OPPT and into the care of his wife Tamika who can be reached at [phone] Follow up appt ***     Marzetta Board Office [phone] 04/09/2023 3:56 PM

## 2023-04-09 NOTE — Progress Notes (Signed)
DISCUSSION: Calvin Henry is a 62 yo male who presents to PAT prior to right TOTAL HIP ARTHROPLASTY on 04/19/22 with Dr. Eulah Pont for osteoarthritis. PMH of former smoking, HTN, CAD, PSVT s/p ablation (2018), A.fib/flutter on Eliquis, mild-mod AI, GERD, CKD (baseline SCr 1.4-1.6)  Patient follows with Cardiology at [facility]. He was diagnosed with A.fib in 2021. He had an ablation on 05/26/2022 and DCCV in 06/2022 for persistent A.fib. He is on Eliquis and rate controlled with BB. He had a LHC in 2021 due to chest pain and abnormal stress test which showed non obstructive CAD. Last echo in 10/2021 showed normal EF with mild-mod AI. Due to increased LV thickness a Cardiac MRI was done in 05/2022 which did not show a restrictive cardiomyopathy. Last seen on 03/17/23. BP noted to be on the low side but not significant changes made to BP regimen. He was cleared for surgery:  "Preoperative cardiovascular examination - Planning for Right TKA - Able to perform 4 METs - Risk is considered acceptable/non-prohibitive - Okay to hold Eliquis 72 hours prior to the procedure (CKD) and resume ASAP cleared post surgery; BB therapy should be continued perioperatively without interruption"  Patient follows with PCP. Last seen 02/19/23 for hip pain. Medical clearance signed that he is optimized (in medica tab).   Blood Thinner Instructions: Eliquis, hold 72 hours  VS: BP 121/80   Pulse 63   Temp 36.6 C (Oral)   Resp 16   Ht 5\' 6"  (1.676 m)   Wt 110.2 kg   SpO2 99%   BMI 39.22 kg/m   PROVIDERS: Avis Epley, PA-C   LABS: Labs reviewed: Acceptable for surgery. (all labs ordered are listed, but only abnormal results are displayed)  Labs Reviewed  SURGICAL PCR SCREEN     IMAGES:   EKG:   CV:  Cardiac MRI 05/18/2022 [facility]):  IMPRESSION:  No myocardial scarring/fibrosis. No findings to suggest restrictive cardiomyopathy.  Normal size left ventricle (Index LVEDV is 65 mL/m2) with normal myocardial  thickness, preserved left ventricular function and calculated LVEF of 56%.  Mild aortic valve regurgitation.  Echo 10/24/2021 [facility]):  Summary   1. The left ventricle is normal in size with mildly increased wall  thickness.   2. The left ventricular systolic function is normal, LVEF is visually  estimated at > 55%.    3. The right ventricle is mildly dilated in size, with normal systolic  function.   4. The aortic valve is trileaflet with mildly thickened leaflets with normal  excursion.   5. There is mild to moderate aortic regurgitation (Eccentric jet).    6. The left atrium is mildly to moderately dilated in size.     LHC 06/15/2019 [facility]):  FINDINGS  Hemodynamics and Left Heart Catheterization Aortic pressure: 167/99 mm Hg (mean 122 mm Hg)  Coronary Angiography Dominance: Right  Left Main:  The left main coronary artery (LMCA) is a large-caliber vessel that originates from the left coronary sinus. It bifurcates into the left anterior descending (LAD) and left circumflex (LCx) arteries. There is no angiographic evidence of significant disease in the LMCA.  LAD: The LAD is a large-caliber vessel that gives off four diagonal (D) branches before it wraps around the apex. D1 is a moderate-caliber vessel. D2 is a moderate-caliber vessel. D3-D4 are small-caliber vessels. There is no angiographic evidence of significant disease in the LAD.  Left Circumflex: The LCx is a large-caliber vessel that gives off two obtuse marginal (OM) branches and then continues as a  small vessel in the AV groove. OM1 is a large-caliber branching vessel. OM2 is a moderate-caliber vessel. There is no angiographic evidence of significant disease in the LCx.  Right Coronary: The right coronary artery (RCA) is a large-caliber vessel originating from the right coronary sinus. It bifurcates distally into the posterior descending artery (PDA) and a posterolateral (PL) branch consistent with a right  dominant system. There is mild non-obstructive CAD with stenosis up to 10% in the RCA.  LCA:    RCA:   Complications:  None Blood loss:  Minimal Specimen:  None Device/Implants:  None  Pre-procedure Dx:  Chest pain, abnormal nuclear stress test Post-procedure Dx:  Atypical chest pain   Past Medical History:  Diagnosis Date   A-fib (HCC)    Acid reflux    Arthritis    Atrial flutter (HCC)    a. diagnosed in 02/2017 with spontaneous conversion back to NSR.    CAD (coronary artery disease)    Chronic back pain    CKD (chronic kidney disease) stage 3, GFR 30-59 ml/min (HCC)    Essential hypertension    H. pylori infection    Treated with Prevpac   Hemorrhoids    PSVT (paroxysmal supraventricular tachycardia) (HCC)    a. s/p ablation in 12/2016   Restrictive cardiomyopathy (HCC)    Tubular adenoma     Past Surgical History:  Procedure Laterality Date   BIOPSY  11/26/2022   Procedure: BIOPSY;  Surgeon: Corbin Ade, MD;  [facility];  Service: Endoscopy;;   COLONOSCOPY  08/07/10   friable anal canal otherwise normal   COLONOSCOPY [name] 03/18/2017   Procedure: COLONOSCOPY;  Surgeon: Corbin Ade, MD;  [facility];  Service: Endoscopy;  Laterality: [name];  1:15pm   COLONOSCOPY WITH ESOPHAGOGASTRODUODENOSCOPY (EGD) [name] 07/06/2013   Dr. Jena Gauss- inflamed hemorrhoids- multiple colonic polyps= tubular adenoma. stomach bx= hpylori treated with prevpac   COLONOSCOPY WITH PROPOFOL [name] 11/26/2022   Procedure: COLONOSCOPY WITH PROPOFOL;  Surgeon: Corbin Ade, MD;  [facility];  Service: Endoscopy;  Laterality: [name];  10:00am, asa 3   ESOPHAGOGASTRODUODENOSCOPY  08/07/10   schatzkis ring otherwise normal, s/p 56-F dilation, small hiatal hernia.chronic active gastritis on bx   ESOPHAGOGASTRODUODENOSCOPY (EGD) WITH PROPOFOL [name] 11/26/2022   Procedure: ESOPHAGOGASTRODUODENOSCOPY (EGD) WITH PROPOFOL;  Surgeon: Corbin Ade, MD;  [facility];  Service: Endoscopy;  Laterality: [name];   HEMORRHOID BANDING     HERNIA REPAIR     HYDROCELE EXCISION Bilateral 08/20/2022   Procedure: HYDROCELECTOMY ADULT;  Surgeon: Malen Gauze, MD;  Location: [facility];  Service: Urology;  Laterality: Bilateral;   MALONEY DILATION [name] 11/26/2022   Procedure: Elease Hashimoto DILATION;  Surgeon: Corbin Ade, MD;  [facility];  Service: Endoscopy;  Laterality: [name];   POLYPECTOMY  11/26/2022   Procedure: POLYPECTOMY;  Surgeon: Corbin Ade, MD;  [facility];  Service: Endoscopy;;   SVT ABLATION [name] 12/15/2016   Procedure: SVT Ablation;  Surgeon: Hillis Range, MD;  [facility];  Service: Cardiovascular;  Laterality: [name];   TOTAL HIP ARTHROPLASTY Left 10/04/2015   Procedure: LEFT TOTAL HIP ARTHROPLASTY ANTERIOR APPROACH;  Surgeon: Kathryne Hitch, MD;  Location: [facility];  Service: Orthopedics;  Laterality: Left;   UMBILICAL HERNIA REPAIR  08/05/2011   Procedure: HERNIA REPAIR UMBILICAL ADULT;  Surgeon: Dalia Heading, MD;  Location: [facility];  Service: General;  Laterality: [name];    MEDICATIONS:  acetaminophen (TYLENOL) 500 MG tablet   allopurinol (ZYLOPRIM) 100 MG tablet   amLODipine (NORVASC) 5 MG tablet   apixaban (ELIQUIS) 5 MG TABS tablet   atorvastatin (LIPITOR) 80 MG tablet   chlorthalidone (HYGROTON) 25 MG tablet   Cholecalciferol (VITAMIN D3 SUPER STRENGTH) 50 MCG (2000 UT) CAPS   furosemide (LASIX) 20 MG tablet   metoprolol succinate (TOPROL-XL) 50 MG 24 hr tablet   NEEDLE, DISP, 18 G 18G X 1-1/2" MISC   olmesartan (BENICAR) 20 MG tablet   oxyCODONE-acetaminophen (PERCOCET) 10-325 MG tablet   pantoprazole (PROTONIX) 40 MG tablet   SYRINGE-NEEDLE, DISP, 3 ML 22G X 1-1/2" 3 ML MISC   testosterone cypionate (DEPOTESTOSTERONE CYPIONATE) 200 MG/ML injection   No current facility-administered medications for this encounter.   Marcille Blanco [facility] Surgical Short Stay/Anesthesiology [facility] Phone  [phone] 04/09/2023 11:13 AM

## 2023-04-13 DIAGNOSIS — I10 Essential (primary) hypertension: Secondary | ICD-10-CM | POA: Diagnosis not present

## 2023-04-13 DIAGNOSIS — D6869 Other thrombophilia: Secondary | ICD-10-CM | POA: Diagnosis not present

## 2023-04-13 DIAGNOSIS — I4819 Other persistent atrial fibrillation: Secondary | ICD-10-CM | POA: Diagnosis not present

## 2023-04-13 DIAGNOSIS — E782 Mixed hyperlipidemia: Secondary | ICD-10-CM | POA: Diagnosis not present

## 2023-04-15 NOTE — Care Plan (Signed)
 Ortho Bundle Case Management Note  Patient Details  Name: Calvin Henry MRN: [id number] Date of Birth: 05/18/1960  seen in the office for H&P. will discharge to home with family to assist. rolling walker ordered for home use. OPPT set up with [facility]. discharge instructions discussed and questions answered.                    DME Arranged:  Vannie rolling DME Agency:  [employer]  HH Arranged:    HH Agency:     Additional Comments: Please contact me with any questions of if this plan should need to change.  Charlies Pitch,  RN,BSN,MHA,CCM  [facility]opaedic Specialist  [phone] 04/15/2023, 8:28 AM

## 2023-04-16 ENCOUNTER — Ambulatory Visit: Payer: Medicare HMO | Admitting: Urology

## 2023-04-16 ENCOUNTER — Telehealth: Payer: Self-pay

## 2023-04-16 VITALS — BP 116/74 | HR 62

## 2023-04-16 DIAGNOSIS — E291 Testicular hypofunction: Secondary | ICD-10-CM

## 2023-04-16 LAB — URINALYSIS, ROUTINE W REFLEX MICROSCOPIC
Bilirubin, UA: NEGATIVE
Glucose, UA: NEGATIVE
Ketones, UA: NEGATIVE
Leukocytes,UA: NEGATIVE
Nitrite, UA: NEGATIVE
Protein,UA: NEGATIVE
RBC, UA: NEGATIVE
Specific Gravity, UA: 1.015 (ref 1.005–1.030)
Urobilinogen, Ur: 0.2 mg/dL (ref 0.2–1.0)
pH, UA: 6 (ref 5.0–7.5)

## 2023-04-16 MED ORDER — TESTOSTERONE CYPIONATE 200 MG/ML IM SOLN: 200.0000 mg | INTRAMUSCULAR | 1 refills | Status: DC

## 2023-04-16 MED ORDER — TESTOSTERONE CYPIONATE 200 MG/ML IM SOLN
200.0000 mg | Freq: Once | INTRAMUSCULAR | Status: AC
  Administered 2023-04-16: 200 mg via INTRAMUSCULAR

## 2023-04-16 NOTE — Telephone Encounter (Signed)
 PA started: (Key: [id number])

## 2023-04-16 NOTE — Progress Notes (Signed)
 IM injection  Medication: Testosterone  Dose: 100 Location: right Lot: [id number] Exp: 01/2025  Patient tolerated well, no complications were noted  Performed by: Guss Bunde, CMA

## 2023-04-16 NOTE — Progress Notes (Signed)
 04/16/2023 9:42 AM   Quintin KANDICE Gearing 06-Sep-1960 [id number]  Referring provider: Leonce Lucie PARAS, PA-C [address] [place],  [place] [address]  Followup hypogonadism   HPI: Mr Lafoe is a 63yo here for followup for hypogonadism. He has stopped his IM testosterone  in the past month. His PCP switch him to IM testosterone  200mg  every 28 days. He is having hot flashes. Energy with fair. Libido has significantly decreased.    PMH: Past Medical History:  Diagnosis Date   A-fib (HCC)    Acid reflux    Arthritis    Atrial flutter (HCC)    a. diagnosed in 02/2017 with spontaneous conversion back to NSR.    CAD (coronary artery disease)    Chronic back pain    CKD (chronic kidney disease) stage 3, GFR 30-59 ml/min (HCC)    Essential hypertension    H. pylori infection    Treated with Prevpac   Hemorrhoids    PSVT (paroxysmal supraventricular tachycardia) (HCC)    a. s/p ablation in 12/2016   Restrictive cardiomyopathy (HCC)    Tubular adenoma     Surgical History: Past Surgical History:  Procedure Laterality Date   BIOPSY  11/26/2022   Procedure: BIOPSY;  Surgeon: Shaaron Lamar HERO, MD;  [facility];  Service: Endoscopy;;   COLONOSCOPY  08/07/10   friable anal canal otherwise normal   COLONOSCOPY [name] 03/18/2017   Procedure: COLONOSCOPY;  Surgeon: Shaaron Lamar HERO, MD;  [facility];  Service: Endoscopy;  Laterality: [name];  1:15pm   COLONOSCOPY WITH ESOPHAGOGASTRODUODENOSCOPY (EGD) [name] 07/06/2013   Dr. Shaaron- inflamed hemorrhoids- multiple colonic polyps= tubular adenoma. stomach bx= hpylori treated with prevpac   COLONOSCOPY WITH PROPOFOL  [name] 11/26/2022   Procedure: COLONOSCOPY WITH PROPOFOL ;  Surgeon: Shaaron Lamar HERO, MD;  [facility];  Service: Endoscopy;  Laterality: [name];  10:00am, asa 3   ESOPHAGOGASTRODUODENOSCOPY  08/07/10   schatzkis ring otherwise normal, s/p 56-F dilation, small hiatal hernia.chronic active gastritis on bx    ESOPHAGOGASTRODUODENOSCOPY (EGD) WITH PROPOFOL  [name] 11/26/2022   Procedure: ESOPHAGOGASTRODUODENOSCOPY (EGD) WITH PROPOFOL ;  Surgeon: Shaaron Lamar HERO, MD;  [facility];  Service: Endoscopy;  Laterality: [name];   HEMORRHOID BANDING     HERNIA REPAIR     HYDROCELE EXCISION Bilateral 08/20/2022   Procedure: HYDROCELECTOMY ADULT;  Surgeon: Sherrilee Belvie CROME, MD;  Location: [facility];  Service: Urology;  Laterality: Bilateral;   MALONEY DILATION [name] 11/26/2022   Procedure: AGAPITO DILATION;  Surgeon: Shaaron Lamar HERO, MD;  [facility];  Service: Endoscopy;  Laterality: [name];   POLYPECTOMY  11/26/2022   Procedure: POLYPECTOMY;  Surgeon: Shaaron Lamar HERO, MD;  [facility];  Service: Endoscopy;;   SVT ABLATION [name] 12/15/2016   Procedure: SVT Ablation;  Surgeon: Kelsie Agent, MD;  [facility];  Service: Cardiovascular;  Laterality: [name];   TOTAL HIP ARTHROPLASTY Left 10/04/2015   Procedure: LEFT TOTAL HIP ARTHROPLASTY ANTERIOR APPROACH;  Surgeon: Lonni CINDERELLA Poli, MD;  [facility]: [facility];  Service: Orthopedics;  Laterality: Left;   UMBILICAL HERNIA REPAIR  08/05/2011   Procedure: HERNIA REPAIR UMBILICAL ADULT;  Surgeon: Oneil DELENA Budge, MD;  Location: [facility];  Service: General;  Laterality: [name];    Home Medications:  Allergies as of 04/16/2023   No Known Allergies      Medication List        Accurate as of April 16, 2023  9:42 AM. If you have any questions,  ask your nurse or doctor.          acetaminophen  500 MG tablet Commonly known as: TYLENOL  Take 1,000 mg by mouth every 6 (six) hours as needed for moderate pain.   allopurinol  100 MG tablet Commonly known as: ZYLOPRIM  Take 100 mg by mouth daily.   amLODipine  5 MG tablet Commonly known as: NORVASC  Take 5 mg by mouth daily.   apixaban  5 MG Tabs tablet Commonly known as: ELIQUIS  Take 5 mg by mouth 2 (two) times daily.   atorvastatin  80 MG tablet Commonly known as: LIPITOR Take 80 mg by  mouth daily.   chlorthalidone 25 MG tablet Commonly known as: HYGROTON Take 12.5 mg by mouth every Monday, Wednesday, and Friday.   furosemide  20 MG tablet Commonly known as: LASIX  Take 20 mg by mouth daily.   metoprolol  succinate 50 MG 24 hr tablet Commonly known as: TOPROL -XL TAKE 1 TABLET BY MOUTH DAILY   NEEDLE (DISP) 18 G 18G X 1-1/2 Misc Use to draw up testosterone    olmesartan 20 MG tablet Commonly known as: BENICAR Take 20 mg by mouth daily.   oxyCODONE -acetaminophen  10-325 MG tablet Commonly known as: PERCOCET Take 1 tablet by mouth 4 (four) times daily as needed for pain.   pantoprazole  40 MG tablet Commonly known as: PROTONIX  Take 1 tablet (40 mg total) by mouth daily.   SYRINGE-NEEDLE (DISP) 3 ML 22G X 1-1/2 3 ML Misc Use to inject testosterone    testosterone  cypionate 200 MG/ML injection Commonly known as: DEPOTESTOSTERONE CYPIONATE Inject 1 mL (200 mg total) into the muscle every 14 (fourteen) days. What changed: when to take this   Vitamin D3 Super Strength 50 MCG (2000 UT) Caps Generic drug: Cholecalciferol Take 1 capsule by mouth daily.        Allergies: No Known Allergies  Family History: Family History  Problem Relation Age of Onset   Diabetes Mother    Hypertension Mother    Colon cancer Neg Hx     Social History:  reports that he quit smoking about 36 years ago. His smoking use included cigars and cigarettes. He started smoking about 46 years ago. He has a 5 pack-year smoking history. He has never used smokeless tobacco. He reports that he does not currently use alcohol. He reports that he does not use drugs.  ROS: All other review of systems were reviewed and are negative except what is noted above in HPI  Physical Exam: BP 116/74   Pulse 62   Constitutional:  Alert and oriented, No acute distress. HEENT: [place] AT, moist mucus membranes.  Trachea midline, no masses. Cardiovascular: No clubbing, cyanosis, or edema. Respiratory:  Normal respiratory effort, no increased work of breathing. GI: Abdomen is soft, nontender, nondistended, no abdominal masses GU: No CVA tenderness.  Lymph: No cervical or inguinal lymphadenopathy. Skin: No rashes, bruises or suspicious lesions. Neurologic: Grossly intact, no focal deficits, moving all 4 extremities. Psychiatric: Normal mood and affect.  Laboratory Data: Lab Results  Component Value Date   WBC 5.1 03/30/2023   HGB 16.1 03/30/2023   HCT 49.6 03/30/2023   MCV 92 03/30/2023   PLT 217 03/30/2023    Lab Results  Component Value Date   CREATININE 1.60 (H) 03/30/2023    No results found for: PSA  Lab Results  Component Value Date   TESTOSTERONE  111 (L) 03/30/2023    No results found for: HGBA1C  Urinalysis    Component Value Date/Time   COLORURINE YELLOW 08/17/2013 1403   APPEARANCEUR  Clear 05/13/2022 0853   LABSPEC 1.020 08/17/2013 1403   PHURINE 5.5 08/17/2013 1403   GLUCOSEU Negative 05/13/2022 0853   HGBUR NEGATIVE 08/17/2013 1403   BILIRUBINUR Negative 05/13/2022 0853   KETONESUR NEGATIVE 08/17/2013 1403   PROTEINUR Negative 05/13/2022 0853   PROTEINUR NEGATIVE 08/17/2013 1403   UROBILINOGEN 0.2 08/17/2013 1403   NITRITE Negative 05/13/2022 0853   NITRITE NEGATIVE 08/17/2013 1403   LEUKOCYTESUR Negative 05/13/2022 0853    Lab Results  Component Value Date   LABMICR Comment 05/13/2022    Pertinent Imaging:  No results found for this or any previous visit.  No results found for this or any previous visit.  No results found for this or any previous visit.  No results found for this or any previous visit.  No results found for this or any previous visit.  No results found for this or any previous visit.  No results found for this or any previous visit.  No results found for this or any previous visit.   Assessment & Plan:    1. Hypogonadism male (Primary) -We will restart IM testostoenr 200mg  every 2 week. Followup 3 months  with labs - Urinalysis, Routine w reflex microscopic   No follow-ups on file.  Belvie Clara, MD  [facility] [place]

## 2023-04-20 ENCOUNTER — Observation Stay (HOSPITAL_COMMUNITY)
Admission: RE | Admit: 2023-04-20 | Discharge: 2023-04-21 | Disposition: A | Discharge status: Home / Self Care | Payer: Medicare HMO | Source: Ambulatory Visit | Attending: Orthopedic Surgery | Admitting: Orthopedic Surgery

## 2023-04-20 ENCOUNTER — Ambulatory Visit (HOSPITAL_BASED_OUTPATIENT_CLINIC_OR_DEPARTMENT_OTHER): Payer: Medicare HMO | Admitting: Certified Registered"

## 2023-04-20 ENCOUNTER — Other Ambulatory Visit: Payer: Self-pay

## 2023-04-20 ENCOUNTER — Encounter (HOSPITAL_COMMUNITY)
Admission: RE | Disposition: A | Discharge status: Home / Self Care | Payer: Self-pay | Source: Ambulatory Visit | Attending: Orthopedic Surgery

## 2023-04-20 ENCOUNTER — Encounter (HOSPITAL_COMMUNITY): Payer: Self-pay | Admitting: Orthopedic Surgery

## 2023-04-20 ENCOUNTER — Ambulatory Visit (HOSPITAL_COMMUNITY): Payer: Medicare HMO

## 2023-04-20 ENCOUNTER — Ambulatory Visit (HOSPITAL_COMMUNITY): Payer: Medicare HMO | Admitting: Medical

## 2023-04-20 DIAGNOSIS — Z96642 Presence of left artificial hip joint: Secondary | ICD-10-CM | POA: Insufficient documentation

## 2023-04-20 DIAGNOSIS — I10 Essential (primary) hypertension: Secondary | ICD-10-CM | POA: Diagnosis not present

## 2023-04-20 DIAGNOSIS — Z87891 Personal history of nicotine dependence: Secondary | ICD-10-CM | POA: Insufficient documentation

## 2023-04-20 DIAGNOSIS — M1611 Unilateral primary osteoarthritis, right hip: Principal | ICD-10-CM | POA: Insufficient documentation

## 2023-04-20 DIAGNOSIS — Z79899 Other long term (current) drug therapy: Secondary | ICD-10-CM | POA: Diagnosis not present

## 2023-04-20 DIAGNOSIS — I4891 Unspecified atrial fibrillation: Secondary | ICD-10-CM | POA: Diagnosis not present

## 2023-04-20 DIAGNOSIS — I2510 Atherosclerotic heart disease of native coronary artery without angina pectoris: Secondary | ICD-10-CM

## 2023-04-20 DIAGNOSIS — I129 Hypertensive chronic kidney disease with stage 1 through stage 4 chronic kidney disease, or unspecified chronic kidney disease: Secondary | ICD-10-CM | POA: Diagnosis not present

## 2023-04-20 DIAGNOSIS — Z96641 Presence of right artificial hip joint: Secondary | ICD-10-CM | POA: Diagnosis not present

## 2023-04-20 DIAGNOSIS — N183 Chronic kidney disease, stage 3 unspecified: Secondary | ICD-10-CM | POA: Insufficient documentation

## 2023-04-20 DIAGNOSIS — F419 Anxiety disorder, unspecified: Secondary | ICD-10-CM | POA: Diagnosis not present

## 2023-04-20 DIAGNOSIS — Z7901 Long term (current) use of anticoagulants: Secondary | ICD-10-CM | POA: Insufficient documentation

## 2023-04-20 HISTORY — PX: TOTAL HIP ARTHROPLASTY: SHX124

## 2023-04-20 LAB — TYPE AND SCREEN
ABO/RH(D): O POS
Antibody Screen: NEGATIVE

## 2023-04-20 SURGERY — ARTHROPLASTY, HIP, TOTAL,POSTERIOR APPROACH
Anesthesia: Monitor Anesthesia Care | Site: Hip | Laterality: Right

## 2023-04-20 MED ORDER — METOCLOPRAMIDE HCL 5 MG PO TABS
5.0000 mg | ORAL_TABLET | Freq: Three times a day (TID) | ORAL | Status: DC | PRN
Start: 2023-04-20 — End: 2023-04-21

## 2023-04-20 MED ORDER — POLYETHYLENE GLYCOL 3350 17 G PO PACK
17.0000 g | PACK | Freq: Every day | ORAL | Status: DC | PRN
Start: 2023-04-20 — End: 2023-04-21

## 2023-04-20 MED ORDER — PROPOFOL 10 MG/ML IV BOLUS
INTRAVENOUS | Status: DC | PRN
  Administered 2023-04-20: 20 mg via INTRAVENOUS

## 2023-04-20 MED ORDER — APIXABAN 2.5 MG PO TABS
2.5000 mg | ORAL_TABLET | Freq: Two times a day (BID) | ORAL | Status: DC
  Administered 2023-04-21: 2.5 mg via ORAL
  Filled 2023-04-20: qty 1

## 2023-04-20 MED ORDER — PHENOL 1.4 % MT LIQD: 1.0000 | OROMUCOSAL | Status: DC | PRN

## 2023-04-20 MED ORDER — FENTANYL CITRATE (PF) 100 MCG/2ML IJ SOLN
INTRAMUSCULAR | Status: AC
  Filled 2023-04-20: qty 2

## 2023-04-20 MED ORDER — FENTANYL CITRATE (PF) 100 MCG/2ML IJ SOLN
INTRAMUSCULAR | Status: DC | PRN
  Administered 2023-04-20 (×2): 25 ug via INTRAVENOUS

## 2023-04-20 MED ORDER — BUPIVACAINE LIPOSOME 1.3 % IJ SUSP: 10.0000 mL | Freq: Once | INTRAMUSCULAR | Status: DC

## 2023-04-20 MED ORDER — ALUM & MAG HYDROXIDE-SIMETH 200-200-20 MG/5ML PO SUSP
30.0000 mL | ORAL | Status: DC | PRN
Start: 2023-04-20 — End: 2023-04-21

## 2023-04-20 MED ORDER — SODIUM CHLORIDE (PF) 0.9 % IJ SOLN
INTRAMUSCULAR | Status: DC | PRN
  Administered 2023-04-20: 20 mL

## 2023-04-20 MED ORDER — MENTHOL 3 MG MT LOZG: 1.0000 | LOZENGE | OROMUCOSAL | Status: DC | PRN

## 2023-04-20 MED ORDER — FENTANYL CITRATE PF 50 MCG/ML IJ SOSY
25.0000 ug | PREFILLED_SYRINGE | INTRAMUSCULAR | Status: DC | PRN
  Administered 2023-04-20: 25 ug via INTRAVENOUS

## 2023-04-20 MED ORDER — PROPOFOL 10 MG/ML IV BOLUS
INTRAVENOUS | Status: AC
  Filled 2023-04-20: qty 20

## 2023-04-20 MED ORDER — CEFAZOLIN SODIUM-DEXTROSE 2-4 GM/100ML-% IV SOLN
INTRAVENOUS | Status: AC
  Filled 2023-04-20: qty 100

## 2023-04-20 MED ORDER — BUPIVACAINE IN DEXTROSE 0.75-8.25 % IT SOLN
INTRATHECAL | Status: DC | PRN
  Administered 2023-04-20: 1.8 mL via INTRATHECAL

## 2023-04-20 MED ORDER — BISACODYL 10 MG RE SUPP: 10.0000 mg | Freq: Every day | RECTAL | Status: DC | PRN

## 2023-04-20 MED ORDER — OXYCODONE HCL 5 MG PO TABS
10.0000 mg | ORAL_TABLET | ORAL | Status: DC | PRN
  Administered 2023-04-20 – 2023-04-21 (×3): 10 mg via ORAL
  Filled 2023-04-20 (×3): qty 2

## 2023-04-20 MED ORDER — ONDANSETRON HCL 4 MG/2ML IJ SOLN
INTRAMUSCULAR | Status: AC
  Filled 2023-04-20: qty 2

## 2023-04-20 MED ORDER — PROPOFOL 1000 MG/100ML IV EMUL
INTRAVENOUS | Status: AC
  Filled 2023-04-20: qty 200

## 2023-04-20 MED ORDER — ACETAMINOPHEN 500 MG PO TABS
1000.0000 mg | ORAL_TABLET | Freq: Once | ORAL | Status: AC
  Administered 2023-04-20: 1000 mg via ORAL
  Filled 2023-04-20: qty 2

## 2023-04-20 MED ORDER — ONDANSETRON HCL 4 MG/2ML IJ SOLN
INTRAMUSCULAR | Status: DC | PRN
  Administered 2023-04-20: 4 mg via INTRAVENOUS

## 2023-04-20 MED ORDER — ALLOPURINOL 100 MG PO TABS
100.0000 mg | ORAL_TABLET | Freq: Every day | ORAL | Status: DC
  Administered 2023-04-21: 100 mg via ORAL
  Filled 2023-04-20: qty 1

## 2023-04-20 MED ORDER — CEFAZOLIN SODIUM-DEXTROSE 2-4 GM/100ML-% IV SOLN
2.0000 g | Freq: Four times a day (QID) | INTRAVENOUS | Status: AC
  Administered 2023-04-20 (×2): 2 g via INTRAVENOUS
  Filled 2023-04-20: qty 100

## 2023-04-20 MED ORDER — DROPERIDOL 2.5 MG/ML IJ SOLN: 0.6250 mg | Freq: Once | INTRAMUSCULAR | Status: DC | PRN

## 2023-04-20 MED ORDER — LACTATED RINGERS IV SOLN: INTRAVENOUS | Status: DC

## 2023-04-20 MED ORDER — DEXAMETHASONE SODIUM PHOSPHATE 10 MG/ML IJ SOLN
INTRAMUSCULAR | Status: AC
  Filled 2023-04-20: qty 1

## 2023-04-20 MED ORDER — DEXAMETHASONE SODIUM PHOSPHATE 10 MG/ML IJ SOLN
10.0000 mg | Freq: Once | INTRAMUSCULAR | Status: AC
Start: 2023-04-21 — End: 2023-04-21
  Administered 2023-04-21: 10 mg via INTRAVENOUS
  Filled 2023-04-20: qty 1

## 2023-04-20 MED ORDER — OXYCODONE HCL 5 MG PO TABS
ORAL_TABLET | ORAL | Status: AC
  Administered 2023-04-20: 5 mg via ORAL
  Filled 2023-04-20: qty 1

## 2023-04-20 MED ORDER — ONDANSETRON HCL 4 MG/2ML IJ SOLN: 4.0000 mg | Freq: Four times a day (QID) | INTRAMUSCULAR | Status: DC | PRN

## 2023-04-20 MED ORDER — CEFAZOLIN SODIUM-DEXTROSE 2-4 GM/100ML-% IV SOLN
2.0000 g | INTRAVENOUS | Status: AC
  Administered 2023-04-20: 2 g via INTRAVENOUS
  Filled 2023-04-20: qty 100

## 2023-04-20 MED ORDER — METOPROLOL SUCCINATE ER 50 MG PO TB24
50.0000 mg | ORAL_TABLET | Freq: Every day | ORAL | Status: DC
  Filled 2023-04-20: qty 1

## 2023-04-20 MED ORDER — DIPHENHYDRAMINE HCL 12.5 MG/5ML PO ELIX: 12.5000 mg | ORAL_SOLUTION | ORAL | Status: DC | PRN

## 2023-04-20 MED ORDER — AMLODIPINE BESYLATE 5 MG PO TABS
5.0000 mg | ORAL_TABLET | Freq: Every day | ORAL | Status: DC
  Administered 2023-04-21: 5 mg via ORAL
  Filled 2023-04-20: qty 1

## 2023-04-20 MED ORDER — FUROSEMIDE 20 MG PO TABS
20.0000 mg | ORAL_TABLET | Freq: Every day | ORAL | Status: DC
Start: 2023-04-21 — End: 2023-04-21
  Administered 2023-04-21: 20 mg via ORAL
  Filled 2023-04-20: qty 1

## 2023-04-20 MED ORDER — STERILE WATER FOR IRRIGATION IR SOLN
Status: DC | PRN
  Administered 2023-04-20: 1000 mL

## 2023-04-20 MED ORDER — METOCLOPRAMIDE HCL 5 MG/ML IJ SOLN
5.0000 mg | Freq: Three times a day (TID) | INTRAMUSCULAR | Status: DC | PRN
Start: 2023-04-20 — End: 2023-04-21

## 2023-04-20 MED ORDER — PROPOFOL 500 MG/50ML IV EMUL
INTRAVENOUS | Status: DC | PRN
  Administered 2023-04-20: 40 ug/kg/min via INTRAVENOUS

## 2023-04-20 MED ORDER — ONDANSETRON HCL 4 MG PO TABS: 4.0000 mg | ORAL_TABLET | Freq: Four times a day (QID) | ORAL | Status: DC | PRN

## 2023-04-20 MED ORDER — DEXAMETHASONE SODIUM PHOSPHATE 10 MG/ML IJ SOLN
8.0000 mg | Freq: Once | INTRAMUSCULAR | Status: AC
  Administered 2023-04-20: 8 mg via INTRAVENOUS

## 2023-04-20 MED ORDER — PHENYLEPHRINE HCL-NACL 20-0.9 MG/250ML-% IV SOLN
INTRAVENOUS | Status: DC | PRN
  Administered 2023-04-20: 20 ug/min via INTRAVENOUS

## 2023-04-20 MED ORDER — DOCUSATE SODIUM 100 MG PO CAPS
100.0000 mg | ORAL_CAPSULE | Freq: Two times a day (BID) | ORAL | Status: DC
  Administered 2023-04-20 – 2023-04-21 (×2): 100 mg via ORAL
  Filled 2023-04-20 (×2): qty 1

## 2023-04-20 MED ORDER — LACTATED RINGERS IV BOLUS: 250.0000 mL | Freq: Once | INTRAVENOUS | Status: DC

## 2023-04-20 MED ORDER — ATORVASTATIN CALCIUM 40 MG PO TABS
80.0000 mg | ORAL_TABLET | Freq: Every day | ORAL | Status: DC
  Administered 2023-04-21: 80 mg via ORAL
  Filled 2023-04-20: qty 2

## 2023-04-20 MED ORDER — SODIUM CHLORIDE (PF) 0.9 % IJ SOLN
INTRAMUSCULAR | Status: AC
  Filled 2023-04-20: qty 10

## 2023-04-20 MED ORDER — 0.9 % SODIUM CHLORIDE (POUR BTL) OPTIME
TOPICAL | Status: DC | PRN
  Administered 2023-04-20: 1000 mL

## 2023-04-20 MED ORDER — TRANEXAMIC ACID-NACL 1000-0.7 MG/100ML-% IV SOLN
INTRAVENOUS | Status: AC
  Filled 2023-04-20: qty 100

## 2023-04-20 MED ORDER — CHLORHEXIDINE GLUCONATE 0.12 % MT SOLN
15.0000 mL | Freq: Once | OROMUCOSAL | Status: AC
Start: 2023-04-20 — End: 2023-04-20
  Administered 2023-04-20: 15 mL via OROMUCOSAL

## 2023-04-20 MED ORDER — LACTATED RINGERS IV BOLUS
500.0000 mL | Freq: Once | INTRAVENOUS | Status: AC
Start: 2023-04-20 — End: 2023-04-20
  Administered 2023-04-20: 500 mL via INTRAVENOUS

## 2023-04-20 MED ORDER — LIDOCAINE 2% (20 MG/ML) 5 ML SYRINGE
INTRAMUSCULAR | Status: DC | PRN
  Administered 2023-04-20: 40 mg via INTRAVENOUS

## 2023-04-20 MED ORDER — TRAMADOL HCL 50 MG PO TABS
50.0000 mg | ORAL_TABLET | Freq: Four times a day (QID) | ORAL | Status: DC
Start: 2023-04-20 — End: 2023-04-21
  Administered 2023-04-20 – 2023-04-21 (×4): 50 mg via ORAL
  Filled 2023-04-20 (×4): qty 1

## 2023-04-20 MED ORDER — HYDROMORPHONE HCL 1 MG/ML IJ SOLN: 0.5000 mg | INTRAMUSCULAR | Status: DC | PRN

## 2023-04-20 MED ORDER — OXYCODONE HCL 5 MG PO TABS: 5.0000 mg | ORAL_TABLET | ORAL | Status: DC | PRN

## 2023-04-20 MED ORDER — METHOCARBAMOL 500 MG PO TABS
500.0000 mg | ORAL_TABLET | Freq: Four times a day (QID) | ORAL | Status: DC | PRN
  Administered 2023-04-21: 500 mg via ORAL
  Filled 2023-04-20: qty 1

## 2023-04-20 MED ORDER — POVIDONE-IODINE 10 % EX SWAB: 2.0000 | Freq: Once | CUTANEOUS | Status: DC

## 2023-04-20 MED ORDER — TRANEXAMIC ACID-NACL 1000-0.7 MG/100ML-% IV SOLN
1000.0000 mg | Freq: Once | INTRAVENOUS | Status: AC
Start: 2023-04-20 — End: 2023-04-20
  Administered 2023-04-20: 1000 mg via INTRAVENOUS

## 2023-04-20 MED ORDER — TRANEXAMIC ACID-NACL 1000-0.7 MG/100ML-% IV SOLN
1000.0000 mg | INTRAVENOUS | Status: AC
Start: 2023-04-20 — End: 2023-04-20
  Administered 2023-04-20: 1000 mg via INTRAVENOUS
  Filled 2023-04-20: qty 100

## 2023-04-20 MED ORDER — METHOCARBAMOL 1000 MG/10ML IJ SOLN: 500.0000 mg | Freq: Four times a day (QID) | INTRAMUSCULAR | Status: DC | PRN

## 2023-04-20 MED ORDER — MAGNESIUM CITRATE PO SOLN: 1.0000 | Freq: Once | ORAL | Status: DC | PRN

## 2023-04-20 MED ORDER — APIXABAN 2.5 MG PO TABS: 2.5000 mg | ORAL_TABLET | Freq: Two times a day (BID) | ORAL | Status: DC

## 2023-04-20 MED ORDER — MIDAZOLAM HCL 2 MG/2ML IJ SOLN
INTRAMUSCULAR | Status: DC | PRN
  Administered 2023-04-20: 2 mg via INTRAVENOUS

## 2023-04-20 MED ORDER — MIDAZOLAM HCL 2 MG/2ML IJ SOLN
INTRAMUSCULAR | Status: AC
  Filled 2023-04-20: qty 2

## 2023-04-20 MED ORDER — ACETAMINOPHEN 325 MG PO TABS
325.0000 mg | ORAL_TABLET | Freq: Four times a day (QID) | ORAL | Status: DC | PRN
Start: 2023-04-21 — End: 2023-04-21

## 2023-04-20 MED ORDER — ORAL CARE MOUTH RINSE: 15.0000 mL | Freq: Once | OROMUCOSAL | Status: AC

## 2023-04-20 MED ORDER — ACETAMINOPHEN 500 MG PO TABS
1000.0000 mg | ORAL_TABLET | Freq: Four times a day (QID) | ORAL | Status: AC
  Administered 2023-04-20 – 2023-04-21 (×4): 1000 mg via ORAL
  Filled 2023-04-20 (×4): qty 2

## 2023-04-20 MED ORDER — FENTANYL CITRATE PF 50 MCG/ML IJ SOSY
PREFILLED_SYRINGE | INTRAMUSCULAR | Status: AC
  Administered 2023-04-20: 25 ug via INTRAVENOUS
  Filled 2023-04-20: qty 1

## 2023-04-20 MED ORDER — OXYCODONE HCL 5 MG PO TABS
ORAL_TABLET | ORAL | Status: AC
  Filled 2023-04-20: qty 2

## 2023-04-20 MED ORDER — IRBESARTAN 150 MG PO TABS
150.0000 mg | ORAL_TABLET | Freq: Every day | ORAL | Status: DC
  Administered 2023-04-21: 150 mg via ORAL
  Filled 2023-04-20: qty 1

## 2023-04-20 MED ORDER — FENTANYL CITRATE PF 50 MCG/ML IJ SOSY
PREFILLED_SYRINGE | INTRAMUSCULAR | Status: AC
  Administered 2023-04-20: 50 ug via INTRAVENOUS
  Filled 2023-04-20: qty 1

## 2023-04-20 MED ORDER — PANTOPRAZOLE SODIUM 40 MG PO TBEC
40.0000 mg | DELAYED_RELEASE_TABLET | Freq: Every day | ORAL | Status: DC
  Administered 2023-04-20 – 2023-04-21 (×2): 40 mg via ORAL
  Filled 2023-04-20 (×2): qty 1

## 2023-04-20 MED ORDER — BUPIVACAINE LIPOSOME 1.3 % IJ SUSP
INTRAMUSCULAR | Status: AC
  Filled 2023-04-20: qty 10

## 2023-04-20 SURGICAL SUPPLY — 53 items
BAG COUNTER SPONGE SURGICOUNT (BAG) IMPLANT
BIT DRILL TRIDENT 4X25 SU (BIT) IMPLANT
BLADE SAW SGTL 73X25 THK (BLADE) ×2 IMPLANT
BLADE SURG SZ10 CARB STEEL (BLADE) ×4 IMPLANT
BRUSH FEMORAL CANAL (MISCELLANEOUS) IMPLANT
CHLORAPREP W/TINT 26 (MISCELLANEOUS) ×2 IMPLANT
CLSR STERI-STRIP ANTIMIC 1/2X4 (GAUZE/BANDAGES/DRESSINGS) IMPLANT
COVER SURGICAL LIGHT HANDLE (MISCELLANEOUS) ×2 IMPLANT
DRAPE IMP U-DRAPE 54X76 (DRAPES) ×2 IMPLANT
DRAPE INCISE IOBAN 66X45 STRL (DRAPES) ×2 IMPLANT
DRAPE SURG ORHT 6 SPLT 77X108 (DRAPES) ×4 IMPLANT
DRAPE U-SHAPE 47X51 STRL (DRAPES) ×2 IMPLANT
DRSG MEPILEX POST OP 4X12 (GAUZE/BANDAGES/DRESSINGS) IMPLANT
DRSG MEPILEX POST OP 4X8 (GAUZE/BANDAGES/DRESSINGS) ×2 IMPLANT
ELECT BLADE TIP CTD 4 INCH (ELECTRODE) ×2 IMPLANT
ELECT REM PT RETURN 15FT ADLT (MISCELLANEOUS) ×2 IMPLANT
EVACUATOR 1/8 PVC DRAIN (DRAIN) IMPLANT
FACESHIELD WRAPAROUND (MASK) ×4 IMPLANT
FACESHIELD WRAPAROUND OR TEAM (MASK) ×8 IMPLANT
GLOVE BIO SURGEON STRL SZ7.5 (GLOVE) ×4 IMPLANT
GLOVE BIOGEL PI IND STRL 7.5 (GLOVE) ×2 IMPLANT
GLOVE BIOGEL PI IND STRL 8 (GLOVE) ×2 IMPLANT
GLOVE SURG SYN 7.0 (GLOVE) ×1 IMPLANT
GLOVE SURG SYN 7.0 PF PI (GLOVE) ×2 IMPLANT
GOWN STRL REUS W/ TWL LRG LVL3 (GOWN DISPOSABLE) ×2 IMPLANT
GOWN STRL REUS W/ TWL XL LVL3 (GOWN DISPOSABLE) ×2 IMPLANT
HEAD BIOLOX HIP 36/-5 (Joint) IMPLANT
HIP BIOLOX HD 36/-5 (Joint) ×1 IMPLANT
INSERT 0 DEG POLY 36 F (Miscellaneous) IMPLANT
KIT BASIN OR (CUSTOM PROCEDURE TRAY) ×2 IMPLANT
KIT TURNOVER KIT A (KITS) IMPLANT
MANIFOLD NEPTUNE II (INSTRUMENTS) ×2 IMPLANT
NS IRRIG 1000ML POUR BTL (IV SOLUTION) ×2 IMPLANT
PACK TOTAL JOINT (CUSTOM PROCEDURE TRAY) ×2 IMPLANT
PRESSURIZER FEMORAL UNIV (MISCELLANEOUS) IMPLANT
PROTECTOR NERVE ULNAR (MISCELLANEOUS) ×4 IMPLANT
RETRIEVER SUT HEWSON (MISCELLANEOUS) ×2 IMPLANT
SCREW HEX LP 6.5X20 (Screw) IMPLANT
SHELL TRIDENT II CLUST SZ 56MM (Shell) IMPLANT
STAPLER SKIN PROX WIDE 3.9 (STAPLE) ×2 IMPLANT
STEM STD OFFSET SZ5 36 (Stem) IMPLANT
STRIP CLOSURE SKIN 1/4X4 (GAUZE/BANDAGES/DRESSINGS) ×2 IMPLANT
SUCTION TUBE FRAZIER 12FR DISP (SUCTIONS) ×2 IMPLANT
SUT MNCRL AB 4-0 PS2 18 (SUTURE) ×2 IMPLANT
SUT VIC AB 0 CT1 36 (SUTURE) ×4 IMPLANT
SUT VIC AB 1 CTX36XBRD ANBCTR (SUTURE) ×4 IMPLANT
SUT VIC AB 2-0 CT1 TAPERPNT 27 (SUTURE) ×4 IMPLANT
SUT VLOC 180 0 24IN GS25 (SUTURE) ×2 IMPLANT
SYR 50ML LL SCALE MARK (SYRINGE) ×2 IMPLANT
TOWEL OR 17X26 10 PK STRL BLUE (TOWEL DISPOSABLE) ×2 IMPLANT
TRAY CATH INTERMITTENT SS 16FR (CATHETERS) IMPLANT
TRAY FOLEY MTR SLVR 16FR STAT (SET/KITS/TRAYS/PACK) ×2 IMPLANT
WATER STERILE IRR 1000ML POUR (IV SOLUTION) ×4 IMPLANT

## 2023-04-20 NOTE — Anesthesia Procedure Notes (Signed)
 Spinal  Patient location during procedure: OR Start time: 04/20/2023 8:30 AM End time: 04/20/2023 8:32 AM Staffing Performed: anesthesiologist  Anesthesiologist: Dorethea Cordella SQUIBB, DO Performed by: Dorethea Cordella SQUIBB, DO Authorized by: Dorethea Cordella SQUIBB, DO   Preanesthetic Checklist Completed: patient identified, IV checked, site marked, risks and benefits discussed, surgical consent, monitors and equipment checked, pre-op  evaluation and timeout performed Spinal Block Patient position: sitting Prep: DuraPrep Patient monitoring: heart rate, cardiac monitor, continuous pulse ox and blood pressure Approach: midline Location: L3-4 Injection technique: single-shot Needle Needle type: Pencan  Needle gauge: 24 G Needle length: 10 cm Assessment Events: CSF return Additional Notes Patient identified. Risks/Benefits/Options discussed with patient including but not limited to bleeding, infection, nerve damage, paralysis, failed block, incomplete pain control, headache, blood pressure changes, nausea, vomiting, reactions to medications, itching and postpartum back pain. Confirmed with bedside nurse the patient's most recent platelet count. Confirmed with patient that they are not currently taking any anticoagulation, have any bleeding history or any family history of bleeding disorders. Patient expressed understanding and wished to proceed. All questions were answered. Sterile technique was used throughout the entire procedure. Please see nursing notes for vital signs. Warning signs of high block given to the patient including shortness of breath, tingling/numbness in hands, complete motor block, or any concerning symptoms with instructions to call for help. Patient was given instructions on fall risk and not to get out of bed. All questions and concerns addressed with instructions to call with any issues or inadequate analgesia.

## 2023-04-20 NOTE — Transfer of Care (Signed)
 Immediate Anesthesia Transfer of Care Note  Patient: Calvin Henry  Procedure(s) Performed: TOTAL HIP ARTHROPLASTY (Right: Hip)  Patient Location: PACU  Anesthesia Type:MAC and Spinal  Level of Consciousness: drowsy  Airway & Oxygen Therapy: Patient Spontanous Breathing and Patient connected to face mask oxygen  Post-op Assessment: Report given to RN and Post -op Vital signs reviewed and stable  Post vital signs: Reviewed and stable  Last Vitals:  Vitals Value Taken Time  BP    Temp    Pulse 71 04/20/23 1046  Resp    SpO2 98 % 04/20/23 1046  Vitals shown include unfiled device data.  Last Pain:  Vitals:   04/20/23 0720  TempSrc:   PainSc: 0-No pain         Complications: No notable events documented.

## 2023-04-20 NOTE — Anesthesia Postprocedure Evaluation (Signed)
 Anesthesia Post Note  Patient: Calvin Henry  Procedure(s) Performed: TOTAL HIP ARTHROPLASTY (Right: Hip)     Patient location during evaluation: PACU Anesthesia Type: MAC and Spinal Level of consciousness: awake and alert Pain management: pain level controlled Vital Signs Assessment: post-procedure vital signs reviewed and stable Respiratory status: spontaneous breathing, nonlabored ventilation, respiratory function stable and patient connected to nasal cannula oxygen Cardiovascular status: stable and blood pressure returned to baseline Postop Assessment: no apparent nausea or vomiting Anesthetic complications: no   No notable events documented.  Last Vitals:  Vitals:   04/20/23 1415 04/20/23 1430  BP: 124/80 116/76  Pulse: (!) 55 60  Resp: 18 (!) 23  Temp:    SpO2: 100% 100%    Last Pain:  Vitals:   04/20/23 1425  TempSrc:   PainSc: 5                  [name] [name]

## 2023-04-20 NOTE — Op Note (Signed)
 04/20/2023  10:08 AM  PATIENT:  Calvin Henry   MRN: [id number]  PRE-OPERATIVE DIAGNOSIS:  OA RIGHT HIP  POST-OPERATIVE DIAGNOSIS:  OA RIGHT HIP  PROCEDURE:  Procedure(s): TOTAL HIP ARTHROPLASTY  PREOPERATIVE INDICATIONS:    BRISTOL SOY is an 63 y.o. male who has a diagnosis of <principal problem not specified> and elected for surgical management after failing conservative treatment.  The risks benefits and alternatives were discussed with the patient including but not limited to the risks of nonoperative treatment, versus surgical intervention including infection, bleeding, nerve injury, periprosthetic fracture, the need for revision surgery, dislocation, leg length discrepancy, blood clots, cardiopulmonary complications, morbidity, mortality, among others, and they were willing to proceed.     OPERATIVE REPORT     SURGEON:   Evalene JONETTA Chancy, MD    ASSISTANT:  Gerard Large, PA-C, she was present and scrubbed throughout the case, critical for completion in a timely fashion, and for retraction, instrumentation, and closure.     ANESTHESIA:  General    COMPLICATIONS:  None.     COMPONENTS:  [employer] insignia femur size 5 with a 36 mm -5 head ball and an acetabular shell size 56 with a  polyethylene liner    PROCEDURE IN DETAIL:   The patient was met in the holding area and  identified.  The appropriate hip was identified and marked at the operative site.  The patient was then transported to the OR  and  placed under anesthesia per that record.  At that point, the patient was  placed in the supine position and  secured to the operating room table and all bony prominences padded. He received pre-operative antibiotics    The operative lower extremity was prepped from the iliac crest to the distal leg.  Sterile draping was performed.  Time out was performed prior to incision.      Skin incision was made just 2 cm lateral to the ASIS  extending in line with the tensor fascia  lata. Electrocautery was used to control all bleeders. I dissected down sharply to the fascia of the tensor fascia lata was confirmed that the muscle fibers beneath were running posteriorly. I then incised the fascia over the superficial tensor fascia lata in line with the incision. The fascia was elevated off the anterior aspect of the muscle the muscle was retracted posteriorly and protected throughout the case. I then used electrocautery to incise the tensor fascia lata fascia control and all bleeders. Immediately visible was the fat over top of the anterior neck and capsule.  I removed the anterior fat from the capsule and elevated the rectus muscle off of the anterior capsule. I then removed a large time of capsule. The retractors were then placed over the anterior acetabulum as well as around the superior and inferior neck.  I then made a femoral neck cut. Then used the power corkscrew to remove the femoral head from the acetabulum and thoroughly irrigated the acetabulum. I sized the femoral head.    I then exposed the deep acetabulum, cleared out any tissue including the ligamentum teres.   After adequate visualization, I excised the labrum, and then sequentially reamed.  I then impacted the acetabular implant into place using fluoroscopy for guidance.  Appropriate version and inclination was confirmed clinically matching their bony anatomy, and with fluoroscopy.  I placed a 20 mm screw in the posterior/superio position with an excellent bite.    I then placed the polyethylene liner in place  I  then adducted the leg and released the external rotators from the posterior femur allowing it to be easily delivered up lateral and anterior to the acetabulum for preparation of the femoral canal.    I then prepared the proximal femur using the cookie-cutter and then sequentially reamed and broached.  A trial broach, neck, and head was utilized, and I reduced the hip and used floroscopy to assess the  neck length and femoral implant.  I then impacted the femoral prosthesis into place into the appropriate version. The hip was then reduced and fluoroscopy confirmed appropriate position. Leg lengths were restored.  I then irrigated the hip copiously again with, and repaired the fascia with Vicryl, followed by monocryl for the subcutaneous tissue, Monocryl for the skin, Steri-Strips and sterile gauze. The patient was then awakened and returned to PACU in stable and satisfactory condition. There were no complications.  POST OPERATIVE PLAN: WBAT, DVT px: SCD's/TED, ambulation and chemical dvt px  Evalene Chancy, MD Orthopedic Surgeon [phone]

## 2023-04-20 NOTE — Discharge Instructions (Signed)

## 2023-04-20 NOTE — Evaluation (Signed)
 Physical Therapy Evaluation Patient Details Name: Calvin Henry MRN: [id number] DOB: [birth year] Today's Date: 04/20/2023  History of Present Illness  63 yo male presents to therapy s/p R THA, anterior approach on 04/20/2023 due to failure of conservative measures. Pt PMH includes but is not limited to: A-fib, CAD, CKD III, chronic back pain, HTN, hernia repair, and L THA, (2017).  Clinical Impression   EHSAN CORVIN is a 63 y.o. male POD 0 s/p R THA, AA. Patient reports mod I with mobility at baseline. Patient is now limited by functional impairments (see PT problem list below) and requires CGA for bed mobility and CGA and cues for transfers. Patient was able to ambulate 40 feet with RW and CGA level of assist. Patient instructed in exercise to facilitate ROM and circulation to manage edema.  Patient will benefit from continued skilled PT interventions to address impairments and progress towards PLOF. Acute PT will follow to progress mobility and stair training in preparation for safe discharge home with family support and OPPT services.        If plan is discharge home, recommend the following: A little help with walking and/or transfers;A little help with bathing/dressing/bathroom;Assistance with cooking/housework;Assist for transportation;Help with stairs or ramp for entrance   Can travel by private vehicle        Equipment Recommendations Rolling walker (2 wheels)  Recommendations for Other Services       Functional Status Assessment Patient has had a recent decline in their functional status and demonstrates the ability to make significant improvements in function in a reasonable and predictable amount of time.     Precautions / Restrictions Precautions Precautions: Fall Restrictions Weight Bearing Restrictions Per Provider Order: No      Mobility  Bed Mobility Overal bed mobility: Needs Assistance Bed Mobility: Supine to Sit     Supine to sit: Contact guard, HOB  elevated, Used rails     General bed mobility comments: increased time and cues    Transfers Overall transfer level: Needs assistance Equipment used: Rolling walker (2 wheels) Transfers: Sit to/from Stand Sit to Stand: Contact guard assist           General transfer comment: min cues    Ambulation/Gait Ambulation/Gait assistance: Contact guard assist Gait Distance (Feet): 40 Feet Assistive device: Rolling walker (2 wheels) Gait Pattern/deviations: Step-to pattern, Antalgic, Trunk flexed Gait velocity: decreased     General Gait Details: B UE support at RW to offload R LE in stance phase, min cues for posture and proper distance from [employer]            Wheelchair Mobility     Tilt Bed    Modified Rankin (Stroke Patients Only)       Balance Overall balance assessment: Needs assistance Sitting-balance support: Feet supported Sitting balance-Leahy Scale: Good     Standing balance support: Bilateral upper extremity supported, During functional activity, Reliant on assistive device for balance Standing balance-Leahy Scale: Fair Standing balance comment: static standing no UE support                             Pertinent Vitals/Pain Pain Assessment Pain Assessment: 0-10 Pain Score: 5  Pain Location: R grion and LE Pain Descriptors / Indicators: Aching, Constant, Cramping, Discomfort, Grimacing, Operative site guarding Pain Intervention(s): Limited activity within patient's tolerance, Monitored during session, Patient requesting pain meds-RN notified, Repositioned, Ice applied    Home Living Family/patient expects  to be discharged to:: Private residence Living Arrangements: Alone Available Help at Discharge: Family (step daughter and other family member at night) Type of Home: House Home Access: Ramped entrance       Home Layout: One level Home Equipment: [place] - single point      Prior Function Prior Level of Function :  Independent/Modified Independent             Mobility Comments: mod I with occational use of SPC pending pain and stabiltiy, mod I for all ADLs, self care tasks       Extremity/Trunk Assessment        Lower Extremity Assessment Lower Extremity Assessment: RLE deficits/detail RLE Deficits / Details: ankle DF/PF 5/5 RLE Sensation: WNL    Cervical / Trunk Assessment Cervical / Trunk Assessment: Normal  Communication   Communication Communication: No apparent difficulties  Cognition Arousal: Alert Behavior During Therapy: WFL for tasks assessed/performed Overall Cognitive Status: Within Functional Limits for tasks assessed                                          General Comments      Exercises Total Joint Exercises Ankle Circles/Pumps: AROM, Both, 10 reps   Assessment/Plan    PT Assessment Patient needs continued PT services  PT Problem List Decreased strength;Decreased range of motion;Decreased activity tolerance;Decreased balance;Decreased mobility;Decreased coordination;Pain       PT Treatment Interventions DME instruction;Gait training;Functional mobility training;Therapeutic activities;Therapeutic exercise;Balance training;Neuromuscular re-education;Patient/family education;Modalities    PT Goals (Current goals can be found in the Care Plan section)  Acute Rehab PT Goals Patient Stated Goal: to get back to what I was doing before surgery PT Goal Formulation: With patient Time For Goal Achievement: 05/04/23 Potential to Achieve Goals: Good    Frequency 7X/week     Co-evaluation               AM-PAC PT 6 Clicks Mobility  Outcome Measure Help needed turning from your back to your side while in a flat bed without using bedrails?: None Help needed moving from lying on your back to sitting on the side of a flat bed without using bedrails?: A Little Help needed moving to and from a bed to a chair (including a wheelchair)?: A  Little Help needed standing up from a chair using your arms (e.g., wheelchair or bedside chair)?: A Little Help needed to walk in hospital room?: A Little Help needed climbing 3-5 steps with a railing? : A Lot 6 Click Score: 18    End of Session Equipment Utilized During Treatment: Gait belt Activity Tolerance: No increased pain;Patient tolerated treatment well Patient left: in chair;with call bell/phone within reach;with nursing/sitter in room Nurse Communication: Mobility status;Patient requests pain meds (pain meds provided at end of eval) PT Visit Diagnosis: Unsteadiness on feet (R26.81);Other abnormalities of gait and mobility (R26.89);Muscle weakness (generalized) (M62.81);Pain Pain - Right/Left: Right Pain - part of body: Hip;Leg    Time: 1855-1919 PT Time Calculation (min) (ACUTE ONLY): 24 min   Charges:   PT Evaluation $PT Eval Low Complexity: 1 Low PT Treatments $Gait Training: 8-22 mins PT General Charges $$ ACUTE PT VISIT: 1 Visit         [place], PT Acute Rehab   [place] VEAR Drone 04/20/2023, 7:30 PM

## 2023-04-20 NOTE — Interval H&P Note (Signed)
 History and Physical Interval Note:  04/20/2023 7:16 AM  Calvin Henry  has presented today for surgery, with the diagnosis of OA RIGHT HIP.  The various methods of treatment have been discussed with the patient and family. After consideration of risks, benefits and other options for treatment, the patient has consented to  Procedure(s): TOTAL HIP ARTHROPLASTY (Right) as a surgical intervention.  The patient's history has been reviewed, patient examined, no change in status, stable for surgery.  I have reviewed the patient's chart and labs.  Questions were answered to the patient's satisfaction.     Calvin Henry

## 2023-04-20 NOTE — Anesthesia Procedure Notes (Signed)
 Procedure Name: MAC Date/Time: 04/20/2023 8:28 AM  Performed by: Metta Andrea NOVAK, CRNAPre-anesthesia Checklist: Patient identified, Emergency Drugs available, Suction available, Patient being monitored and Timeout performed Oxygen Delivery Method: Simple face mask Placement Confirmation: positive ETCO2

## 2023-04-21 ENCOUNTER — Other Ambulatory Visit: Payer: Self-pay

## 2023-04-21 ENCOUNTER — Encounter (HOSPITAL_COMMUNITY): Payer: Self-pay | Admitting: Orthopedic Surgery

## 2023-04-21 DIAGNOSIS — M1611 Unilateral primary osteoarthritis, right hip: Secondary | ICD-10-CM | POA: Diagnosis not present

## 2023-04-21 DIAGNOSIS — Z79899 Other long term (current) drug therapy: Secondary | ICD-10-CM | POA: Diagnosis not present

## 2023-04-21 DIAGNOSIS — I2510 Atherosclerotic heart disease of native coronary artery without angina pectoris: Secondary | ICD-10-CM | POA: Diagnosis not present

## 2023-04-21 DIAGNOSIS — Z7901 Long term (current) use of anticoagulants: Secondary | ICD-10-CM | POA: Diagnosis not present

## 2023-04-21 DIAGNOSIS — I129 Hypertensive chronic kidney disease with stage 1 through stage 4 chronic kidney disease, or unspecified chronic kidney disease: Secondary | ICD-10-CM | POA: Diagnosis not present

## 2023-04-21 DIAGNOSIS — N183 Chronic kidney disease, stage 3 unspecified: Secondary | ICD-10-CM | POA: Diagnosis not present

## 2023-04-21 DIAGNOSIS — I4891 Unspecified atrial fibrillation: Secondary | ICD-10-CM | POA: Diagnosis not present

## 2023-04-21 DIAGNOSIS — Z96642 Presence of left artificial hip joint: Secondary | ICD-10-CM | POA: Diagnosis not present

## 2023-04-21 DIAGNOSIS — Z87891 Personal history of nicotine dependence: Secondary | ICD-10-CM | POA: Diagnosis not present

## 2023-04-21 MED ORDER — OXYCODONE HCL 5 MG PO TABS: 5.0000 mg | ORAL_TABLET | Freq: Four times a day (QID) | ORAL | 0 refills | Status: DC | PRN

## 2023-04-21 MED ORDER — METHOCARBAMOL 750 MG PO TABS: 750.0000 mg | ORAL_TABLET | Freq: Three times a day (TID) | ORAL | 0 refills | Status: DC | PRN

## 2023-04-21 MED ORDER — ONDANSETRON 4 MG PO TBDP: 4.0000 mg | ORAL_TABLET | Freq: Three times a day (TID) | ORAL | 0 refills | Status: DC | PRN

## 2023-04-21 NOTE — Care Management Obs Status (Signed)
 MEDICARE OBSERVATION STATUS NOTIFICATION   Patient Details  Name: Calvin Henry MRN: [id number] Date of Birth: 09/03/1960   Medicare Observation Status Notification Given:  Yes    HOYLEValentina Gu, LCSW 04/21/2023, 11:59 AM

## 2023-04-21 NOTE — Plan of Care (Signed)
  Problem: Education: Goal: Knowledge of General Education information will improve Description: Including pain rating scale, medication(s)/side effects and non-pharmacologic comfort measures Outcome: Progressing   Problem: Activity: Goal: Risk for activity intolerance will decrease Outcome: Progressing   Problem: Pain Management: Goal: General experience of comfort will improve Outcome: Progressing

## 2023-04-21 NOTE — Progress Notes (Signed)
 Physical Therapy Treatment Patient Details Name: Calvin Henry MRN: [id number] DOB: 1960/07/11 Today's Date: 04/21/2023   History of Present Illness 63 yo male presents to therapy s/p R THA, anterior approach on 04/20/2023 due to failure of conservative measures. Pt PMH includes but is not limited to: A-fib, CAD, CKD III, chronic back pain, HTN, hernia repair, and L THA, (2017).    PT Comments  Pt making excellent progress. Meeting PT goals. Ready for d/c with family assist as needed. HEP initiated, plan is for OPPT.     If plan is discharge home, recommend the following: A little help with walking and/or transfers;A little help with bathing/dressing/bathroom;Assistance with cooking/housework;Assist for transportation;Help with stairs or ramp for entrance   Can travel by private vehicle        Equipment Recommendations  Rolling walker (2 wheels)    Recommendations for Other Services       Precautions / Restrictions Precautions Precautions: Fall Restrictions Weight Bearing Restrictions Per Provider Order: No     Mobility  Bed Mobility Overal bed mobility: Needs Assistance Bed Mobility: Supine to Sit     Supine to sit: Supervision, HOB elevated     General bed mobility comments: increased time and cues to self assist    Transfers Overall transfer level: Needs assistance Equipment used: Rolling walker (2 wheels) Transfers: Sit to/from Stand Sit to Stand: Contact guard assist, Supervision           General transfer comment: cues for hand placement    Ambulation/Gait Ambulation/Gait assistance: Supervision Gait Distance (Feet): 120 Feet Assistive device: Rolling walker (2 wheels) Gait Pattern/deviations: Step-to pattern, Step-through pattern Gait velocity: decreased     General Gait Details: cues for sequence   Stairs             Wheelchair Mobility     Tilt Bed    Modified Rankin (Stroke Patients Only)       Balance Overall balance  assessment: Needs assistance Sitting-balance support: Feet supported Sitting balance-Leahy Scale: Good       Standing balance-Leahy Scale: Fair                              Cognition Arousal: Alert Behavior During Therapy: WFL for tasks assessed/performed Overall Cognitive Status: Within Functional Limits for tasks assessed                                          Exercises Total Joint Exercises Ankle Circles/Pumps: AROM, Both, 10 reps Quad Sets: AROM, Both, 10 reps Heel Slides: AAROM, 5 reps, Right    General Comments        Pertinent Vitals/Pain Pain Assessment Pain Assessment: 0-10 Pain Score: 4  Pain Location: R hip Pain Descriptors / Indicators: Tightness Pain Intervention(s): Limited activity within patient's tolerance, Monitored during session, Premedicated before session, Repositioned, Ice applied    Home Living                          Prior Function            PT Goals (current goals can now be found in the care plan section) Acute Rehab PT Goals Patient Stated Goal: to get back to what I was doing before surgery PT Goal Formulation: With patient Time For Goal Achievement: 05/04/23 Potential to  Achieve Goals: Good Progress towards PT goals: Progressing toward goals    Frequency    7X/week      PT Plan      Co-evaluation              AM-PAC PT 6 Clicks Mobility   Outcome Measure  Help needed turning from your back to your side while in a flat bed without using bedrails?: None Help needed moving from lying on your back to sitting on the side of a flat bed without using bedrails?: None Help needed moving to and from a bed to a chair (including a wheelchair)?: None Help needed standing up from a chair using your arms (e.g., wheelchair or bedside chair)?: None Help needed to walk in hospital room?: A Little Help needed climbing 3-5 steps with a railing? : A Little 6 Click Score: 22    End of  Session Equipment Utilized During Treatment: Gait belt Activity Tolerance: No increased pain;Patient tolerated treatment well Patient left: in chair;with call bell/phone within reach;with chair alarm set Nurse Communication: Mobility status PT Visit Diagnosis: Unsteadiness on feet (R26.81);Other abnormalities of gait and mobility (R26.89);Muscle weakness (generalized) (M62.81);Pain Pain - Right/Left: Right Pain - part of body: Hip;Leg     Time: 1002-1015 PT Time Calculation (min) (ACUTE ONLY): 13 min  Charges:    $Gait Training: 8-22 mins PT General Charges $$ ACUTE PT VISIT: 1 Visit                     Ethelbert Thain, PT  Acute Rehab Dept [facility]) [phone]  04/21/2023    [facility] 04/21/2023, 11:00 AM

## 2023-04-21 NOTE — TOC Transition Note (Signed)
 Transition of Care [facility]) - Discharge Note   Patient Details  Name: Calvin Henry MRN: [id number] Date of Birth: 1960-11-13  Transition of Care [facility]) CM/SW Contact:  NORMAN ASPEN, LCSW Phone Number: 04/21/2023, 10:26 AM   Clinical Narrative:    Met with pt today and confirming need for RW.  [employer] unable to provide due to insurance network and pt without other agency preference - referral placed with [employer] [facility] and item to be delivered to room prior to dc. OPPT already set up with [facility] PT.  No further TOC needs.   Final next level of care: OP Rehab Barriers to Discharge: No Barriers Identified   Patient Goals and CMS Choice Patient states their goals for this hospitalization and ongoing recovery are:: return home          Discharge Placement                       Discharge Plan and Services Additional resources added to the After Visit Summary for                  DME Arranged: Walker rolling DME Agency: [employer] Date DME Agency Contacted: 04/21/23 Time DME Agency Contacted: 1025 Representative spoke with at DME Agency: Darlyn            Social Drivers of Health (SDOH) Interventions SDOH Screenings   Food Insecurity: No Food Insecurity (04/20/2023)  Housing: Low Risk  (04/20/2023)  Transportation Needs: No Transportation Needs (04/20/2023)  Utilities: Not At Risk (04/20/2023)  Financial Resource Strain: Patient Declined (05/11/2022)   Received from [facility], [facility]  Physical Activity: Inactive (10/19/2021)   Received from [facility], [facility]  Social Connections: Unknown (10/25/2022)   Received from [facility]  Stress: No Stress Concern Present (10/19/2021)   Received from [facility], [facility]  Tobacco Use: Medium Risk (04/20/2023)  Health Literacy: Medium Risk (04/21/2022)   Received from [facility]     Readmission Risk Interventions     No data to display

## 2023-04-21 NOTE — Discharge Summary (Signed)
 Physician Discharge Summary  Patient ID: DIMAS SCHECK MRN: [id number] DOB/AGE: March 19, 1961 63 y.o.  Admit date: 04/20/2023 Discharge date: 04/21/2023  Admission Diagnoses: right hip OA  Discharge Diagnoses:  Principal Problem:   S/P total right hip arthroplasty   Discharged Condition: fair  Hospital Course: Patient underwent a right THA by Dr. Beverley without complications. He spent the night in observation for pain control and mobilization. He is now ready for d/c home with OPPT.  Consults: None  Significant Diagnostic Studies: n/a  Treatments: IV hydration, antibiotics: Ancef , analgesia: acetaminophen , Dilaudid , and Oxycodone , anticoagulation: Eliquis , therapies: PT and SW, and surgery: right THA  Discharge Exam: Blood pressure 120/76, pulse 82, temperature 97.8 F (36.6 C), resp. rate 17, height 5' 6 (1.676 m), weight 110.2 kg, SpO2 97%. General appearance: alert, cooperative, and no distress Head: Normocephalic, without obvious abnormality, atraumatic Resp: no accessory muscle use Cardio: regular rate and rhythm Extremities: extremities normal, atraumatic, no cyanosis or edema Pulses:  L brachial 2+ R brachial 2+  L radial 2+ R radial 2+  L inguinal 2+ R inguinal 2+  L popliteal 2+ R popliteal 2+  L posterior tibial 2+ R posterior tibial 2+  L dorsalis pedis 2+ R dorsalis pedis 2+   Neurologic: Grossly normal Incision/Wound: c/d/i  Disposition: Discharge disposition: 01-Home or Self Care       Discharge Instructions     Call MD / Call 911   Complete by: As directed    If you experience chest pain or shortness of breath, CALL 911 and be transported to the hospital emergency room.  If you develope a fever above 101 F, pus (white drainage) or increased drainage or redness at the wound, or calf pain, call your surgeon's office.   Diet - low sodium heart healthy   Complete by: As directed    Discharge instructions   Complete by: As directed    You may bear  weight as tolerated. Keep your dressing on and dry until follow up. Take medicine to prevent blood clots as directed. Take pain medicine as needed with the goal of transitioning to over the counter medicines. It is okay to take 2 tablets of your narcotic medicine instead of just 1 tablet in the first 1-2 days after surgery when pain is at the worst. Do not continue to do this for multiple days at a time beyond that though.    INSTRUCTIONS AFTER JOINT REPLACEMENT   Remove items at home which could result in a fall. This includes throw rugs or furniture in walking pathways ICE to the affected joint every three hours while awake for 30 minutes at a time, for at least the first 3-5 days, and then as needed for pain and swelling.  Continue to use ice for pain and swelling. You may notice swelling that will progress down to the foot and ankle.  This is normal after surgery.  Elevate your leg when you are not up walking on it.   Continue to use the breathing machine you got in the hospital (incentive spirometer) which will help keep your temperature down.  It is common for your temperature to cycle up and down following surgery, especially at night when you are not up moving around and exerting yourself.  The breathing machine keeps your lungs expanded and your temperature down.   DIET:  As you were doing prior to hospitalization, we recommend a well-balanced diet.  DRESSING / WOUND CARE / SHOWERING  You may shower 3 days after  surgery, but keep the wounds dry during showering.  You may use an occlusive plastic wrap (Press'n Seal for example) with blue painter's tape at edges, NO SOAKING/SUBMERGING IN THE BATHTUB.  If the bandage gets wet, call the office.   ACTIVITY  Increase activity slowly as tolerated, but follow the weight bearing instructions below.   No driving for 6 weeks or until further direction given by your physician.  You cannot drive while taking narcotics.  No lifting or carrying  greater than 10 lbs. until further directed by your surgeon. Avoid periods of inactivity such as sitting longer than an hour when not asleep. This helps prevent blood clots.  You may return to work once you are authorized by your doctor.    WEIGHT BEARING   Weight bearing as tolerated with assist device (walker, cane, etc) as directed, use it as long as suggested by your surgeon or therapist, typically at least 4-6 weeks.   EXERCISES  Results after joint replacement surgery are often greatly improved when you follow the exercise, range of motion and muscle strengthening exercises prescribed by your doctor. Safety measures are also important to protect the joint from further injury. Any time any of these exercises cause you to have increased pain or swelling, decrease what you are doing until you are comfortable again and then slowly increase them. If you have problems or questions, call your caregiver or physical therapist for advice.   Rehabilitation is important following a joint replacement. After just a few days of immobilization, the muscles of the leg can become weakened and shrink (atrophy).  These exercises are designed to build up the tone and strength of the thigh and leg muscles and to improve motion. Often times heat used for twenty to thirty minutes before working out will loosen up your tissues and help with improving the range of motion but do not use heat for the first two weeks following surgery (sometimes heat can increase post-operative swelling).   These exercises can be done on a training (exercise) mat, on the floor, on a table or on a bed. Use whatever works the best and is most comfortable for you.    Use music or television while you are exercising so that the exercises are a pleasant break in your day. This will make your life better with the exercises acting as a break in your routine that you can look forward to.   Perform all exercises about fifteen times, three times per  day or as directed.  You should exercise both the operative leg and the other leg as well.  Exercises include:   Quad Sets - Tighten up the muscle on the front of the thigh (Quad) and hold for 5-10 seconds.   Straight Leg Raises - With your knee straight (if you were given a brace, keep it on), lift the leg to 60 degrees, hold for 3 seconds, and slowly lower the leg.  Perform this exercise against resistance later as your leg gets stronger.  Leg Slides: Lying on your back, slowly slide your foot toward your buttocks, bending your knee up off the floor (only go as far as is comfortable). Then slowly slide your foot back down until your leg is flat on the floor again.  Angel Wings: Lying on your back spread your legs to the side as far apart as you can without causing discomfort.  Hamstring Strength:  Lying on your back, push your heel against the floor with your leg straight by tightening  up the muscles of your buttocks.  Repeat, but this time bend your knee to a comfortable angle, and push your heel against the floor.  You may put a pillow under the heel to make it more comfortable if necessary.   A rehabilitation program following joint replacement surgery can speed recovery and prevent re-injury in the future due to weakened muscles. Contact your doctor or a physical therapist for more information on knee rehabilitation.    CONSTIPATION  Constipation is defined medically as fewer than three stools per week and severe constipation as less than one stool per week.  Even if you have a regular bowel pattern at home, your normal regimen is likely to be disrupted due to multiple reasons following surgery.  Combination of anesthesia, postoperative narcotics, change in appetite and fluid intake all can affect your bowels.   YOU MUST use at least one of the following options; they are listed in order of increasing strength to get the job done.  They are all available over the counter, and you may need to  use some, POSSIBLY even all of these options:    Drink plenty of fluids (prune juice may be helpful) and high fiber foods Colace 100 mg by mouth twice a day  Senokot for constipation as directed and as needed Dulcolax (bisacodyl ), take with full glass of water   Miralax  (polyethylene glycol) once or twice a day as needed.  If you have tried all these things and are unable to have a bowel movement in the first 3-4 days after surgery call either your surgeon or your primary doctor.    If you experience loose stools or diarrhea, hold the medications until you stool forms back up.  If your symptoms do not get better within 1 week or if they get worse, check with your doctor.  If you experience the worst abdominal pain ever or develop nausea or vomiting, please contact the office immediately for further recommendations for treatment.   ITCHING:  If you experience itching with your medications, try taking only a single pain pill, or even half a pain pill at a time.  You can also use Benadryl  over the counter for itching or also to help with sleep.   TED HOSE STOCKINGS:  Use stockings on both legs until for at least 2 weeks or as directed by physician office. They may be removed at night for sleeping.  MEDICATIONS:  See your medication summary on the After Visit Summary that nursing will review with you.  You may have some home medications which will be placed on hold until you complete the course of blood thinner medication.  It is important for you to complete the blood thinner medication as prescribed.  Take medicines as prescribed.   You have several different medicines that work in different ways. - Tylenol  is for mild to moderate pain. Try to take this medicine before turning to your narcotic medicines. - Robaxin  is for muscle spasms. This medicine can make you drowsy. - Continue to take your normal daily dose of Percocet for your chronic pain. We will provide additional Oxycodone  for  breakthrough pain not resolved by the Percocet. Take this for severe pain. This medicine can be dehydrating / constipating. - Zofran  is for nausea and vomiting. - Your Eliquis  will be used to prevent blood clots after surgery. YOU MUST TAKE THIS MEDICINE!!  PRECAUTIONS:  If you experience chest pain or shortness of breath - call 911 immediately for transfer to the hospital emergency  department.   If you develop a fever greater that 101 F, purulent drainage from wound, increased redness or drainage from wound, foul odor from the wound/dressing, or calf pain - CONTACT YOUR SURGEON.                                                   FOLLOW-UP APPOINTMENTS:  If you do not already have a post-op appointment, please call the office [phone] for an appointment to be seen by Dr. Beverley in 2 weeks.   OTHER INSTRUCTIONS:   MAKE SURE YOU:  Understand these instructions.  Get help right away if you are not doing well or get worse.    Thank you for letting us  be a part of your medical care team.  It is a privilege we respect greatly.  We hope these instructions will help you stay on track for a fast and full recovery!   Driving restrictions   Complete by: As directed    No driving for 2-6 weeks   Post-operative opioid taper instructions:   Complete by: As directed    POST-OPERATIVE OPIOID TAPER INSTRUCTIONS: It is important to wean off of your opioid medication as soon as possible. If you do not need pain medication after your surgery it is ok to stop day one. Opioids include: Codeine, Hydrocodone (Norco, Vicodin), Oxycodone (Percocet, oxycontin ) and hydromorphone  amongst others.  Long term and even short term use of opiods can cause: Increased pain response Dependence Constipation Depression Respiratory depression And more.  Withdrawal symptoms can include Flu like symptoms Nausea, vomiting And more Techniques to manage these symptoms Hydrate well Eat regular healthy meals Stay  active Use relaxation techniques(deep breathing, meditating, yoga) Do Not substitute Alcohol to help with tapering If you have been on opioids for less than two weeks and do not have pain than it is ok to stop all together.  Plan to wean off of opioids This plan should start within one week post op of your joint replacement. Maintain the same interval or time between taking each dose and first decrease the dose.  Cut the total daily intake of opioids by one tablet each day Next start to increase the time between doses. The last dose that should be eliminated is the evening dose.      TED hose   Complete by: As directed    Use stockings (TED hose) for 2 weeks on right leg(s).  You may remove them at night for sleeping.   Weight bearing as tolerated   Complete by: As directed       Allergies as of 04/21/2023   No Known Allergies      Medication List     TAKE these medications    acetaminophen  500 MG tablet Commonly known as: TYLENOL  Take 1,000 mg by mouth every 6 (six) hours as needed for moderate pain.   allopurinol  100 MG tablet Commonly known as: ZYLOPRIM  Take 100 mg by mouth daily.   amLODipine  5 MG tablet Commonly known as: NORVASC  Take 5 mg by mouth daily.   apixaban  5 MG Tabs tablet Commonly known as: ELIQUIS  Take 5 mg by mouth 2 (two) times daily.   atorvastatin  80 MG tablet Commonly known as: LIPITOR Take 80 mg by mouth daily.   chlorthalidone 25 MG tablet Commonly known as: HYGROTON Take 12.5 mg by mouth every Monday, Wednesday,  and Friday.   furosemide  20 MG tablet Commonly known as: LASIX  Take 20 mg by mouth daily.   methocarbamol  750 MG tablet Commonly known as: Robaxin -750 Take 1 tablet (750 mg total) by mouth every 8 (eight) hours as needed for muscle spasms.   metoprolol  succinate 50 MG 24 hr tablet Commonly known as: TOPROL -XL TAKE 1 TABLET BY MOUTH DAILY   NEEDLE (DISP) 18 G 18G X 1-1/2 Misc Use to draw up testosterone    olmesartan  20 MG tablet Commonly known as: BENICAR Take 20 mg by mouth daily.   ondansetron  4 MG disintegrating tablet Commonly known as: ZOFRAN -ODT Take 1 tablet (4 mg total) by mouth every 8 (eight) hours as needed for nausea or vomiting.   oxyCODONE  5 MG immediate release tablet Commonly known as: Roxicodone  Take 1 tablet (5 mg total) by mouth every 6 (six) hours as needed for breakthrough pain (after surgery that is not resolved by first taking your normal daily dose of Percocet).   oxyCODONE -acetaminophen  10-325 MG tablet Commonly known as: PERCOCET Take 1 tablet by mouth 4 (four) times daily as needed for pain.   pantoprazole  40 MG tablet Commonly known as: PROTONIX  Take 1 tablet (40 mg total) by mouth daily.   SYRINGE-NEEDLE (DISP) 3 ML 22G X 1-1/2 3 ML Misc Use to inject testosterone    testosterone  cypionate 200 MG/ML injection Commonly known as: DEPOTESTOSTERONE CYPIONATE Inject 1 mL (200 mg total) into the muscle every 14 (fourteen) days.   Vitamin D3 Super Strength 50 MCG (2000 UT) Caps Generic drug: Cholecalciferol Take 1 capsule by mouth daily.               Durable Medical Equipment  (From admission, onward)           Start     Ordered   04/21/23 1021  For home use only DME Walker rolling  Once       Question Answer Comment  Walker: With 5 Inch Wheels   Patient needs a walker to treat with the following condition S/P total right hip arthroplasty      04/21/23 1020              Discharge Care Instructions  (From admission, onward)           Start     Ordered   04/21/23 0000  Weight bearing as tolerated        04/21/23 1433            Follow-up Information     Beverley Evalene BIRCH, MD. Go on 05/05/2023.   Specialty: Orthopedic Surgery Why: Your appointment is scheduled for 4:00 Contact information: [address] [place] [address] [phone]         [facility] Go on 04/22/2023.    Specialty: Physical Therapy Why: your outpatient physical theapy has been scheduled. they will call you with a time Contact information: [facility] [address] [place] [address] [phone]                 Signed: Gerard CHRISTELLA Ted DEVONNA 04/21/2023, 5:06 PM

## 2023-04-21 NOTE — Progress Notes (Signed)
    Subjective: Patient reports pain as moderate.  Tolerating diet.  Urinating.   No CP, SOB.  Has mobilized OOB with PT some.   Objective:   VITALS:   Vitals:   04/20/23 2117 04/21/23 0122 04/21/23 0622 04/21/23 0931  BP: 131/67 (!) 109/50 106/83 120/76  Pulse: 65 76 87 82  Resp: 18 17 17 17   Temp: 98 F (36.7 C) 98.2 F (36.8 C) 98.2 F (36.8 C) 97.8 F (36.6 C)  TempSrc: Oral Oral Oral   SpO2: 99% 97% 96% 97%  Weight:      Height:          Latest Ref Rng & Units 03/30/2023    9:00 AM 10/02/2022    9:08 AM 02/15/2017   11:18 AM  CBC  WBC 3.4 - 10.8 x10E3/uL 5.1  6.0  6.1   Hemoglobin 13.0 - 17.7 g/dL 83.8  86.0  84.3   Hematocrit 37.5 - 51.0 % 49.6  42.0  46.8   Platelets 150 - 450 x10E3/uL 217  240  280       Latest Ref Rng & Units 03/30/2023    9:00 AM 11/24/2022    2:03 PM 03/01/2017    2:02 PM  BMP  Glucose 70 - 99 mg/dL 865  899  90   BUN 8 - 27 mg/dL 14  21  12    Creatinine 0.76 - 1.27 mg/dL 8.39  8.52  8.56   BUN/Creat Ratio 10 - 24 9     Sodium 134 - 144 mmol/L 141  137  136   Potassium 3.5 - 5.2 mmol/L 4.5  4.2  3.9   Chloride 96 - 106 mmol/L 102  106  102   CO2 20 - 29 mmol/L 23  23  26    Calcium  8.6 - 10.2 mg/dL 9.4  8.9  9.0    Intake/Output      01/07 0701 01/08 0700 01/08 0701 01/09 0700   P.O. 240    I.V. (mL/kg) 800 (7.3)    IV Piggyback 313.3    Total Intake(mL/kg) 1353.3 (12.3)    Urine (mL/kg/hr) 1900 (0.7)    Blood 300    Total Output 2200    Net -846.7            Physical Exam: General: NAD.  Sitting up in bedside chair, calm, comfortable Resp: No increased wob Cardio: regular rate and rhythm ABD soft Neurologically intact MSK Neurovascularly intact Sensation intact distally Intact pulses distally Dorsiflexion/Plantar flexion intact Incision: dressing C/D/I   Assessment: 1 Day Post-Op  S/P Procedure(s) (LRB): TOTAL HIP ARTHROPLASTY (Right) by Dr. Evalene BIRCH. Beverley on 04/20/23  Principal Problem:   S/P total right  hip arthroplasty   Plan:  Advance diet Up with therapy Incentive Spirometry Elevate and Apply ice  Weightbearing: WBAT RLE Insicional and dressing care: Dressings left intact until follow-up and Reinforce dressings as needed Orthopedic device(s): None Showering: Keep dressing dry VTE prophylaxis:  Eliquis  2.5mg  bid POD 1 and then back to normal dose of 5mg  bid  , SCDs, ambulation Pain control: PRN Follow - up plan: 2 weeks Contact information:  Evalene Beverley MD, Gerard Large PA-C  Dispo: Home later today.     Gerard CHRISTELLA Large, PA-C Office [phone] 04/21/2023, 9:34 AM

## 2023-04-21 NOTE — Plan of Care (Signed)
  Problem: Education: Goal: Knowledge of General Education information will improve Description: Including pain rating scale, medication(s)/side effects and non-pharmacologic comfort measures Outcome: Progressing   Problem: Health Behavior/Discharge Planning: Goal: Ability to manage health-related needs will improve Outcome: Progressing   Problem: Clinical Measurements: Goal: Ability to maintain clinical measurements within normal limits will improve Outcome: Progressing Goal: Will remain free from infection Outcome: Progressing Goal: Diagnostic test results will improve Outcome: Progressing Goal: Respiratory complications will improve Outcome: Progressing Goal: Cardiovascular complication will be avoided Outcome: Progressing   Problem: Activity: Goal: Risk for activity intolerance will decrease Outcome: Progressing   Problem: Nutrition: Goal: Adequate nutrition will be maintained Outcome: Completed/Met   Problem: Coping: Goal: Level of anxiety will decrease Outcome: Progressing   Problem: Elimination: Goal: Will not experience complications related to bowel motility Outcome: Progressing Goal: Will not experience complications related to urinary retention Outcome: Progressing   Problem: Pain Management: Goal: General experience of comfort will improve Outcome: Progressing   Problem: Safety: Goal: Ability to remain free from injury will improve Outcome: Progressing   Problem: Skin Integrity: Goal: Risk for impaired skin integrity will decrease Outcome: Progressing   Problem: Education: Goal: Knowledge of the prescribed therapeutic regimen will improve Outcome: Progressing Goal: Understanding of discharge needs will improve Outcome: Progressing Goal: Individualized Educational Video(s) Outcome: Completed/Met   Problem: Activity: Goal: Ability to avoid complications of mobility impairment will improve Outcome: Progressing Goal: Ability to tolerate increased  activity will improve Outcome: Adequate for Discharge   Problem: Clinical Measurements: Goal: Postoperative complications will be avoided or minimized Outcome: Progressing   Problem: Pain Management: Goal: Pain level will decrease with appropriate interventions Outcome: Progressing   Problem: Skin Integrity: Goal: Will show signs of wound healing Outcome: Progressing

## 2023-04-25 ENCOUNTER — Encounter: Payer: Self-pay | Admitting: Urology

## 2023-04-25 NOTE — Patient Instructions (Signed)

## 2023-04-28 DIAGNOSIS — M25551 Pain in right hip: Secondary | ICD-10-CM | POA: Diagnosis not present

## 2023-04-30 ENCOUNTER — Ambulatory Visit: Payer: Medicare HMO

## 2023-05-05 DIAGNOSIS — M1611 Unilateral primary osteoarthritis, right hip: Secondary | ICD-10-CM | POA: Diagnosis not present

## 2023-05-14 ENCOUNTER — Ambulatory Visit (INDEPENDENT_AMBULATORY_CARE_PROVIDER_SITE_OTHER): Payer: Medicare HMO

## 2023-05-14 DIAGNOSIS — E291 Testicular hypofunction: Secondary | ICD-10-CM

## 2023-05-14 DIAGNOSIS — E782 Mixed hyperlipidemia: Secondary | ICD-10-CM | POA: Diagnosis not present

## 2023-05-14 DIAGNOSIS — I10 Essential (primary) hypertension: Secondary | ICD-10-CM | POA: Diagnosis not present

## 2023-05-14 MED ORDER — TESTOSTERONE CYPIONATE 200 MG/ML IM SOLN
200.0000 mg | Freq: Once | INTRAMUSCULAR | Status: AC
  Administered 2023-05-14: 200 mg via INTRAMUSCULAR

## 2023-05-14 NOTE — Progress Notes (Signed)
Testosterone IM Injection  Due to Hypogonadism patient is present today for a Testosterone Injection.  Medication: Testosterone Cypionate Dose: 1mL Location: right upper outer buttocks Lot: [id number] Exp:07/12/2025  Patient tolerated well, no complications were noted  Performed by: Alfonse Spruce. CMA  Follow up: As scheduled

## 2023-05-19 DIAGNOSIS — G4733 Obstructive sleep apnea (adult) (pediatric): Secondary | ICD-10-CM | POA: Diagnosis not present

## 2023-05-20 DIAGNOSIS — M1711 Unilateral primary osteoarthritis, right knee: Secondary | ICD-10-CM | POA: Diagnosis not present

## 2023-05-20 DIAGNOSIS — G894 Chronic pain syndrome: Secondary | ICD-10-CM | POA: Diagnosis not present

## 2023-05-28 ENCOUNTER — Ambulatory Visit: Payer: Medicare Other

## 2023-05-28 DIAGNOSIS — E291 Testicular hypofunction: Secondary | ICD-10-CM | POA: Diagnosis not present

## 2023-05-28 MED ORDER — TESTOSTERONE CYPIONATE 200 MG/ML IM SOLN
200.0000 mg | Freq: Once | INTRAMUSCULAR | Status: AC
  Administered 2023-05-28: 200 mg via INTRAMUSCULAR

## 2023-05-28 NOTE — Progress Notes (Signed)
Patient receiving Testosterone injection per MD order  The injection site was cleaned and prepped with alcohol. A band aid applied after injection given.   Testosterone injection  Medication: Testosterone Cypionate Dose: 1mL Location: left anterior thigh Lot: [id number] Exp: 05/14/2025  Patient tolerated well, no complications were noted  Performed by: Gwendolyn Grant T. CMA

## 2023-06-11 ENCOUNTER — Ambulatory Visit: Payer: Medicare Other

## 2023-06-11 DIAGNOSIS — I5032 Chronic diastolic (congestive) heart failure: Secondary | ICD-10-CM | POA: Diagnosis not present

## 2023-06-11 DIAGNOSIS — K5903 Drug induced constipation: Secondary | ICD-10-CM | POA: Diagnosis not present

## 2023-06-11 DIAGNOSIS — J449 Chronic obstructive pulmonary disease, unspecified: Secondary | ICD-10-CM | POA: Diagnosis not present

## 2023-06-16 DIAGNOSIS — G4733 Obstructive sleep apnea (adult) (pediatric): Secondary | ICD-10-CM | POA: Diagnosis not present

## 2023-06-17 DIAGNOSIS — G894 Chronic pain syndrome: Secondary | ICD-10-CM | POA: Diagnosis not present

## 2023-06-25 ENCOUNTER — Ambulatory Visit: Payer: Medicare HMO

## 2023-06-25 ENCOUNTER — Other Ambulatory Visit: Payer: Self-pay

## 2023-07-09 ENCOUNTER — Ambulatory Visit (INDEPENDENT_AMBULATORY_CARE_PROVIDER_SITE_OTHER): Payer: Medicare HMO

## 2023-07-09 DIAGNOSIS — E291 Testicular hypofunction: Secondary | ICD-10-CM

## 2023-07-09 MED ORDER — TESTOSTERONE CYPIONATE 200 MG/ML IM SOLN
200.0000 mg | Freq: Once | INTRAMUSCULAR | Status: AC
  Administered 2023-07-09: 200 mg via INTRAMUSCULAR

## 2023-07-09 NOTE — Progress Notes (Addendum)
 Patient receiving Testosterone injection per MD order  The injection site was cleaned and prepped with alcohol. A band aid applied after injection given.   Testosterone IM injection  Medication: testosterone Dose: 1mL Location: left anterior thigh Lot: [id number] Exp: 05/14/2025  Patient tolerated well, no complications were noted  Performed by: Gwendolyn Grant T. CMA

## 2023-07-16 DIAGNOSIS — R600 Localized edema: Secondary | ICD-10-CM | POA: Diagnosis not present

## 2023-07-16 DIAGNOSIS — I10 Essential (primary) hypertension: Secondary | ICD-10-CM | POA: Diagnosis not present

## 2023-07-16 DIAGNOSIS — G894 Chronic pain syndrome: Secondary | ICD-10-CM | POA: Diagnosis not present

## 2023-07-16 DIAGNOSIS — D6869 Other thrombophilia: Secondary | ICD-10-CM | POA: Diagnosis not present

## 2023-07-17 DIAGNOSIS — G4733 Obstructive sleep apnea (adult) (pediatric): Secondary | ICD-10-CM | POA: Diagnosis not present

## 2023-07-22 ENCOUNTER — Telehealth: Payer: Self-pay | Admitting: Urology

## 2023-07-22 NOTE — Telephone Encounter (Signed)
 Patient called and states he went to have blood work done the Thursday after his injection.  Robbie Lis called and is faxing over labs.

## 2023-07-22 NOTE — Telephone Encounter (Signed)
 Patient called he said that he had lab work done at [facility] and they are saying his testosterone level  was 1024, do you want him to come tomorrow to get his injection ?

## 2023-07-23 ENCOUNTER — Ambulatory Visit: Payer: Medicare HMO | Admitting: Urology

## 2023-07-23 VITALS — BP 120/68 | HR 60

## 2023-07-23 DIAGNOSIS — E291 Testicular hypofunction: Secondary | ICD-10-CM

## 2023-07-23 DIAGNOSIS — N529 Male erectile dysfunction, unspecified: Secondary | ICD-10-CM | POA: Diagnosis not present

## 2023-07-23 MED ORDER — TADALAFIL 20 MG PO TABS: 20.0000 mg | ORAL_TABLET | ORAL | 5 refills | Status: DC | PRN

## 2023-07-23 MED ORDER — TESTOSTERONE CYPIONATE 200 MG/ML IM SOLN
200.0000 mg | Freq: Once | INTRAMUSCULAR | Status: AC
  Administered 2023-07-23: 200 mg via INTRAMUSCULAR

## 2023-07-23 MED ORDER — TESTOSTERONE CYPIONATE 200 MG/ML IM SOLN: 200.0000 mg | INTRAMUSCULAR | 1 refills | Status: DC

## 2023-07-23 NOTE — Progress Notes (Signed)
 07/23/2023 9:41 AM   Calvin Henry April 06, 1961 [id number]  Referring provider: Roxene Cora, PA-C [address],  [place] [address]  Followup hypogonadism   HPI: Calvin Henry is a 62yo here for followup for hypogonadism and erectiel dysfunction. Testosterone  1240 2-3 days after injection. Hemoglobin 16. Good energy. Good libido. He continues to have issues getting and maintaining an erection.    PMH: Past Medical History:  Diagnosis Date   A-fib (HCC)    Acid reflux    Arthritis    Atrial flutter (HCC)    a. diagnosed in 02/2017 with spontaneous conversion back to NSR.    CAD (coronary artery disease)    Chronic back pain    CKD (chronic kidney disease) stage 3, GFR 30-59 ml/min (HCC)    Essential hypertension    H. pylori infection    Treated with Prevpac   Hemorrhoids    PSVT (paroxysmal supraventricular tachycardia) (HCC)    a. s/p ablation in 12/2016   Restrictive cardiomyopathy (HCC)    Tubular adenoma     Surgical History: Past Surgical History:  Procedure Laterality Date   BIOPSY  11/26/2022   Procedure: BIOPSY;  Surgeon: Suzette Espy, MD;  [facility];  Service: Endoscopy;;   COLONOSCOPY  08/07/10   friable anal canal otherwise normal   COLONOSCOPY [name] 03/18/2017   Procedure: COLONOSCOPY;  Surgeon: Suzette Espy, MD;  [facility];  Service: Endoscopy;  Laterality: [name];  1:15pm   COLONOSCOPY WITH ESOPHAGOGASTRODUODENOSCOPY (EGD) [name] 07/06/2013   Dr. Riley Cheadle- inflamed hemorrhoids- multiple colonic polyps= tubular adenoma. stomach bx= hpylori treated with prevpac   COLONOSCOPY WITH PROPOFOL  [name] 11/26/2022   Procedure: COLONOSCOPY WITH PROPOFOL ;  Surgeon: Suzette Espy, MD;  [facility];  Service: Endoscopy;  Laterality: [name];  10:00am, asa 3   ESOPHAGOGASTRODUODENOSCOPY  08/07/10   schatzkis ring otherwise normal, s/p 56-F dilation, small hiatal hernia.chronic active gastritis on bx    ESOPHAGOGASTRODUODENOSCOPY (EGD) WITH PROPOFOL  [name] 11/26/2022   Procedure: ESOPHAGOGASTRODUODENOSCOPY (EGD) WITH PROPOFOL ;  Surgeon: Suzette Espy, MD;  [facility];  Service: Endoscopy;  Laterality: [name];   HEMORRHOID BANDING     HERNIA REPAIR     HYDROCELE EXCISION Bilateral 08/20/2022   Procedure: HYDROCELECTOMY ADULT;  Surgeon: Marco Severs, MD;  [facility]: [facility];  Service: Urology;  Laterality: Bilateral;   MALONEY DILATION [name] 11/26/2022   Procedure: Londa Rival DILATION;  Surgeon: Suzette Espy, MD;  [facility];  Service: Endoscopy;  Laterality: [name];   POLYPECTOMY  11/26/2022   Procedure: POLYPECTOMY;  Surgeon: Suzette Espy, MD;  [facility];  Service: Endoscopy;;   SVT ABLATION [name] 12/15/2016   Procedure: SVT Ablation;  Surgeon: Jolly Needle, MD;  [facility];  Service: Cardiovascular;  Laterality: [name];   TOTAL HIP ARTHROPLASTY Left 10/04/2015   Procedure: LEFT TOTAL HIP ARTHROPLASTY ANTERIOR APPROACH;  Surgeon: Arnie Lao, MD;  Location: [facility];  Service: Orthopedics;  Laterality: Left;   TOTAL HIP ARTHROPLASTY Right 04/20/2023   Procedure: TOTAL HIP ARTHROPLASTY;  Surgeon: Saundra Curl, MD;  Location: [facility];  Service: Orthopedics;  Laterality: Right;   UMBILICAL HERNIA REPAIR  08/05/2011   Procedure: HERNIA REPAIR UMBILICAL ADULT;  Surgeon: Beau Bound, MD;  Location: [facility];  Service: General;  Laterality: [name];    Home Medications:  Allergies as of 07/23/2023   No Known Allergies      Medication List  Accurate as of July 23, 2023  9:41 AM. If you have any questions, ask your nurse or doctor.          acetaminophen  500 MG tablet Commonly known as: TYLENOL  Take 1,000 mg by mouth every 6 (six) hours as needed for moderate pain.   allopurinol  100 MG tablet Commonly known as: ZYLOPRIM  Take 100 mg by mouth daily.   amLODipine  5 MG tablet Commonly known as: NORVASC  Take 5 mg by mouth  daily.   apixaban  5 MG Tabs tablet Commonly known as: ELIQUIS  Take 5 mg by mouth 2 (two) times daily.   atorvastatin  80 MG tablet Commonly known as: LIPITOR Take 80 mg by mouth daily.   chlorthalidone 25 MG tablet Commonly known as: HYGROTON Take 12.5 mg by mouth every Monday, Wednesday, and Friday.   furosemide  20 MG tablet Commonly known as: LASIX  Take 20 mg by mouth daily.   methocarbamol  750 MG tablet Commonly known as: Robaxin -750 Take 1 tablet (750 mg total) by mouth every 8 (eight) hours as needed for muscle spasms.   metoprolol  succinate 50 MG 24 hr tablet Commonly known as: TOPROL -XL TAKE 1 TABLET BY MOUTH DAILY   NEEDLE (DISP) 18 G 18G X 1-1/2" Misc Use to draw up testosterone    olmesartan 20 MG tablet Commonly known as: BENICAR Take 20 mg by mouth daily.   ondansetron  4 MG disintegrating tablet Commonly known as: ZOFRAN -ODT Take 1 tablet (4 mg total) by mouth every 8 (eight) hours as needed for nausea or vomiting.   oxyCODONE  5 MG immediate release tablet Commonly known as: Roxicodone  Take 1 tablet (5 mg total) by mouth every 6 (six) hours as needed for breakthrough pain (after surgery that is not resolved by first taking your normal daily dose of Percocet).   oxyCODONE -acetaminophen  10-325 MG tablet Commonly known as: PERCOCET Take 1 tablet by mouth 4 (four) times daily as needed for pain.   pantoprazole  40 MG tablet Commonly known as: PROTONIX  Take 1 tablet (40 mg total) by mouth daily.   SYRINGE-NEEDLE (DISP) 3 ML 22G X 1-1/2" 3 ML Misc Use to inject testosterone    testosterone  cypionate 200 MG/ML injection Commonly known as: DEPOTESTOSTERONE CYPIONATE Inject 1 mL (200 mg total) into the muscle every 14 (fourteen) days.   Vitamin D3 Super Strength 50 MCG (2000 UT) Caps Generic drug: Cholecalciferol Take 1 capsule by mouth daily.        Allergies: No Known Allergies  Family History: Family History  Problem Relation Age of Onset    Diabetes Mother    Hypertension Mother    Colon cancer Neg Hx     Social History:  reports that he quit smoking about 36 years ago. His smoking use included cigars and cigarettes. He started smoking about 46 years ago. He has a 5 pack-year smoking history. He has never used smokeless tobacco. He reports that he does not currently use alcohol. He reports that he does not use drugs.  ROS: All other review of systems were reviewed and are negative except what is noted above in HPI  Physical Exam: BP 120/68   Pulse 60   Constitutional:  Alert and oriented, No acute distress. HEENT: [place] AT, moist mucus membranes.  Trachea midline, no masses. Cardiovascular: No clubbing, cyanosis, or edema. Respiratory: Normal respiratory effort, no increased work of breathing. GI: Abdomen is soft, nontender, nondistended, no abdominal masses GU: No CVA tenderness.  Lymph: No cervical or inguinal lymphadenopathy. Skin: No rashes, bruises or suspicious lesions. Neurologic: Grossly  intact, no focal deficits, moving all 4 extremities. Psychiatric: Normal mood and affect.  Laboratory Data: Lab Results  Component Value Date   WBC 5.1 03/30/2023   HGB 16.1 03/30/2023   HCT 49.6 03/30/2023   MCV 92 03/30/2023   PLT 217 03/30/2023    Lab Results  Component Value Date   CREATININE 1.60 (H) 03/30/2023    No results found for: "PSA"  Lab Results  Component Value Date   TESTOSTERONE  111 (L) 03/30/2023    No results found for: "HGBA1C"  Urinalysis    Component Value Date/Time   COLORURINE YELLOW 08/17/2013 1403   APPEARANCEUR Clear 04/16/2023 0929   LABSPEC 1.020 08/17/2013 1403   PHURINE 5.5 08/17/2013 1403   GLUCOSEU Negative 04/16/2023 0929   HGBUR NEGATIVE 08/17/2013 1403   BILIRUBINUR Negative 04/16/2023 0929   KETONESUR NEGATIVE 08/17/2013 1403   PROTEINUR Negative 04/16/2023 0929   PROTEINUR NEGATIVE 08/17/2013 1403   UROBILINOGEN 0.2 08/17/2013 1403   NITRITE Negative 04/16/2023 0929    NITRITE NEGATIVE 08/17/2013 1403   LEUKOCYTESUR Negative 04/16/2023 0929    Lab Results  Component Value Date   LABMICR Comment 04/16/2023    Pertinent Imaging:  No results found for this or any previous visit.  No results found for this or any previous visit.  No results found for this or any previous visit.  No results found for this or any previous visit.  No results found for this or any previous visit.  No results found for this or any previous visit.  No results found for this or any previous visit.  No results found for this or any previous visit.   Assessment & Plan:    1. Hypogonadism male (Primary) Continue IM testosterone  200mg  every 14 days Followup 3 months with testosterone  labs  2. Erectile dysfunction, unspecified erectile dysfunction type Tadalafil  20mg  prn   No follow-ups on file.  Johnie Nailer, MD  [facility] [place]

## 2023-07-24 LAB — ESTRADIOL: Estradiol: 56.6 pg/mL — ABNORMAL HIGH (ref 7.6–42.6)

## 2023-08-03 ENCOUNTER — Encounter: Payer: Self-pay | Admitting: Urology

## 2023-08-03 DIAGNOSIS — I129 Hypertensive chronic kidney disease with stage 1 through stage 4 chronic kidney disease, or unspecified chronic kidney disease: Secondary | ICD-10-CM | POA: Diagnosis not present

## 2023-08-03 DIAGNOSIS — E785 Hyperlipidemia, unspecified: Secondary | ICD-10-CM | POA: Diagnosis not present

## 2023-08-03 DIAGNOSIS — N181 Chronic kidney disease, stage 1: Secondary | ICD-10-CM | POA: Diagnosis not present

## 2023-08-03 MED ORDER — ANASTROZOLE 1 MG PO TABS: 0.5000 mg | ORAL_TABLET | ORAL | 3 refills | Status: DC

## 2023-08-03 NOTE — Telephone Encounter (Signed)
-----   Message from Johnie Nailer sent at 08/03/2023  9:52 AM EDT ----- Estradiol  high. Please send in anastrazole 0.5mg  3x per week ----- Message ----- From: Interface, [facility] Lab Results In Sent: 07/24/2023   5:37 AM EDT To: Marco Severs, MD

## 2023-08-03 NOTE — Telephone Encounter (Signed)
Patient called and made aware. Rx sent. ?

## 2023-08-03 NOTE — Patient Instructions (Signed)

## 2023-08-06 ENCOUNTER — Ambulatory Visit

## 2023-08-06 DIAGNOSIS — E291 Testicular hypofunction: Secondary | ICD-10-CM | POA: Diagnosis not present

## 2023-08-06 MED ORDER — TESTOSTERONE CYPIONATE 200 MG/ML IM SOLN
200.0000 mg | Freq: Once | INTRAMUSCULAR | Status: AC
  Administered 2023-08-06: 200 mg via INTRAMUSCULAR

## 2023-08-06 NOTE — Progress Notes (Signed)
 Patient receiving Testosterone  injection per MD order  The injection site was cleaned and prepped with alcohol. A band aid applied after injection given.   IM injection  Medication: Testosterone  Dose: 200 mg/1 ml Location: Right  anterior thigh Lot: [id number] Exp: 02/10/2026  Patient tolerated well, no complications were noted  Performed by: Gorden Latino, CMA

## 2023-08-11 DIAGNOSIS — I5032 Chronic diastolic (congestive) heart failure: Secondary | ICD-10-CM | POA: Diagnosis not present

## 2023-08-11 DIAGNOSIS — E782 Mixed hyperlipidemia: Secondary | ICD-10-CM | POA: Diagnosis not present

## 2023-08-11 DIAGNOSIS — I4819 Other persistent atrial fibrillation: Secondary | ICD-10-CM | POA: Diagnosis not present

## 2023-08-11 DIAGNOSIS — J449 Chronic obstructive pulmonary disease, unspecified: Secondary | ICD-10-CM | POA: Diagnosis not present

## 2023-08-13 DIAGNOSIS — I4819 Other persistent atrial fibrillation: Secondary | ICD-10-CM | POA: Diagnosis not present

## 2023-08-13 DIAGNOSIS — Z7901 Long term (current) use of anticoagulants: Secondary | ICD-10-CM | POA: Diagnosis not present

## 2023-08-13 DIAGNOSIS — G894 Chronic pain syndrome: Secondary | ICD-10-CM | POA: Diagnosis not present

## 2023-08-16 DIAGNOSIS — G4733 Obstructive sleep apnea (adult) (pediatric): Secondary | ICD-10-CM | POA: Diagnosis not present

## 2023-08-20 ENCOUNTER — Ambulatory Visit

## 2023-08-29 ENCOUNTER — Other Ambulatory Visit: Payer: Self-pay | Admitting: Urology

## 2023-09-10 ENCOUNTER — Ambulatory Visit

## 2023-09-10 DIAGNOSIS — E291 Testicular hypofunction: Secondary | ICD-10-CM

## 2023-09-10 DIAGNOSIS — G4733 Obstructive sleep apnea (adult) (pediatric): Secondary | ICD-10-CM | POA: Diagnosis not present

## 2023-09-10 MED ORDER — TESTOSTERONE CYPIONATE 200 MG/ML IM SOLN
200.0000 mg | Freq: Once | INTRAMUSCULAR | Status: AC
Start: 2023-09-10 — End: 2023-09-10
  Administered 2023-09-10: 200 mg via INTRAMUSCULAR

## 2023-09-10 NOTE — Progress Notes (Signed)
 Patient receiving Testosterone  injection per MD order  The injection site was cleaned and prepped with alcohol. A band aid applied after injection given.   IM injection  Medication: Testosterone  Dose: 1mL Location: left anterior thigh Lot: [id number] Exp: 03/13/2025  Patient tolerated well, no complications were noted  Performed by: Gillian Lacrosse T. CMA

## 2023-09-16 DIAGNOSIS — M1711 Unilateral primary osteoarthritis, right knee: Secondary | ICD-10-CM | POA: Diagnosis not present

## 2023-09-16 DIAGNOSIS — R600 Localized edema: Secondary | ICD-10-CM | POA: Diagnosis not present

## 2023-09-16 DIAGNOSIS — I4819 Other persistent atrial fibrillation: Secondary | ICD-10-CM | POA: Diagnosis not present

## 2023-09-16 DIAGNOSIS — I482 Chronic atrial fibrillation, unspecified: Secondary | ICD-10-CM | POA: Diagnosis not present

## 2023-09-16 DIAGNOSIS — M1611 Unilateral primary osteoarthritis, right hip: Secondary | ICD-10-CM | POA: Diagnosis not present

## 2023-09-16 DIAGNOSIS — G4733 Obstructive sleep apnea (adult) (pediatric): Secondary | ICD-10-CM | POA: Diagnosis not present

## 2023-09-23 ENCOUNTER — Ambulatory Visit

## 2023-09-23 DIAGNOSIS — E291 Testicular hypofunction: Secondary | ICD-10-CM | POA: Diagnosis not present

## 2023-09-23 DIAGNOSIS — I10 Essential (primary) hypertension: Secondary | ICD-10-CM | POA: Diagnosis not present

## 2023-09-23 DIAGNOSIS — I209 Angina pectoris, unspecified: Secondary | ICD-10-CM | POA: Diagnosis not present

## 2023-09-23 DIAGNOSIS — I4819 Other persistent atrial fibrillation: Secondary | ICD-10-CM | POA: Diagnosis not present

## 2023-09-23 DIAGNOSIS — N1831 Chronic kidney disease, stage 3a: Secondary | ICD-10-CM | POA: Diagnosis not present

## 2023-09-23 DIAGNOSIS — E785 Hyperlipidemia, unspecified: Secondary | ICD-10-CM | POA: Diagnosis not present

## 2023-09-23 MED ORDER — TESTOSTERONE CYPIONATE 200 MG/ML IM SOLN
200.0000 mg | Freq: Once | INTRAMUSCULAR | Status: AC
Start: 2023-09-23 — End: 2023-09-23
  Administered 2023-09-23: 200 mg via INTRAMUSCULAR

## 2023-09-23 NOTE — Progress Notes (Signed)
 Patient receiving Testosterone  injection per MD order  The injection site was cleaned and prepped with alcohol. A band aid applied after injection given.   IM injection  Medication: Testosterone   Dose: 1 ML Location: right anterior thigh Lot: [id number] Exp: 03/2025  Patient tolerated well, no complications were noted  Performed by: Gorden Latino, CMA

## 2023-10-06 ENCOUNTER — Ambulatory Visit

## 2023-10-08 ENCOUNTER — Ambulatory Visit (INDEPENDENT_AMBULATORY_CARE_PROVIDER_SITE_OTHER)

## 2023-10-08 DIAGNOSIS — E291 Testicular hypofunction: Secondary | ICD-10-CM | POA: Diagnosis not present

## 2023-10-08 MED ORDER — TESTOSTERONE CYPIONATE 200 MG/ML IM SOLN
200.0000 mg | Freq: Once | INTRAMUSCULAR | Status: AC
  Administered 2023-10-08: 200 mg via INTRAMUSCULAR

## 2023-10-08 NOTE — Progress Notes (Signed)
 Patient receiving IM injection per MD order  The injection site was cleaned and prepped with alcohol. A band aid applied after injection given.    Medication: testosterone  Dose: 1 ML Location: left thigh Lot: [id number]  Exp: 01/2026  Patient tolerated well, no complications were noted  Performed by: Veleria, CMA

## 2023-10-21 ENCOUNTER — Other Ambulatory Visit

## 2023-10-21 DIAGNOSIS — I482 Chronic atrial fibrillation, unspecified: Secondary | ICD-10-CM | POA: Diagnosis not present

## 2023-10-21 DIAGNOSIS — E291 Testicular hypofunction: Secondary | ICD-10-CM

## 2023-10-21 DIAGNOSIS — E782 Mixed hyperlipidemia: Secondary | ICD-10-CM | POA: Diagnosis not present

## 2023-10-21 DIAGNOSIS — G894 Chronic pain syndrome: Secondary | ICD-10-CM | POA: Diagnosis not present

## 2023-10-21 DIAGNOSIS — R600 Localized edema: Secondary | ICD-10-CM | POA: Diagnosis not present

## 2023-10-21 DIAGNOSIS — I10 Essential (primary) hypertension: Secondary | ICD-10-CM | POA: Diagnosis not present

## 2023-10-22 LAB — COMPREHENSIVE METABOLIC PANEL WITH GFR
ALT: 76 IU/L — ABNORMAL HIGH (ref 0–44)
AST: 63 IU/L — ABNORMAL HIGH (ref 0–40)
Albumin: 4.1 g/dL (ref 3.9–4.9)
Alkaline Phosphatase: 106 IU/L (ref 44–121)
BUN/Creatinine Ratio: 8 — ABNORMAL LOW (ref 10–24)
BUN: 14 mg/dL (ref 8–27)
Bilirubin Total: 0.4 mg/dL (ref 0.0–1.2)
CO2: 24 mmol/L (ref 20–29)
Calcium: 9.5 mg/dL (ref 8.6–10.2)
Chloride: 102 mmol/L (ref 96–106)
Creatinine, Ser: 1.69 mg/dL — ABNORMAL HIGH (ref 0.76–1.27)
Globulin, Total: 2.8 g/dL (ref 1.5–4.5)
Glucose: 104 mg/dL — ABNORMAL HIGH (ref 70–99)
Potassium: 4.2 mmol/L (ref 3.5–5.2)
Sodium: 142 mmol/L (ref 134–144)
Total Protein: 6.9 g/dL (ref 6.0–8.5)
eGFR: 45 mL/min/1.73 — ABNORMAL LOW (ref >59)

## 2023-10-22 LAB — CBC
Hematocrit: 51.8 % — ABNORMAL HIGH (ref 37.5–51.0)
Hemoglobin: 16.6 g/dL (ref 13.0–17.7)
MCH: 27.9 pg (ref 26.6–33.0)
MCHC: 32 g/dL (ref 31.5–35.7)
MCV: 87 fL (ref 79–97)
Platelets: 238 x10E3/uL (ref 150–450)
RBC: 5.95 x10E6/uL — ABNORMAL HIGH (ref 4.14–5.80)
RDW: 16.1 % — ABNORMAL HIGH (ref 11.6–15.4)
WBC: 5.8 x10E3/uL (ref 3.4–10.8)

## 2023-10-22 LAB — TESTOSTERONE,FREE AND TOTAL
Testosterone, Free: 18.2 pg/mL — ABNORMAL HIGH (ref 6.6–18.1)
Testosterone: 495 ng/dL (ref 264–916)

## 2023-10-26 ENCOUNTER — Ambulatory Visit: Payer: Self-pay | Admitting: Urology

## 2023-10-26 ENCOUNTER — Other Ambulatory Visit

## 2023-10-29 ENCOUNTER — Ambulatory Visit: Admitting: Urology

## 2023-10-29 DIAGNOSIS — E291 Testicular hypofunction: Secondary | ICD-10-CM

## 2023-11-01 ENCOUNTER — Other Ambulatory Visit: Payer: Self-pay | Admitting: Internal Medicine

## 2023-11-04 ENCOUNTER — Ambulatory Visit

## 2023-11-17 DIAGNOSIS — M1711 Unilateral primary osteoarthritis, right knee: Secondary | ICD-10-CM | POA: Diagnosis not present

## 2023-11-17 DIAGNOSIS — M1611 Unilateral primary osteoarthritis, right hip: Secondary | ICD-10-CM | POA: Diagnosis not present

## 2023-11-17 DIAGNOSIS — I10 Essential (primary) hypertension: Secondary | ICD-10-CM | POA: Diagnosis not present

## 2023-11-17 DIAGNOSIS — J449 Chronic obstructive pulmonary disease, unspecified: Secondary | ICD-10-CM | POA: Diagnosis not present

## 2023-11-17 DIAGNOSIS — Z7901 Long term (current) use of anticoagulants: Secondary | ICD-10-CM | POA: Diagnosis not present

## 2023-11-17 DIAGNOSIS — I482 Chronic atrial fibrillation, unspecified: Secondary | ICD-10-CM | POA: Diagnosis not present

## 2023-11-18 ENCOUNTER — Ambulatory Visit

## 2023-11-23 ENCOUNTER — Ambulatory Visit

## 2023-11-24 ENCOUNTER — Telehealth: Payer: Self-pay

## 2023-11-24 NOTE — Telephone Encounter (Signed)
 Patient is made aware Testosterone  injection has been moved and booked with his current office visited for 12/09/23. Patient voiced understanding.

## 2023-11-30 ENCOUNTER — Other Ambulatory Visit: Payer: Self-pay | Admitting: Urology

## 2023-12-02 ENCOUNTER — Ambulatory Visit

## 2023-12-03 ENCOUNTER — Encounter: Payer: Self-pay | Admitting: Radiology

## 2023-12-08 ENCOUNTER — Ambulatory Visit: Admitting: Urology

## 2023-12-08 ENCOUNTER — Encounter: Payer: Self-pay | Admitting: Urology

## 2023-12-08 VITALS — BP 125/74 | HR 69

## 2023-12-08 DIAGNOSIS — E291 Testicular hypofunction: Secondary | ICD-10-CM

## 2023-12-08 DIAGNOSIS — N529 Male erectile dysfunction, unspecified: Secondary | ICD-10-CM

## 2023-12-08 MED ORDER — TESTOSTERONE CYPIONATE 200 MG/ML IM SOLN
200.0000 mg | Freq: Once | INTRAMUSCULAR | Status: AC
  Administered 2023-12-08: 200 mg via INTRAMUSCULAR

## 2023-12-08 MED ORDER — TADALAFIL 20 MG PO TABS: 20.0000 mg | ORAL_TABLET | ORAL | 5 refills | Status: DC | PRN

## 2023-12-08 MED ORDER — ANASTROZOLE 1 MG PO TABS: 0.5000 mg | ORAL_TABLET | Freq: Every day | ORAL | 3 refills | Status: DC

## 2023-12-08 MED ORDER — TESTOSTERONE CYPIONATE 200 MG/ML IM SOLN: 250.0000 mg | INTRAMUSCULAR | 1 refills | Status: DC

## 2023-12-08 NOTE — Progress Notes (Signed)
 Patient receiving Testosterone   injection per MD order  The injection site was cleaned and prepped with alcohol. A band aid applied after injection given.   IM  injection  Medication: Testosterone  Dose: 200 ML Per Dr. Sherrilee increase to 1.25 ML Location: left thigh Lot: [id number] Exp: 02/10/2026  Patient tolerated well, no complications were noted  Performed by: Carlos, CMA

## 2023-12-08 NOTE — Progress Notes (Unsigned)
 12/08/2023 1:15 PM   Calvin Henry 30-Dec-1960 [id number]  Referring provider: Leonce Lucie PARAS, PA-C [address] [place],  [place] [address]  Followup hypogonadism   HPI: [name] is a 63yo here for followup for hypogonadism and erectile dysfunction. He does IM testosterone  200mg   every 2 weeks. Testosterone  493, hemoglobin 16.6, CMP normal. He continues to have fatigue. He continues to have difficulty getting an erection and maintaining his erection. He takes tadalafil  10mg  prn which works well but he has difficulty ejaculating.    PMH: Past Medical History:  Diagnosis Date   A-fib (HCC)    Acid reflux    Arthritis    Atrial flutter (HCC)    a. diagnosed in 02/2017 with spontaneous conversion back to NSR.    CAD (coronary artery disease)    Chronic back pain    CKD (chronic kidney disease) stage 3, GFR 30-59 ml/min (HCC)    Essential hypertension    H. pylori infection    Treated with Prevpac   Hemorrhoids    PSVT (paroxysmal supraventricular tachycardia) (HCC)    a. s/p ablation in 12/2016   Restrictive cardiomyopathy (HCC)    Tubular adenoma     Surgical History: Past Surgical History:  Procedure Laterality Date   BIOPSY  11/26/2022   Procedure: BIOPSY;  Surgeon: Calvin Lamar HERO, MD;  [facility];  Service: Endoscopy;;   COLONOSCOPY  08/07/10   friable anal canal otherwise normal   COLONOSCOPY [name] 03/18/2017   Procedure: COLONOSCOPY;  Surgeon: Calvin Lamar HERO, MD;  [facility];  Service: Endoscopy;  Laterality: [name];  1:15pm   COLONOSCOPY WITH ESOPHAGOGASTRODUODENOSCOPY (EGD) [name] 07/06/2013   Dr. Shaaron- inflamed hemorrhoids- multiple colonic polyps= tubular adenoma. stomach bx= hpylori treated with prevpac   COLONOSCOPY WITH PROPOFOL  [name] 11/26/2022   Procedure: COLONOSCOPY WITH PROPOFOL ;  Surgeon: Calvin Lamar HERO, MD;  [facility];  Service: Endoscopy;  Laterality: [name];  10:00am, asa 3   ESOPHAGOGASTRODUODENOSCOPY   08/07/10   schatzkis ring otherwise normal, s/p 56-F dilation, small hiatal hernia.chronic active gastritis on bx   ESOPHAGOGASTRODUODENOSCOPY (EGD) WITH PROPOFOL  [name] 11/26/2022   Procedure: ESOPHAGOGASTRODUODENOSCOPY (EGD) WITH PROPOFOL ;  Surgeon: Calvin Lamar HERO, MD;  [facility];  Service: Endoscopy;  Laterality: [name];   HEMORRHOID BANDING     HERNIA REPAIR     HYDROCELE EXCISION Bilateral 08/20/2022   Procedure: HYDROCELECTOMY ADULT;  Surgeon: Calvin Belvie CROME, MD;  [facility]: [facility];  Service: Urology;  Laterality: Bilateral;   MALONEY DILATION [name] 11/26/2022   Procedure: Calvin Henry DILATION;  Surgeon: Calvin Lamar HERO, MD;  [facility];  Service: Endoscopy;  Laterality: [name];   POLYPECTOMY  11/26/2022   Procedure: POLYPECTOMY;  Surgeon: Calvin Lamar HERO, MD;  [facility];  Service: Endoscopy;;   SVT ABLATION [name] 12/15/2016   Procedure: SVT Ablation;  Surgeon: Calvin Agent, MD;  [facility];  Service: Cardiovascular;  Laterality: [name];   TOTAL HIP ARTHROPLASTY Left 10/04/2015   Procedure: LEFT TOTAL HIP ARTHROPLASTY ANTERIOR APPROACH;  Surgeon: Calvin CINDERELLA Poli, MD;  [facility];  Service: Orthopedics;  Laterality: Left;   TOTAL HIP ARTHROPLASTY Right 04/20/2023   Procedure: TOTAL HIP ARTHROPLASTY;  Surgeon: Calvin Evalene BIRCH, MD;  [facility];  Service: Orthopedics;  Laterality: Right;   UMBILICAL HERNIA REPAIR  08/05/2011   Procedure: HERNIA REPAIR UMBILICAL ADULT;  Surgeon: Calvin DELENA Budge, MD;  Location: [facility];  Service: General;  Laterality:  [name];    Home Medications:  Allergies as of 12/08/2023   No Known Allergies      Medication List        Accurate as of December 08, 2023  1:15 PM. If you have any questions, ask your nurse or doctor.          acetaminophen  500 MG tablet Commonly known as: TYLENOL  Take 1,000 mg by mouth every 6 (six) hours as needed for moderate pain.   allopurinol  100 MG tablet Commonly known as:  ZYLOPRIM  Take 100 mg by mouth daily.   amLODipine  5 MG tablet Commonly known as: NORVASC  Take 5 mg by mouth daily.   anastrozole  1 MG tablet Commonly known as: ARIMIDEX  TAKE 1/2 TABLET BY MOUTH Three times a week   apixaban  5 MG Tabs tablet Commonly known as: ELIQUIS  Take 5 mg by mouth 2 (two) times daily.   atorvastatin  80 MG tablet Commonly known as: LIPITOR Take 80 mg by mouth daily.   chlorthalidone 25 MG tablet Commonly known as: HYGROTON Take 12.5 mg by mouth every Monday, Wednesday, and Friday.   furosemide  20 MG tablet Commonly known as: LASIX  Take 20 mg by mouth daily.   methocarbamol  750 MG tablet Commonly known as: Robaxin -750 Take 1 tablet (750 mg total) by mouth every 8 (eight) hours as needed for muscle spasms.   metoprolol  succinate 50 MG 24 hr tablet Commonly known as: TOPROL -XL TAKE 1 TABLET BY MOUTH DAILY   NEEDLE (DISP) 18 G 18G X 1-1/2 Misc Use to draw up testosterone    olmesartan 20 MG tablet Commonly known as: BENICAR Take 20 mg by mouth daily.   ondansetron  4 MG disintegrating tablet Commonly known as: ZOFRAN -ODT Take 1 tablet (4 mg total) by mouth every 8 (eight) hours as needed for nausea or vomiting.   oxyCODONE  5 MG immediate release tablet Commonly known as: Roxicodone  Take 1 tablet (5 mg total) by mouth every 6 (six) hours as needed for breakthrough pain (after surgery that is not resolved by first taking your normal daily dose of Percocet).   oxyCODONE -acetaminophen  10-325 MG tablet Commonly known as: PERCOCET Take 1 tablet by mouth 4 (four) times daily as needed for pain.   pantoprazole  40 MG tablet Commonly known as: PROTONIX  TAKE 1 TABLET BY MOUTH EVERY DAY   SYRINGE-NEEDLE (DISP) 3 ML 22G X 1-1/2 3 ML Misc Use to inject testosterone    tadalafil  20 MG tablet Commonly known as: CIALIS  Take 1 tablet (20 mg total) by mouth as needed.   testosterone  cypionate 200 MG/ML injection Commonly known as: DEPOTESTOSTERONE  CYPIONATE INJECT 1 ML INTO THE MUSCLE EVERY 14 DAYS.   Vitamin D3 Super Strength 50 MCG (2000 UT) Caps Generic drug: Cholecalciferol Take 1 capsule by mouth daily.        Allergies: No Known Allergies  Family History: Family History  Problem Relation Age of Onset   Diabetes Mother    Hypertension Mother    Colon cancer Neg Hx     Social History:  reports that he quit smoking about 37 years ago. His smoking use included cigars and cigarettes. He started smoking about 47 years ago. He has a 5 pack-year smoking history. He has never used smokeless tobacco. He reports that he does not currently use alcohol. He reports that he does not use drugs.  ROS: All other review of systems were reviewed and are negative except what is noted above in HPI  Physical Exam: BP 125/74   Pulse 69  Constitutional:  Alert and oriented, No acute distress. HEENT: [place] AT, moist mucus membranes.  Trachea midline, no masses. Cardiovascular: No clubbing, cyanosis, or edema. Respiratory: Normal respiratory effort, no increased work of breathing. GI: Abdomen is soft, nontender, nondistended, no abdominal masses GU: No CVA tenderness.  Lymph: No cervical or inguinal lymphadenopathy. Skin: No rashes, bruises or suspicious lesions. Neurologic: Grossly intact, no focal deficits, moving all 4 extremities. Psychiatric: Normal mood and affect.  Laboratory Data: Lab Results  Component Value Date   WBC 5.8 10/21/2023   HGB 16.6 10/21/2023   HCT 51.8 (H) 10/21/2023   MCV 87 10/21/2023   PLT 238 10/21/2023    Lab Results  Component Value Date   CREATININE 1.69 (H) 10/21/2023    No results found for: PSA  Lab Results  Component Value Date   TESTOSTERONE  495 10/21/2023    No results found for: HGBA1C  Urinalysis    Component Value Date/Time   COLORURINE YELLOW 08/17/2013 1403   APPEARANCEUR Clear 04/16/2023 0929   LABSPEC 1.020 08/17/2013 1403   PHURINE 5.5 08/17/2013 1403   GLUCOSEU  Negative 04/16/2023 0929   HGBUR NEGATIVE 08/17/2013 1403   BILIRUBINUR Negative 04/16/2023 0929   KETONESUR NEGATIVE 08/17/2013 1403   PROTEINUR Negative 04/16/2023 0929   PROTEINUR NEGATIVE 08/17/2013 1403   UROBILINOGEN 0.2 08/17/2013 1403   NITRITE Negative 04/16/2023 0929   NITRITE NEGATIVE 08/17/2013 1403   LEUKOCYTESUR Negative 04/16/2023 0929    Lab Results  Component Value Date   LABMICR Comment 04/16/2023    Pertinent Imaging:  No results found for this or any previous visit.  No results found for this or any previous visit.  No results found for this or any previous visit.  No results found for this or any previous visit.  No results found for this or any previous visit.  No results found for this or any previous visit.  No results found for this or any previous visit.  No results found for this or any previous visit.   Assessment & Plan:    1. Hypogonadism male (Primary) Increase testosterone  to 250mg  every 2 weeks -followup 6 months with labs -continue anastrazol 0.5mg  daily  2. Erectile dysfunction, unspecified erectile dysfunction type -continue tadalafil  10mg  prn   No follow-ups on file.  Belvie Clara, MD  [facility] [place]

## 2023-12-08 NOTE — Patient Instructions (Signed)
 Hypogonadism, Male  Male hypogonadism is a condition of having a level of testosterone  that is lower than normal. Testosterone  is a chemical, or hormone, that is made mainly in the testicles. In boys, testosterone  is responsible for the development of male characteristics during puberty. These include: Making the penis bigger. Growing and building the muscles. Growing facial hair. Deepening the voice. In adult men, testosterone  is responsible for maintaining: An interest in sex and the ability to have sex. Muscle mass. Sperm production. Red blood cell production. Bone strength. Testosterone  also gives men energy and a sense of well-being. Testosterone  normally decreases as men age and the testicles make less testosterone . Testosterone  levels can vary from man to man. Not all men will have signs and symptoms of low testosterone . Weight, alcohol use, medicines, and certain medical conditions can affect a man's testosterone  level. What are the causes? This condition is caused by: A natural decrease in testosterone  that occurs as a man grows older. This is the main cause of this condition. Use of medicines, such as antidepressants, steroids, and opioids. Diseases and conditions that affect the testicles or the making of testosterone . These include: Injury or damage to the testicles from trauma, cancer, cancer treatment, or infection. Diabetes. Sleep apnea. Genetic conditions that men are born with. Disease of the pituitary gland. This gland is in the brain. It produces hormones. Obesity. Metabolic syndrome. This is a group of diseases that affect blood pressure, blood sugar, cholesterol, and belly fat. HIV or AIDS. Alcohol abuse. Kidney failure. Other long-term or chronic diseases. What are the signs or symptoms? Common symptoms of this condition include: Loss of interest in sex (low sex drive). Inability to have or maintain an erection (erectile dysfunction). Feeling tired  (fatigue). Mood changes, like irritability or depression. Loss of muscle and body hair. Infertility. Large breasts. Weight gain (obesity). How is this diagnosed? Your health care provider can diagnose hypogonadism based on: Your signs and symptoms. A physical exam to check your testosterone  levels. This includes blood tests. Testosterone  levels can change throughout the day. Levels are highest in the morning. You may need to have repeat blood tests before getting a diagnosis of hypogonadism. Depending on your medical history and test results, your health care provider may also do other tests to find the cause of low testosterone . How is this treated? This condition is treated with testosterone  replacement therapy. Testosterone  can be given by: Injection or through pellets inserted under the skin. Gels or patches placed on the skin or in the mouth. Testosterone  therapy is not for everyone. It has risks and side effects. Your health care provider will consider your medical history, your risk for prostate cancer, your age, and your symptoms before putting you on testosterone  replacement therapy. Follow these instructions at home: Take over-the-counter and prescription medicines only as told by your health care provider. Eat foods that are high in fiber, such as beans, whole grains, and fresh fruits and vegetables. Limit foods that are high in fat and processed sugars, such as fried or sweet foods. If you drink alcohol: Limit how much you have to 0-2 drinks a day. Know how much alcohol is in your drink. In the [place] one drink equals one 12 oz bottle of beer (355 mL), one 5 oz glass of wine (148 mL), or one 1 oz glass of hard liquor (44 mL). Return to your normal activities as told by your health care provider. Ask your health care provider what activities are safe for you. Keep all  follow-up visits. This is important. Contact a health care provider if: You have any of the signs or symptoms of  low testosterone . You have any side effects from testosterone  therapy. Summary Male hypogonadism is a condition of having a level of testosterone  that is lower than normal. The natural drop in testosterone  production that occurs with age is the most common cause of this condition. Low testosterone  can also be caused by many diseases and conditions that affect the testicles and the making of testosterone . This condition is treated with testosterone  replacement therapy. There are risks and side effects of testosterone  therapy. Your health care provider will consider your age, medical history, symptoms, and risks for prostate cancer before putting you on testosterone  therapy. This information is not intended to replace advice given to you by your health care provider. Make sure you discuss any questions you have with your health care provider. Document Revised: 03/08/2023 Document Reviewed: 03/08/2023 Elsevier Patient Education  2025 [employer].

## 2023-12-09 DIAGNOSIS — G4733 Obstructive sleep apnea (adult) (pediatric): Secondary | ICD-10-CM | POA: Diagnosis not present

## 2023-12-14 DIAGNOSIS — M545 Low back pain, unspecified: Secondary | ICD-10-CM | POA: Diagnosis not present

## 2023-12-14 DIAGNOSIS — Z008 Encounter for other general examination: Secondary | ICD-10-CM | POA: Diagnosis not present

## 2023-12-14 DIAGNOSIS — Z6838 Body mass index (BMI) 38.0-38.9, adult: Secondary | ICD-10-CM | POA: Diagnosis not present

## 2023-12-14 DIAGNOSIS — N1831 Chronic kidney disease, stage 3a: Secondary | ICD-10-CM | POA: Diagnosis not present

## 2023-12-14 DIAGNOSIS — M1990 Unspecified osteoarthritis, unspecified site: Secondary | ICD-10-CM | POA: Diagnosis not present

## 2023-12-14 DIAGNOSIS — Z860100 Personal history of colon polyps, unspecified: Secondary | ICD-10-CM | POA: Diagnosis not present

## 2023-12-14 DIAGNOSIS — I130 Hypertensive heart and chronic kidney disease with heart failure and stage 1 through stage 4 chronic kidney disease, or unspecified chronic kidney disease: Secondary | ICD-10-CM | POA: Diagnosis not present

## 2023-12-14 DIAGNOSIS — I4891 Unspecified atrial fibrillation: Secondary | ICD-10-CM | POA: Diagnosis not present

## 2023-12-14 DIAGNOSIS — R7303 Prediabetes: Secondary | ICD-10-CM | POA: Diagnosis not present

## 2023-12-14 DIAGNOSIS — G4733 Obstructive sleep apnea (adult) (pediatric): Secondary | ICD-10-CM | POA: Diagnosis not present

## 2023-12-14 DIAGNOSIS — F17211 Nicotine dependence, cigarettes, in remission: Secondary | ICD-10-CM | POA: Diagnosis not present

## 2023-12-14 DIAGNOSIS — E785 Hyperlipidemia, unspecified: Secondary | ICD-10-CM | POA: Diagnosis not present

## 2023-12-14 DIAGNOSIS — I509 Heart failure, unspecified: Secondary | ICD-10-CM | POA: Diagnosis not present

## 2023-12-14 DIAGNOSIS — K219 Gastro-esophageal reflux disease without esophagitis: Secondary | ICD-10-CM | POA: Diagnosis not present

## 2023-12-16 ENCOUNTER — Ambulatory Visit

## 2023-12-17 DIAGNOSIS — M1611 Unilateral primary osteoarthritis, right hip: Secondary | ICD-10-CM | POA: Diagnosis not present

## 2023-12-17 DIAGNOSIS — Z20828 Contact with and (suspected) exposure to other viral communicable diseases: Secondary | ICD-10-CM | POA: Diagnosis not present

## 2023-12-17 DIAGNOSIS — M1711 Unilateral primary osteoarthritis, right knee: Secondary | ICD-10-CM | POA: Diagnosis not present

## 2023-12-17 DIAGNOSIS — I10 Essential (primary) hypertension: Secondary | ICD-10-CM | POA: Diagnosis not present

## 2023-12-17 DIAGNOSIS — Z6837 Body mass index (BMI) 37.0-37.9, adult: Secondary | ICD-10-CM | POA: Diagnosis not present

## 2023-12-17 DIAGNOSIS — F1120 Opioid dependence, uncomplicated: Secondary | ICD-10-CM | POA: Diagnosis not present

## 2023-12-17 DIAGNOSIS — E6609 Other obesity due to excess calories: Secondary | ICD-10-CM | POA: Diagnosis not present

## 2023-12-17 DIAGNOSIS — G894 Chronic pain syndrome: Secondary | ICD-10-CM | POA: Diagnosis not present

## 2023-12-17 DIAGNOSIS — I482 Chronic atrial fibrillation, unspecified: Secondary | ICD-10-CM | POA: Diagnosis not present

## 2023-12-17 DIAGNOSIS — R6889 Other general symptoms and signs: Secondary | ICD-10-CM | POA: Diagnosis not present

## 2023-12-20 DIAGNOSIS — M791 Myalgia, unspecified site: Secondary | ICD-10-CM | POA: Diagnosis not present

## 2023-12-20 DIAGNOSIS — R0981 Nasal congestion: Secondary | ICD-10-CM | POA: Diagnosis not present

## 2023-12-20 DIAGNOSIS — R059 Cough, unspecified: Secondary | ICD-10-CM | POA: Diagnosis not present

## 2023-12-20 DIAGNOSIS — U071 COVID-19: Secondary | ICD-10-CM | POA: Diagnosis not present

## 2023-12-22 ENCOUNTER — Ambulatory Visit

## 2023-12-29 ENCOUNTER — Ambulatory Visit

## 2023-12-29 DIAGNOSIS — E291 Testicular hypofunction: Secondary | ICD-10-CM

## 2023-12-29 MED ORDER — TESTOSTERONE CYPIONATE 200 MG/ML IM SOLN
200.0000 mg | Freq: Once | INTRAMUSCULAR | Status: AC
  Administered 2023-12-29: 200 mg via INTRAMUSCULAR

## 2023-12-29 NOTE — Progress Notes (Signed)
 Patient receiving Testosterone  injection per MD order  The injection site was cleaned and prepped with alcohol. A band aid applied after injection given.   IM injection  Medication: Testosterone  Dose: 250 ML/1.25 Location: left Anterior thigh Lot: [id number]  Exp: 02/11/2026  Patient tolerated well, no complications were noted  Performed by: Carlos, CMA

## 2023-12-30 ENCOUNTER — Ambulatory Visit

## 2024-01-12 ENCOUNTER — Ambulatory Visit

## 2024-01-12 ENCOUNTER — Ambulatory Visit (INDEPENDENT_AMBULATORY_CARE_PROVIDER_SITE_OTHER)

## 2024-01-12 DIAGNOSIS — E291 Testicular hypofunction: Secondary | ICD-10-CM

## 2024-01-12 MED ORDER — TESTOSTERONE CYPIONATE 200 MG/ML IM SOLN
200.0000 mg | INTRAMUSCULAR | Status: AC
  Administered 2024-01-12 – 2024-03-24 (×4): 200 mg via INTRAMUSCULAR

## 2024-01-12 NOTE — Progress Notes (Cosign Needed Addendum)
 Patient receiving Testosterone  injection per MD order  The injection site was cleaned and prepped with alcohol. A band aid applied after injection given.   IM injection  Medication: Testosterone  Dose: 1.25ML Location: right thigh Lot: [id number] Exp: 02/2026  Patient tolerated well, no complications were noted  Performed by: Carlos, CMA

## 2024-01-21 ENCOUNTER — Telehealth: Payer: Self-pay

## 2024-01-21 NOTE — Telephone Encounter (Signed)
 Testosteron Cypionate approved 01/12/2024-01/20/2025

## 2024-01-21 NOTE — Telephone Encounter (Signed)
 Medication prior authorization request received.  Completed PA request through cover my meds for drug testosterone . KEY: [id number]  Approved: Pending

## 2024-01-26 ENCOUNTER — Ambulatory Visit

## 2024-01-26 DIAGNOSIS — E291 Testicular hypofunction: Secondary | ICD-10-CM | POA: Diagnosis not present

## 2024-01-26 MED ORDER — TESTOSTERONE CYPIONATE 200 MG/ML IM SOLN: 200.0000 mg | Freq: Once | INTRAMUSCULAR | Status: DC

## 2024-01-26 NOTE — Progress Notes (Cosign Needed Addendum)
 Patient receiving Testosterone  injection per MD order  The injection site was cleaned and prepped with alcohol. A band aid applied after injection given.   IM injection  Medication: Testosterone  Dose: 1.25 ml Location: left thigh Lot: [id number] Exp: 02/2026  Patient tolerated well, no complications were noted  Performed by: Carlos, CMA

## 2024-01-28 ENCOUNTER — Other Ambulatory Visit: Payer: Self-pay | Admitting: Urology

## 2024-02-09 ENCOUNTER — Ambulatory Visit

## 2024-02-09 DIAGNOSIS — E291 Testicular hypofunction: Secondary | ICD-10-CM

## 2024-02-09 MED ORDER — TESTOSTERONE CYPIONATE 200 MG/ML IM SOLN
200.0000 mg | Freq: Once | INTRAMUSCULAR | Status: AC
  Administered 2024-02-09: 200 mg via INTRAMUSCULAR

## 2024-02-09 NOTE — Progress Notes (Addendum)
 Patient receiving Testosterone   injection per MD order  The injection site was cleaned and prepped with alcohol. A band aid applied after injection given.   IM injection  Medication: Testosterone  Dose: 1.25 MG Location: right Thigh Lot: [id number] Exp: 02/2026  Patient tolerated well, no complications were noted  Performed by: Carlos, CMA

## 2024-02-14 ENCOUNTER — Encounter: Payer: Self-pay | Admitting: Radiology

## 2024-02-25 ENCOUNTER — Ambulatory Visit (INDEPENDENT_AMBULATORY_CARE_PROVIDER_SITE_OTHER)

## 2024-02-25 DIAGNOSIS — E291 Testicular hypofunction: Secondary | ICD-10-CM

## 2024-02-25 MED ORDER — TESTOSTERONE CYPIONATE 200 MG/ML IM SOLN: 200.0000 mg | Freq: Once | INTRAMUSCULAR | Status: DC

## 2024-02-25 NOTE — Progress Notes (Signed)
 Patient receiving testosterone  injection per MD order  The injection site was cleaned and prepped with alcohol. A band aid applied after injection given.   testosterone  injection  Medication: Testosterone  Dose: 1.25mg  Location: left anterior thigh Lot: [id number] Exp: 02/11/2026  Patient tolerated well, no complications were noted  Performed by: Exie DASEN. CMA

## 2024-02-25 NOTE — Addendum Note (Signed)
 Addended by: SAMMIE EXIE HERO on: 02/25/2024 11:27 AM   Modules accepted: Level of Service

## 2024-02-27 ENCOUNTER — Other Ambulatory Visit: Payer: Self-pay | Admitting: Internal Medicine

## 2024-03-03 ENCOUNTER — Ambulatory Visit

## 2024-03-03 DIAGNOSIS — E291 Testicular hypofunction: Secondary | ICD-10-CM

## 2024-03-03 MED ORDER — TESTOSTERONE CYPIONATE 200 MG/ML IM SOLN
200.0000 mg | Freq: Once | INTRAMUSCULAR | Status: AC
  Administered 2024-03-03: 200 mg via INTRAMUSCULAR

## 2024-03-03 NOTE — Progress Notes (Signed)
 Patient receiving Testosterone  injection per MD order  The injection site was cleaned and prepped with alcohol. A band aid applied after injection given.   IM injection  Medication: Testosterone  Dose: 200 MG Location: right Anterior Thigh Lot: [id number] Exp: 02/2026  Patient tolerated well, no complications were noted  Performed by: Carlos, CMA

## 2024-03-06 ENCOUNTER — Encounter: Payer: Self-pay | Admitting: Internal Medicine

## 2024-03-17 ENCOUNTER — Ambulatory Visit

## 2024-03-24 ENCOUNTER — Ambulatory Visit

## 2024-03-24 DIAGNOSIS — E291 Testicular hypofunction: Secondary | ICD-10-CM

## 2024-03-24 NOTE — Progress Notes (Signed)
 Patient receiving Testosterone  injection per MD order  The injection site was cleaned and prepped with alcohol. A band aid applied after injection given.   IM injection  Medication: Testosterone  Dose: 1.62mL Location: left anterior thigh  Lot: [id number] Exp: 02/11/2026  Patient tolerated well, no complications were noted  Performed by: Exie T. CMA

## 2024-03-27 ENCOUNTER — Other Ambulatory Visit (HOSPITAL_COMMUNITY): Payer: Self-pay | Admitting: Nephrology

## 2024-03-27 DIAGNOSIS — R8290 Unspecified abnormal findings in urine: Secondary | ICD-10-CM

## 2024-03-27 DIAGNOSIS — N1832 Chronic kidney disease, stage 3b: Secondary | ICD-10-CM

## 2024-03-27 DIAGNOSIS — I129 Hypertensive chronic kidney disease with stage 1 through stage 4 chronic kidney disease, or unspecified chronic kidney disease: Secondary | ICD-10-CM

## 2024-03-31 ENCOUNTER — Ambulatory Visit

## 2024-03-31 ENCOUNTER — Ambulatory Visit (HOSPITAL_COMMUNITY)
Admission: RE | Admit: 2024-03-31 | Discharge: 2024-03-31 | Disposition: A | Discharge status: Home / Self Care | Source: Ambulatory Visit | Attending: Nephrology | Admitting: Nephrology

## 2024-03-31 DIAGNOSIS — N1832 Chronic kidney disease, stage 3b: Secondary | ICD-10-CM | POA: Insufficient documentation

## 2024-03-31 DIAGNOSIS — I129 Hypertensive chronic kidney disease with stage 1 through stage 4 chronic kidney disease, or unspecified chronic kidney disease: Secondary | ICD-10-CM | POA: Insufficient documentation

## 2024-03-31 DIAGNOSIS — R8290 Unspecified abnormal findings in urine: Secondary | ICD-10-CM | POA: Diagnosis present

## 2024-04-08 ENCOUNTER — Other Ambulatory Visit: Payer: Self-pay | Admitting: Urology

## 2024-04-10 ENCOUNTER — Ambulatory Visit

## 2024-04-10 DIAGNOSIS — E291 Testicular hypofunction: Secondary | ICD-10-CM | POA: Diagnosis not present

## 2024-04-10 MED ORDER — TESTOSTERONE CYPIONATE 200 MG/ML IM SOLN
200.0000 mg | INTRAMUSCULAR | Status: AC
  Administered 2024-04-10 – 2024-04-25 (×2): 200 mg via INTRAMUSCULAR

## 2024-04-10 NOTE — Progress Notes (Signed)
 Patient receiving Testosterone  injection per MD order  The injection site was cleaned and prepped with alcohol. A band aid applied after injection given.   IM injection  Medication: Testosterone   Dose: 200 MG Location: left Anterior Thigh Lot: [id number]  Exp: 04/2026  Patient tolerated well, no complications were noted  Performed by: Carlos, CMA

## 2024-04-17 ENCOUNTER — Ambulatory Visit

## 2024-04-24 ENCOUNTER — Ambulatory Visit: Payer: Self-pay

## 2024-04-25 ENCOUNTER — Encounter: Payer: Self-pay | Admitting: Internal Medicine

## 2024-04-25 ENCOUNTER — Ambulatory Visit

## 2024-04-25 ENCOUNTER — Ambulatory Visit: Admitting: Internal Medicine

## 2024-04-25 VITALS — BP 114/73 | HR 75 | Temp 97.8°F | Ht 66.0 in | Wt 244.2 lb

## 2024-04-25 DIAGNOSIS — K219 Gastro-esophageal reflux disease without esophagitis: Secondary | ICD-10-CM | POA: Diagnosis not present

## 2024-04-25 DIAGNOSIS — R1319 Other dysphagia: Secondary | ICD-10-CM

## 2024-04-25 DIAGNOSIS — E291 Testicular hypofunction: Secondary | ICD-10-CM

## 2024-04-25 DIAGNOSIS — K5900 Constipation, unspecified: Secondary | ICD-10-CM

## 2024-04-25 DIAGNOSIS — Z8719 Personal history of other diseases of the digestive system: Secondary | ICD-10-CM

## 2024-04-25 DIAGNOSIS — R1310 Dysphagia, unspecified: Secondary | ICD-10-CM | POA: Diagnosis not present

## 2024-04-25 DIAGNOSIS — Z860101 Personal history of adenomatous and serrated colon polyps: Secondary | ICD-10-CM

## 2024-04-25 MED ORDER — PANTOPRAZOLE SODIUM 40 MG PO TBEC: 40.0000 mg | DELAYED_RELEASE_TABLET | Freq: Every day | ORAL | 3 refills | Status: AC

## 2024-04-25 MED ORDER — TESTOSTERONE CYPIONATE 200 MG/ML IM SOLN: 200.0000 mg | Freq: Once | INTRAMUSCULAR | Status: AC

## 2024-04-25 NOTE — Progress Notes (Unsigned)
 "   Gastroenterology Progress Note    Primary Care Physician:  Leonce Lucie PARAS, PA-C Primary Gastroenterologist:  Dr. Shaaron  Pre-Procedure History & Physical: HPI:  Calvin Henry is a 64 y.o. male here for follow-up GERD and dysphagia Schatzki ring previously previously dilated.  No H. pylori on 2024 gastric biopsies.  Recently treated.  Serrated polyp on colonoscopy 2024; due for surveillance 2031.  Reflux well-controlled on Protonix  40 mg daily.  Past Medical History:  Diagnosis Date   A-fib (HCC)    Acid reflux    Arthritis    Atrial flutter (HCC)    a. diagnosed in 02/2017 with spontaneous conversion back to NSR.    CAD (coronary artery disease)    Chronic back pain    CKD (chronic kidney disease) stage 3, GFR 30-59 ml/min (HCC)    Essential hypertension    H. pylori infection    Treated with Prevpac   Hemorrhoids    PSVT (paroxysmal supraventricular tachycardia)    a. s/p ablation in 12/2016   Restrictive cardiomyopathy (HCC)    Tubular adenoma     Past Surgical History:  Procedure Laterality Date   BIOPSY  11/26/2022   Procedure: BIOPSY;  Surgeon: Shaaron Lamar HERO, MD;  [facility];  Service: Endoscopy;;   COLONOSCOPY  08/07/10   friable anal canal otherwise normal   COLONOSCOPY [name] 03/18/2017   Procedure: COLONOSCOPY;  Surgeon: Shaaron Lamar HERO, MD;  [facility]: [facility];  Service: Endoscopy;  Laterality: [name];  1:15pm   COLONOSCOPY WITH ESOPHAGOGASTRODUODENOSCOPY (EGD) [name] 07/06/2013   Dr. Shaaron- inflamed hemorrhoids- multiple colonic polyps= tubular adenoma. stomach bx= hpylori treated with prevpac   COLONOSCOPY WITH PROPOFOL  [name] 11/26/2022   Procedure: COLONOSCOPY WITH PROPOFOL ;  Surgeon: Shaaron Lamar HERO, MD;  [facility];  Service: Endoscopy;  Laterality: [name];  10:00am, asa 3   ESOPHAGOGASTRODUODENOSCOPY  08/07/10   schatzkis ring otherwise normal, s/p 56-F dilation, small hiatal hernia.chronic active gastritis on bx    ESOPHAGOGASTRODUODENOSCOPY (EGD) WITH PROPOFOL  [name] 11/26/2022   Procedure: ESOPHAGOGASTRODUODENOSCOPY (EGD) WITH PROPOFOL ;  Surgeon: Shaaron Lamar HERO, MD;  [facility];  Service: Endoscopy;  Laterality: [name];   HEMORRHOID BANDING     HERNIA REPAIR     HYDROCELE EXCISION Bilateral 08/20/2022   Procedure: HYDROCELECTOMY ADULT;  Surgeon: Sherrilee Belvie CROME, MD;  Location: [facility];  Service: Urology;  Laterality: Bilateral;   MALONEY DILATION [name] 11/26/2022   Procedure: AGAPITO DILATION;  Surgeon: Shaaron Lamar HERO, MD;  [facility];  Service: Endoscopy;  Laterality: [name];   POLYPECTOMY  11/26/2022   Procedure: POLYPECTOMY;  Surgeon: Shaaron Lamar HERO, MD;  [facility];  Service: Endoscopy;;   SVT ABLATION [name] 12/15/2016   Procedure: SVT Ablation;  Surgeon: Kelsie Agent, MD;  [facility];  Service: Cardiovascular;  Laterality: [name];   TOTAL HIP ARTHROPLASTY Left 10/04/2015   Procedure: LEFT TOTAL HIP ARTHROPLASTY ANTERIOR APPROACH;  Surgeon: Lonni CINDERELLA Poli, MD;  Location: [facility];  Service: Orthopedics;  Laterality: Left;   TOTAL HIP ARTHROPLASTY Right 04/20/2023   Procedure: TOTAL HIP ARTHROPLASTY;  Surgeon: Beverley Evalene BIRCH, MD;  Location: [facility];  Service: Orthopedics;  Laterality: Right;   UMBILICAL HERNIA REPAIR  08/05/2011   Procedure: HERNIA REPAIR UMBILICAL ADULT;  Surgeon: Oneil DELENA Budge, MD;  Location: [facility];  Service: General;  Laterality: [name];    Prior to Admission medications  Medication Sig Start Date End Date Taking? Authorizing Provider  allopurinol  (ZYLOPRIM ) 100 MG tablet Take 100 mg by mouth daily. 07/20/22  Yes [provider]  amLODipine  (NORVASC ) 5 MG tablet Take 5 mg by mouth daily. 12/19/21  Yes [provider]  anastrozole  (ARIMIDEX ) 1 MG tablet TAKE 1/2 TABLET BY MOUTH DAILY 04/17/24  Yes McKenzie, Belvie CROME, MD  atorvastatin  (LIPITOR) 80 MG tablet Take 80 mg by mouth daily. 09/17/20  Yes [provider]   chlorthalidone (HYGROTON) 25 MG tablet Take 12.5 mg by mouth every Monday, Wednesday, and Friday.   Yes [provider]  Cholecalciferol (VITAMIN D3 SUPER STRENGTH) 50 MCG (2000 UT) CAPS Take 1 capsule by mouth daily.   Yes [provider]  nebivolol (BYSTOLIC) 10 MG tablet Take 10 mg by mouth daily.   Yes [provider]  NEEDLE, DISP, 18 G 18G X 1-1/2 MISC Use to draw up testosterone  12/29/22  Yes McKenzie, Belvie CROME, MD  oxyCODONE -acetaminophen  (PERCOCET) 10-325 MG tablet Take 1 tablet by mouth 4 (four) times daily as needed for pain.   Yes [provider]  pantoprazole  (PROTONIX ) 40 MG tablet TAKE 1 TABLET BY MOUTH EVERY DAY 02/28/24  Yes Haelee Bolen, Lamar HERO, MD  Semaglutide,0.25 or 0.5MG /DOS, (OZEMPIC, 0.25 OR 0.5 MG/DOSE,) 2 MG/3ML SOPN Inject 0.25 mg into the skin once a week.   Yes [provider]  SYRINGE-NEEDLE, DISP, 3 ML 22G X 1-1/2 3 ML MISC Use to inject testosterone  12/29/22  Yes McKenzie, Belvie CROME, MD  testosterone  cypionate (DEPOTESTOSTERONE CYPIONATE) 200 MG/ML injection INJECT 1.25 MLS INTO THE MUSCLE EVERY 14 DAYS. DISCARD THE REMAINDER AFTER 28 DAYS 02/01/24  Yes McKenzie, Belvie CROME, MD  warfarin (COUMADIN) 5 MG tablet Take 5 mg by mouth daily at 4 PM. 08/25/23  Yes [provider]    Allergies as of 04/25/2024 - Review Complete 04/25/2024  Allergen Reaction Noted   Celecoxib Dermatitis 04/25/2024    Family History  Problem Relation Age of Onset   Diabetes Mother    Hypertension Mother    Colon cancer Neg Hx     Social History   Socioeconomic History   Marital status: Divorced    Spouse name: Not on file   Number of children: Not on file   Years of education: Not on file   Highest education level: Not on file  Occupational History   Not on file  Tobacco Use   Smoking status: Former    Current packs/day: 0.00    Average packs/day: 0.5 packs/day for 10.0 years (5.0 ttl pk-yrs)    Types: Cigars, Cigarettes     Start date: 11/30/1976    Quit date: 12/01/1986    Years since quitting: 37.4   Smokeless tobacco: Never   Tobacco comments:    Quit x 20 plus years  Vaping Use   Vaping status: Never Used  Substance and Sexual Activity   Alcohol use: Not Currently   Drug use: No    Comment: remote marijuana use    Sexual activity: Yes  Other Topics Concern   Not on file  Social History Narrative   ** Merged History Encounter **       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: Sometimes true    Within the past 12 months, the food you bought just didn't last and you didn't have money to get more.: Sometimes true  Transportation Needs: No Transportation Needs (05/05/2023)   Received from [facility] - Transportation    Lack of Transportation (Medical): No    Lack of Transportation (Non-Medical): No  Physical Activity: Inactive (10/19/2021)   Received from [facility]   Exercise Vital Sign    On average, how many days per week do you engage in moderate to strenuous exercise (like a brisk walk)?: 0 days    On average, how many minutes do you engage in exercise at this level?: 0 min  Stress: No Stress Concern Present (10/19/2021)   Received from [facility] - Occupational Stress Questionnaire    Feeling of Stress : Not at all  Social Connections: Socially Integrated (10/19/2021)    Received from [facility]   Social Network    How would you rate your social network (family, work, friends)?: Good participation with social networks  Intimate Partner Violence: Not At Risk (04/20/2023)   Humiliation, Afraid, Rape, and Kick questionnaire    Fear of Current or Ex-Partner: No    Emotionally Abused: No    Physically Abused: No    Sexually Abused: No  Depression (PHQ2-9): Not on file  Alcohol Screen: Not on file  Housing: Low Risk (04/20/2023)   Housing Stability Vital Sign    Unable to Pay for Housing in the Last Year: No    Number of Times Moved in the Last Year: 0    Homeless in the Last Year: No  Utilities: Low Risk (05/05/2023)   Received from [facility]   Utilities    Within the past 12 months, have you been unable to get utilities(heat, electricity) when it was really needed?: No  Health Literacy: Medium Risk (04/21/2022)   Received from [facility]   Health Literacy    : Rarely    Review of Systems   See HPI, otherwise negative ROS  Physical Exam: BP 114/73   Pulse 75   Temp 97.8 F (36.6 C) (Oral)   Ht 5' 6 (1.676 m)   Wt 244 lb 3.2 oz (110.8 kg)   SpO2 95%   BMI 39.41 kg/m  General:   Alert,  Well-developed, well-nourished, pleasant and cooperative in NAD Neck:  Supple; no masses or thyromegaly. No significant cervical adenopathy. Lungs:  Clear throughout to auscultation.   No wheezes, crackles, or rhonchi. No acute distress. Heart:  Regular rate and rhythm; no murmurs, clicks, rubs,  or gallops. Abdomen: Non-distended, normal bowel sounds.  Soft and nontender without appreciable mass or hepatosplenomegaly.  Impression/Plan:   64 year old obese gentleman with GERD dysphagia history of Schatzki's ring dilated to years ago.  Clinically, doing well from a reflux standpoint on Protonix  40 mg daily.  Patient likely drug-induced without alarm features.    Recommendations:  Continue pantoprazole  Protonix  40 mg daily best taken 30 minutes  before breakfast  - new prescription dispense 90 with 3 refills.  Utilize MiraLAX  1 capful of powder in 8  ounces of water  every night take this every night to improve the bowel function her goal is a least 1 bowel movement every other day or 3 times a week.  Call me in a month and let me know how you are doing with your bowels.  Recommendations may need adjustment depending on your progress.  Recommend repeat colonoscopy in 2031.  Office visit with me in 3 months.  Notice: This dictation was prepared with Dragon dictation along with smaller phrase technology. Any transcriptional errors that result from this process are unintentional and may not be corrected upon review.  "

## 2024-04-25 NOTE — Progress Notes (Signed)
 Patient receiving Testosterone  injection per MD order  The injection site was cleaned and prepped with alcohol. A band aid applied after injection given.   IM injection  Medication: Testosterone   Dose: 200 MG Location: right Anterior thigh Lot: [id number] Exp: 04/2026  Patient tolerated well, no complications were noted  Performed by: Carlos, CMA

## 2024-04-25 NOTE — Patient Instructions (Signed)
 It was good to see you again today  Continue pantoprazole  Protonix  40 mg daily best taken 30 minutes before breakfast  - new prescription dispense 90 with 3 refills.  Utilize MiraLAX  1 capful of powder in 8 ounces of water  every night take this every night to improve the bowel function her goal is a least 1 bowel movement every other day or 3 times a week.  Call me in a month and let me know how you are doing with your bowels.  Recommendations may need adjustment depending on your progress.  Recommend repeat colonoscopy in 2031.  Office visit with me in 3 months.

## 2024-05-01 ENCOUNTER — Ambulatory Visit: Payer: Self-pay

## 2024-05-09 ENCOUNTER — Telehealth: Payer: Self-pay

## 2024-05-09 NOTE — Telephone Encounter (Signed)
 Patient called to clarify how he should be taking his anastrozole  patient advised per script he is to be taking 1/2 tablet daily patient stated he was unaware and had been taking 1/2 tablet every few weeks patient advised that MD wanted him to continue taking 1/2 tablet daily in August patient made aware and voiced his understanding

## 2024-05-12 ENCOUNTER — Ambulatory Visit

## 2024-05-12 DIAGNOSIS — E291 Testicular hypofunction: Secondary | ICD-10-CM

## 2024-05-12 MED ORDER — TESTOSTERONE CYPIONATE 200 MG/ML IM SOLN
200.0000 mg | Freq: Once | INTRAMUSCULAR | Status: AC
  Administered 2024-05-12: 200 mg via INTRAMUSCULAR

## 2024-05-12 NOTE — Progress Notes (Signed)
 Patient receiving Testosterone   injection per MD order  The injection site was cleaned and prepped with alcohol. A band aid applied after injection given.   IM injection  Medication: Testosterone  Dose: 200mg  Location: left Anterior Thigh Lot: [id number] Exp: 04/2026  Patient tolerated well, no complications were noted  Performed by: Carlos, CMA

## 2024-05-26 ENCOUNTER — Ambulatory Visit

## 2024-06-09 ENCOUNTER — Ambulatory Visit: Admitting: Urology
